# Patient Record
Sex: Female | Born: 1990 | Race: Black or African American | Hispanic: No | Smoking: Former smoker
Health system: Southern US, Community
[De-identification: ages and names within clinical notes are randomized; demographics above are authoritative.]

## PROBLEM LIST (undated history)

## (undated) ENCOUNTER — Emergency Department: Admission: EM | Payer: BLUE CROSS/BLUE SHIELD

## (undated) ENCOUNTER — Inpatient Hospital Stay (HOSPITAL_COMMUNITY): Payer: Self-pay

## (undated) DIAGNOSIS — A749 Chlamydial infection, unspecified: Secondary | ICD-10-CM

## (undated) DIAGNOSIS — O24419 Gestational diabetes mellitus in pregnancy, unspecified control: Secondary | ICD-10-CM

## (undated) DIAGNOSIS — E559 Vitamin D deficiency, unspecified: Secondary | ICD-10-CM

## (undated) DIAGNOSIS — N760 Acute vaginitis: Secondary | ICD-10-CM

## (undated) DIAGNOSIS — N189 Chronic kidney disease, unspecified: Secondary | ICD-10-CM

## (undated) DIAGNOSIS — K219 Gastro-esophageal reflux disease without esophagitis: Secondary | ICD-10-CM

## (undated) DIAGNOSIS — D649 Anemia, unspecified: Secondary | ICD-10-CM

## (undated) DIAGNOSIS — Z8489 Family history of other specified conditions: Secondary | ICD-10-CM

## (undated) DIAGNOSIS — N915 Oligomenorrhea, unspecified: Secondary | ICD-10-CM

## (undated) DIAGNOSIS — B9689 Other specified bacterial agents as the cause of diseases classified elsewhere: Secondary | ICD-10-CM

## (undated) DIAGNOSIS — Z23 Encounter for immunization: Secondary | ICD-10-CM

## (undated) DIAGNOSIS — Z5189 Encounter for other specified aftercare: Secondary | ICD-10-CM

## (undated) DIAGNOSIS — E282 Polycystic ovarian syndrome: Secondary | ICD-10-CM

## (undated) DIAGNOSIS — E785 Hyperlipidemia, unspecified: Secondary | ICD-10-CM

## (undated) DIAGNOSIS — N926 Irregular menstruation, unspecified: Secondary | ICD-10-CM

## (undated) HISTORY — DX: Gastro-esophageal reflux disease without esophagitis: K21.9

## (undated) HISTORY — DX: Irregular menstruation, unspecified: N92.6

## (undated) HISTORY — DX: Chlamydial infection, unspecified: A74.9

## (undated) HISTORY — DX: Hyperlipidemia, unspecified: E78.5

## (undated) HISTORY — DX: Acute vaginitis: N76.0

## (undated) HISTORY — DX: Oligomenorrhea, unspecified: N91.5

## (undated) HISTORY — DX: Encounter for immunization: Z23

## (undated) HISTORY — DX: Vitamin D deficiency, unspecified: E55.9

## (undated) HISTORY — DX: Other specified bacterial agents as the cause of diseases classified elsewhere: B96.89

## (undated) HISTORY — DX: Encounter for other specified aftercare: Z51.89

---
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
## 2009-05-01 ENCOUNTER — Emergency Department: Payer: Self-pay | Admitting: Unknown Physician Specialty

## 2010-04-29 LAB — HM PAP SMEAR: HM Pap smear: NORMAL

## 2010-12-12 ENCOUNTER — Emergency Department: Payer: Self-pay | Admitting: Internal Medicine

## 2013-09-16 ENCOUNTER — Encounter (HOSPITAL_COMMUNITY): Payer: Self-pay

## 2013-09-16 ENCOUNTER — Inpatient Hospital Stay (HOSPITAL_COMMUNITY)
Admission: AD | Admit: 2013-09-16 | Discharge: 2013-09-16 | Disposition: A | Discharge status: Home / Self Care | Payer: 59 | Source: Ambulatory Visit | Attending: Obstetrics & Gynecology | Admitting: Obstetrics & Gynecology

## 2013-09-16 DIAGNOSIS — Z3201 Encounter for pregnancy test, result positive: Secondary | ICD-10-CM | POA: Insufficient documentation

## 2013-09-16 LAB — POCT PREGNANCY, URINE: Preg Test, Ur: POSITIVE — AB

## 2013-09-16 NOTE — MAU Provider Note (Signed)
Attestation of Attending Supervision of Advanced Practitioner (CNM/NP): Evaluation and management procedures were performed by the Advanced Practitioner under my supervision and collaboration.  I have reviewed the Advanced Practitioner's note and chart, and I agree with the management and plan.  HARRAWAY-SMITH, Artist Bloom 2:49 PM

## 2013-09-16 NOTE — MAU Provider Note (Signed)
Ms. Susan Hubbard is a 23 y.o. G1P0 at [redacted]w[redacted]d who presents to MAU today for confirmation of pregnancy. The patient reports +HPT and plans to go to prenatal care in [place]. The patient denies abdominal pain, bleeding or fever today.   BP 117/61  Pulse 79  Temp(Src) 98.7 F (37.1 C) (Oral)  Resp 16  Ht 5\' 3"  (1.6 m)  Wt 184 lb 3.2 oz (83.553 kg)  BMI 32.64 kg/m2  SpO2 100%  LMP 07/21/2013 GENERAL: Well-developed, well-nourished female in no acute distress.  HEENT: Normocephalic, atraumatic.   LUNGS: Effort normal HEART: Regular rate  SKIN: Warm, dry and without erythema PSYCH: Normal mood and affect  Results for orders placed during the hospital encounter of 09/16/13 (from the past 24 hour(s))  POCT PREGNANCY, URINE     Status: Abnormal   Collection Time    09/16/13 12:13 PM      Result Value Ref Range   Preg Test, Ur POSITIVE (*) NEGATIVE    A: Positive pregnancy test  P: Discharge home Bleeding/Ectopic warning signs discussed Patient advised to call OB of choice to start care ASAP Patient may return to MAU as needed or if her condition were to change or worsen  Farris Has, PA-C 09/16/2013 12:50 PM

## 2013-09-16 NOTE — MAU Note (Signed)
Patient states she has had a positive home pregnancy test and wants to have a confirmation. Denies any pain, bleeding, nausea and vomiting.

## 2013-09-16 NOTE — Discharge Instructions (Signed)
Pregnancy, First Trimester The first trimester is the first 3 months your baby is growing inside you. It is important to follow your doctor's instructions. HOME CARE   Do not smoke.  Do not drink alcohol.  Only take medicine as told by your doctor.  Exercise.  Eat healthy foods. Eat regular, well-balanced meals.  You can have sex (intercourse) if there are no other problems with the pregnancy.  Things that help with morning sickness:  Eat soda crackers before getting up in the morning.  Eat 4 to 5 small meals rather than 3 large meals.  Drink liquids between meals, not during meals.  Go to all appointments as told.  Take all vitamins or supplements as told by your doctor. GET HELP RIGHT AWAY IF:   You develop a fever.  You have a bad smelling fluid that is leaking from your vagina.  There is bleeding from the vagina.  You develop severe belly (abdominal) or back pain.  You throw up (vomit) blood. It may look like coffee grounds.  You lose more than 2 pounds in a week.  You gain 5 pounds or more in a week.  You gain more than 2 pounds in a week and you see puffiness (swelling) in your feet, ankles, or legs.  You have severe dizziness or pass out (faint).  You are around people who have [place] measles, chickenpox, or fifth disease.  You have a headache, watery poop (diarrhea), pain with peeing (urinating), or cannot breath right. Document Released: 12/21/2007 Document Revised: 09/26/2011 Document Reviewed: 12/21/2007 [facility] Patient Information 2014 [place], [place].

## 2013-09-23 ENCOUNTER — Emergency Department: Payer: Self-pay | Admitting: Emergency Medicine

## 2013-09-23 LAB — URINALYSIS, COMPLETE
Bacteria: NONE SEEN
Bilirubin,UR: NEGATIVE
Glucose,UR: NEGATIVE mg/dL (ref 0–75)
KETONE: NEGATIVE
Leukocyte Esterase: NEGATIVE
Nitrite: NEGATIVE
PH: 5 (ref 4.5–8.0)
PROTEIN: NEGATIVE
RBC,UR: 7 /HPF (ref 0–5)
Specific Gravity: 1.026 (ref 1.003–1.030)
Squamous Epithelial: 8
WBC UR: 1 /HPF (ref 0–5)

## 2013-09-23 LAB — CBC
HCT: 38.2 % (ref 35.0–47.0)
HGB: 12.1 g/dL (ref 12.0–16.0)
MCH: 28.2 pg (ref 26.0–34.0)
MCHC: 31.6 g/dL — ABNORMAL LOW (ref 32.0–36.0)
MCV: 90 fL (ref 80–100)
PLATELETS: 331 10*3/uL (ref 150–440)
RBC: 4.27 10*6/uL (ref 3.80–5.20)
RDW: 13.1 % (ref 11.5–14.5)
WBC: 11.3 10*3/uL — ABNORMAL HIGH (ref 3.6–11.0)

## 2013-09-23 LAB — HCG, QUANTITATIVE, PREGNANCY: BETA HCG, QUANT.: 119750 m[IU]/mL — AB

## 2013-10-05 ENCOUNTER — Emergency Department: Payer: Self-pay | Admitting: Internal Medicine

## 2013-10-05 LAB — URINALYSIS, COMPLETE
BILIRUBIN, UR: NEGATIVE
Glucose,UR: NEGATIVE mg/dL (ref 0–75)
KETONE: NEGATIVE
Leukocyte Esterase: NEGATIVE
NITRITE: NEGATIVE
Ph: 5 (ref 4.5–8.0)
Protein: NEGATIVE
Specific Gravity: 1.023 (ref 1.003–1.030)
Squamous Epithelial: 15
WBC UR: 2 /HPF (ref 0–5)

## 2013-10-05 LAB — HCG, QUANTITATIVE, PREGNANCY: Beta Hcg, Quant.: 103583 m[IU]/mL — ABNORMAL HIGH

## 2013-10-05 LAB — CBC WITH DIFFERENTIAL/PLATELET
Basophil #: 0.1 10*3/uL (ref 0.0–0.1)
Basophil %: 0.4 %
EOS ABS: 0.1 10*3/uL (ref 0.0–0.7)
Eosinophil %: 0.5 %
HCT: 35.5 % (ref 35.0–47.0)
HGB: 12 g/dL (ref 12.0–16.0)
LYMPHS PCT: 12.6 %
Lymphocyte #: 1.6 10*3/uL (ref 1.0–3.6)
MCH: 29.9 pg (ref 26.0–34.0)
MCHC: 33.9 g/dL (ref 32.0–36.0)
MCV: 88 fL (ref 80–100)
Monocyte #: 0.8 x10 3/mm (ref 0.2–0.9)
Monocyte %: 6.1 %
NEUTROS PCT: 80.4 %
Neutrophil #: 10.2 10*3/uL — ABNORMAL HIGH (ref 1.4–6.5)
Platelet: 263 10*3/uL (ref 150–440)
RBC: 4.02 10*6/uL (ref 3.80–5.20)
RDW: 13.1 % (ref 11.5–14.5)
WBC: 12.6 10*3/uL — ABNORMAL HIGH (ref 3.6–11.0)

## 2014-01-06 ENCOUNTER — Encounter (HOSPITAL_COMMUNITY): Payer: Self-pay

## 2014-03-03 ENCOUNTER — Observation Stay: Payer: Self-pay | Admitting: Obstetrics and Gynecology

## 2014-03-03 LAB — URINALYSIS, COMPLETE
BILIRUBIN, UR: NEGATIVE
Glucose,UR: NEGATIVE mg/dL (ref 0–75)
KETONE: NEGATIVE
Leukocyte Esterase: NEGATIVE
Nitrite: NEGATIVE
Ph: 5 (ref 4.5–8.0)
Protein: NEGATIVE
RBC,UR: 6 /HPF (ref 0–5)
Specific Gravity: 1.031 (ref 1.003–1.030)
Squamous Epithelial: 4
WBC UR: 2 /HPF (ref 0–5)

## 2014-03-04 LAB — FETAL FIBRONECTIN
Appearance: NORMAL
Fetal Fibronectin: NEGATIVE

## 2014-03-05 ENCOUNTER — Observation Stay: Payer: Self-pay

## 2014-03-05 LAB — URINALYSIS, COMPLETE
Bilirubin,UR: NEGATIVE
Blood: NEGATIVE
Glucose,UR: NEGATIVE mg/dL (ref 0–75)
Ketone: NEGATIVE
Leukocyte Esterase: NEGATIVE
Nitrite: NEGATIVE
Ph: 6 (ref 4.5–8.0)
Protein: 30
RBC,UR: 3 /HPF (ref 0–5)
Specific Gravity: 1.026 (ref 1.003–1.030)
Squamous Epithelial: 17
WBC UR: 3 /HPF (ref 0–5)

## 2014-03-05 LAB — URINE CULTURE

## 2014-03-10 ENCOUNTER — Observation Stay: Payer: Self-pay | Admitting: Certified Nurse Midwife

## 2014-03-10 LAB — URINALYSIS, COMPLETE
BLOOD: NEGATIVE
Bilirubin,UR: NEGATIVE
Glucose,UR: NEGATIVE mg/dL (ref 0–75)
Ketone: NEGATIVE
LEUKOCYTE ESTERASE: NEGATIVE
NITRITE: NEGATIVE
Ph: 5 (ref 4.5–8.0)
Protein: 30
SPECIFIC GRAVITY: 1.027 (ref 1.003–1.030)
WBC UR: 3 /HPF (ref 0–5)

## 2014-03-14 LAB — URINE CULTURE

## 2014-04-01 ENCOUNTER — Observation Stay: Payer: Self-pay | Admitting: Obstetrics and Gynecology

## 2014-04-01 LAB — CBC WITH DIFFERENTIAL/PLATELET
COMMENT - H1-COM1: NORMAL
Comment - H1-Com2: NORMAL
HCT: 38.2 % (ref 35.0–47.0)
HGB: 12.4 g/dL (ref 12.0–16.0)
Lymphocytes: 22 %
MCH: 29 pg (ref 26.0–34.0)
MCHC: 32.6 g/dL (ref 32.0–36.0)
MCV: 89 fL (ref 80–100)
Monocytes: 4 %
PLATELETS: 272 10*3/uL (ref 150–440)
RBC: 4.29 10*6/uL (ref 3.80–5.20)
RDW: 13.1 % (ref 11.5–14.5)
Segmented Neutrophils: 74 %
WBC: 8.7 10*3/uL (ref 3.6–11.0)

## 2014-04-01 LAB — URINALYSIS, COMPLETE
BACTERIA: NONE SEEN
Bilirubin,UR: NEGATIVE
Blood: NEGATIVE
Glucose,UR: NEGATIVE mg/dL (ref 0–75)
KETONE: NEGATIVE
LEUKOCYTE ESTERASE: NEGATIVE
Nitrite: NEGATIVE
PH: 8 (ref 4.5–8.0)
Protein: NEGATIVE
RBC,UR: NONE SEEN /HPF (ref 0–5)
Specific Gravity: 1.012 (ref 1.003–1.030)

## 2014-04-12 ENCOUNTER — Observation Stay: Payer: Self-pay

## 2014-04-12 LAB — CBC WITH DIFFERENTIAL/PLATELET
BASOS PCT: 0.1 %
Basophil #: 0 10*3/uL (ref 0.0–0.1)
EOS ABS: 0 10*3/uL (ref 0.0–0.7)
EOS PCT: 0.4 %
HCT: 35.8 % (ref 35.0–47.0)
HGB: 12.1 g/dL (ref 12.0–16.0)
Lymphocyte #: 1.2 10*3/uL (ref 1.0–3.6)
Lymphocyte %: 16 %
MCH: 29.6 pg (ref 26.0–34.0)
MCHC: 33.8 g/dL (ref 32.0–36.0)
MCV: 88 fL (ref 80–100)
MONOS PCT: 7.1 %
Monocyte #: 0.5 x10 3/mm (ref 0.2–0.9)
NEUTROS ABS: 5.5 10*3/uL (ref 1.4–6.5)
Neutrophil %: 76.4 %
Platelet: 250 10*3/uL (ref 150–440)
RBC: 4.09 10*6/uL (ref 3.80–5.20)
RDW: 13.1 % (ref 11.5–14.5)
WBC: 7.2 10*3/uL (ref 3.6–11.0)

## 2014-04-12 LAB — COMPREHENSIVE METABOLIC PANEL
ALT: 23 U/L
AST: 17 U/L (ref 15–37)
Albumin: 2.5 g/dL — ABNORMAL LOW (ref 3.4–5.0)
Alkaline Phosphatase: 138 U/L — ABNORMAL HIGH
Anion Gap: 8 (ref 7–16)
BUN: 7 mg/dL (ref 7–18)
Bilirubin,Total: 0.3 mg/dL (ref 0.2–1.0)
CREATININE: 0.62 mg/dL (ref 0.60–1.30)
Calcium, Total: 8.4 mg/dL — ABNORMAL LOW (ref 8.5–10.1)
Chloride: 107 mmol/L (ref 98–107)
Co2: 23 mmol/L (ref 21–32)
EGFR (African American): >60
EGFR (Non-African Amer.): >60
GLUCOSE: 89 mg/dL (ref 65–99)
Osmolality: 273 (ref 275–301)
POTASSIUM: 3.7 mmol/L (ref 3.5–5.1)
Sodium: 138 mmol/L (ref 136–145)
TOTAL PROTEIN: 6.1 g/dL — AB (ref 6.4–8.2)

## 2014-04-12 LAB — URINALYSIS, COMPLETE
BLOOD: NEGATIVE
Bacteria: NONE SEEN
Bilirubin,UR: NEGATIVE
GLUCOSE, UR: NEGATIVE mg/dL (ref 0–75)
Ketone: NEGATIVE
Leukocyte Esterase: NEGATIVE
NITRITE: NEGATIVE
PH: 7 (ref 4.5–8.0)
Protein: NEGATIVE
Specific Gravity: 1.016 (ref 1.003–1.030)
Squamous Epithelial: 8
WBC UR: 1 /HPF (ref 0–5)

## 2014-04-14 LAB — URINE CULTURE

## 2014-04-21 ENCOUNTER — Observation Stay: Payer: Self-pay

## 2014-04-21 LAB — MRSA PCR SCREENING

## 2014-04-27 ENCOUNTER — Observation Stay: Payer: Self-pay

## 2014-04-29 ENCOUNTER — Observation Stay: Payer: Self-pay

## 2014-04-30 ENCOUNTER — Inpatient Hospital Stay: Payer: Self-pay | Admitting: Obstetrics and Gynecology

## 2014-04-30 LAB — CBC WITH DIFFERENTIAL/PLATELET
BASOS ABS: 0 10*3/uL (ref 0.0–0.1)
Basophil %: 0.1 %
EOS ABS: 0.1 10*3/uL (ref 0.0–0.7)
EOS PCT: 0.6 %
HCT: 40.1 % (ref 35.0–47.0)
HGB: 12.9 g/dL (ref 12.0–16.0)
LYMPHS ABS: 2 10*3/uL (ref 1.0–3.6)
Lymphocyte %: 20.6 %
MCH: 28.8 pg (ref 26.0–34.0)
MCHC: 32.2 g/dL (ref 32.0–36.0)
MCV: 89 fL (ref 80–100)
Monocyte #: 0.8 x10 3/mm (ref 0.2–0.9)
Monocyte %: 7.7 %
Neutrophil #: 7.1 10*3/uL — ABNORMAL HIGH (ref 1.4–6.5)
Neutrophil %: 71 %
PLATELETS: 303 10*3/uL (ref 150–440)
RBC: 4.49 10*6/uL (ref 3.80–5.20)
RDW: 13.6 % (ref 11.5–14.5)
WBC: 9.9 10*3/uL (ref 3.6–11.0)

## 2014-05-01 LAB — GC/CHLAMYDIA PROBE AMP

## 2014-05-02 LAB — HEMATOCRIT: HCT: 31.6 % — ABNORMAL LOW (ref 35.0–47.0)

## 2014-05-03 LAB — PATHOLOGY REPORT

## 2014-05-19 ENCOUNTER — Encounter (HOSPITAL_COMMUNITY): Payer: Self-pay

## 2014-07-22 ENCOUNTER — Encounter (HOSPITAL_COMMUNITY): Payer: Self-pay | Admitting: *Deleted

## 2014-11-08 NOTE — Op Note (Signed)
PATIENT NAME:  Susan Hubbard, WEIHER MR#:  [id number] DATE OF BIRTH:  19-Oct-1990  DATE OF PROCEDURE:  05/01/2014  PREOPERATIVE DIAGNOSES: 97.  A 24 year old gravida 1 at 40 weeks and 4 days' gestation.  2.  Premature rupture of membranes.  3.  Fetal intolerance to labor.   POSTOPERATIVE DIAGNOSIS: 46.  A 24 year old gravida 1 at 40 weeks and 4 days' gestation.  2.  Premature rupture of membranes.  3.  Fetal intolerance to labor.   OPERATION PERFORMED: Primary low transverse cesarean section via Pfannenstiel skin incision.   ANESTHESIA USED: Spinal.   PRIMARY SURGEON: Stoney Bang. Georgianne Fick, MD.   ASSISTANTRulon Sera. Brothers, CNM.   PREOPERATIVE ANTIBIOTICS: 2 grams Ancef.   ESTIMATED BLOOD LOSS: 800 mL.   OPERATIVE FLUIDS: 700 mL.   DRAINS OR TUBES: Foley to gravity drainage, On-Q catheter system.   IMPLANTS: None.   COMPLICATIONS: None.   SPECIMENS REMOVED: Placenta.   FINDINGS: Normal tubes, ovaries, and uterus. Fetus was noted to be in the OA position. Delivery resulted in the birth of a liveborn female infant weighing 3730 grams, 6 pounds 11 ounces, Apgars 9 and 9. The placenta was noted to be somewhat small for gestational age and was therefore sent for pathology given the recurrent late decelerations that were noted during the labor course.   PATIENT CONDITION FOLLOWING PROCEDURE: Stable.   PROCEDURE IN DETAIL: Risks, benefits, and alternatives of the procedure were discussed with the patient prior to proceeding to the Operating Room. The patient had previously had one prolonged deceleration during the course of her labor. We discussed implications of this; however, her overall strip was a category 2 tracing. As we continued to titrate up the Pitocin the patient began displaying recurrent late decelerations without cervical change. Discussion was had as far as appropriate route for delivery and a mutual decision was made to proceed with low transverse C section for delivery. The  patient was taken to the Operating Room where she was administered spinal anesthesia. She was positioned in the supine position, prepped and draped in the usual sterile fashion. A timeout was performed. The level of anesthetic was checked prior to proceeding with the case. A Pfannenstiel skin incision was made 2 cm above the pubic symphysis, carried down sharply to the level of the rectus fascia using a knife. The fascia was incised in the midline using a scalpel and the fascial incision was extended using Mayo scissors. The superior border of the rectus fascia was then grasped with 2 Kocher clamps. The underlying rectus muscles were dissected off the fascia bluntly. The median raphe was incised using Mayo scissors. The inferior border of the rectus fascia was dissected off the rectus muscle in a similar fashion. The midline was then identified. The peritoneal cavity was entered bluntly. The peritoneal incision was then extended using manual traction. A bladder blade was placed. Low transverse incision was scored on the lower uterine segment. The uterus was entered bluntly using the operator's fingers and the hysterotomy incision was extended using manual traction. Upon placing the operator's hand into the hysterotomy incision, the infant was noted to be in the OA position. The vertex was grasped, flexed, brought to the incision, delivered atraumatically using fundal pressure. The remainder of the body delivered with ease. There were no nuchal cords body cords noted. The infant was suctioned, cord was clamped and cut and the infant was passed to the awaiting pediatricians. Placenta was delivered using manual extraction. The only notable thing noted on the  placenta was that it was slightly small for gestational age. The uterus was then exteriorized, wiped clean of clots and debris using 2 moist laps and repaired in a 2 layer closure with the first being a running locked, the second a vertical imbricating. The uterus  was then returned to the abdomen. The peritoneal gutters were wiped clean of clots and debris. The hysterotomy incision was reinspected and noted to be hemostatic. The peritoneal opening was closed using a 2-0 Vicryl in a running fashion. The rectus muscles were reapproximated in the midline using 2-0 Vicryl mattress stitch. The On-Q catheters were then placed subfascially per the usual protocol. The catheters were introduced through the trocars and the trocars were then removed. The fascia was closed using a looped #1 PDS in a running fashion. The subcutaneous tissue was irrigated. Hemostasis was achieved using the Bovie. The subcutaneous dead space was closed using a 2-0 chromic 53 T-needle. Following this, the skin was closed using staples. Sponge, needle, and instrument counts were correct x 2. The patient tolerated the procedure well and was taken to the recovery room in stable condition.    ____________________________ Stoney Bang. Georgianne Fick, MD ams:at D: 05/01/2014 02:43:10 ET T: 05/01/2014 11:36:05 ET JOB#: [id number]  cc: Stoney Bang. Georgianne Fick, MD, <Dictator> Conan Bowens Madelon Lips MD ELECTRONICALLY SIGNED 05/15/2014 13:32

## 2014-11-25 NOTE — H&P (Signed)
L&D Evaluation:  History:  HPI 24 year old Susan Hubbard, G2 P00010 with EDD 04/27/2014 by LMP and 9wk ultrasound presents at [redacted] weeks GA with c/o irregular bur more frequent ctxs since 0200 this AM  and sacral back pain. Denies LOF, VB or decreased FM. PNC at [facility] notable for early entry to care, BMI 31 with normal early 1 hour, elevated 1 hr with normal 3 hour. Had a UTI positive for MRSA with negative TOC and negative nasal swab. Dr Ola Spurr, ID, consulted- no further cultures needed. Received TDAP on 02/19/14. LABS: O POS, RI, VI, GBS positive.   Presents with back pain, contractions   Patient's Medical History BMI>31/ PCOS/ hypercholesterolemia   Patient's Surgical History Elective AB   Medications Pre Natal Vitamins   Allergies NKDA   Social History former smoker   Family History Non-Contributory   ROS:  ROS see HPI   Exam:  Vital Signs 124/68   General c/o back pain   Mental Status clear   Abdomen gravid, tender with contractions   Estimated Fetal Weight Average for gestational age   Fetal Position cephalic   Pelvic no external lesions, closed/25-50%/-1 with contraction (no change over 2 hours and after ambulation   Mebranes Intact   FHT normal rate with no decels, 140s with accels to 160s, CAT1   Ucx irregular, q6-17 min apart   Skin dry   Impression:  Impression IUP at 40 weeks with prodromal vs false labor.   Plan:  Plan DC home with labor precautions and rec for comfort measures. FU scheduled at [facility] if NIL.   Electronic Signatures: Karene Fry (CNM)  (Signed 11-Oct-15 18:40)  Authored: L&D Evaluation   Last Updated: 11-Oct-15 18:40 by Karene Fry (CNM)

## 2014-11-25 NOTE — H&P (Signed)
L&D Evaluation:  History:  HPI 24 year old BF, G2 P00010 with EDD 04/27/2014 by LMP and 9wk ultrasound presents at 33 weeks with c/o low abdominal cramping starting at 1530 while she was at school. She reports that she has not eaten or drink anything all day long while at school. Reports pain has decreased with resting in bed here. Pt was evaluated 8/18 and 8/21 for similar complaints. FFN negative and cervix was closed. LABS: O POS, VI, RI. She also reports some light pink spotting to which she has had for several weeks during this pregnancy. She denies urinary complaints, or vaginal discharge.   Presents with abdominal pain   Patient's Medical History BMI>31/ PCOS/ hypercholesterolemia   Patient's Surgical History Elective AB   Medications Pre Natal Vitamins   Allergies NKDA   Social History former smoker   Family History Non-Contributory   ROS:  ROS see HPI   Exam:  Vital Signs stable   General no apparent distress   Mental Status clear   Abdomen gravid, non-tender   Estimated Fetal Weight Average for gestational age   42 Intact   FHT normal rate with no decels, baseline 130-140's + accels   FHT Description Cat 1   Ucx irritability noted, an occassional ctx seen   Skin dry, no lesions, no rashes   Lymph no lymphadenopathy   Impression:  Impression IUP at 33 weeks with abdominal cramping   Plan:  Plan EFM/NST, monitor contractions and for cervical change, PO fluids   Comments Encouraged continued PO hydration PTL precautions reviewed   Follow Up Appointment already scheduled. Wednesday at [place]: Louisa Second (CNM)  (Signed 24-Aug-15 19:27)  Authored: L&D Evaluation   Last Updated: 24-Aug-15 19:27 by Louisa Second (CNM)

## 2014-11-25 NOTE — H&P (Signed)
L&D Evaluation:  History Expanded:  HPI 24 year old BF, G2 P00010 with EDD 04/27/2014 by LMP and 9wk ultrasound presents at [redacted]w[redacted]d with c/o contractions and urge to push. Denies LOF, VB or decreased FM. PNC at [facility] notable for early entry to care, BMI 31 with normal early 1 hour, elevated 1 hr with normal 3 hour. Received TDAP on 02/19/14.   Blood Type (Maternal) O positive   Group B Strep Results Maternal (Result >5wks must be treated as unknown) positive   Maternal HIV Negative   Maternal Syphilis Ab Nonreactive   Maternal Varicella Immune   Rubella Results (Maternal) immune   Maternal T-Dap Immune   Presents with abdominal pain   Patient's Medical History BMI>31/ PCOS/ hypercholesterolemia   Patient's Surgical History Elective AB   Medications Pre Natal Vitamins   Allergies NKDA   Social History former smoker   Family History Non-Contributory   ROS:  ROS see HPI   Exam:  Vital Signs stable   General no apparent distress   Mental Status clear   Abdomen gravid, non-tender   Estimated Fetal Weight Average for gestational age   Edema no edema   Pelvic no external lesions, cervix closed and thick   Mebranes Intact   FHT normal rate with no decels, baseline 145-155, + accelerations, no decels   Ucx irregular   Skin dry   Impression:  Impression IUP at 39wk, early labor   Plan:  Plan discharge   Comments Labor precautions  Prior alert received for +MRSA urine culture. Repeat urine culture negative. Per Infection Control, pt will need a negative nasal swab to be cleared from isolation.   Electronic Signatures: Ander Purpura (CNM)  (Signed 05-Oct-15 16:05)  Authored: L&D Evaluation   Last Updated: 05-Oct-15 16:05 by Ander Purpura (CNM)

## 2014-11-25 NOTE — H&P (Signed)
L&D Evaluation:  History Expanded:  HPI 24 year old BF, G2 P00010 with EDD 04/27/2014 by LMP and 9wk ultrasound presents at 37w 6d with c/o low back pain and contractions. Denies LOF, VB or decreased FM. Denies sick contacts. No respiratory sx.  PNC at [facility] notable for early entry to care, BMI 31 with normal early 1 hour, elevated 1 hr with normal 3 hour. Received TDAP on 02/19/14.  Pt was evaluated on 9/15 for DFM and cramping, FHR with tachycardia - resolved with IV fluid bolus.   Blood Type (Maternal) O positive   Group B Strep Results Maternal (Result >5wks must be treated as unknown) positive   Maternal HIV Negative   Maternal Syphilis Ab Nonreactive   Maternal Varicella Immune   Rubella Results (Maternal) immune   Maternal T-Dap Immune   Presents with abdominal pain   Patient's Medical History BMI>31/ PCOS/ hypercholesterolemia   Patient's Surgical History Elective AB   Medications Pre Natal Vitamins   Allergies NKDA   Social History former smoker   Family History Non-Contributory   ROS:  ROS see HPI   Exam:  Vital Signs T: 99.5, BP stable, pulse 90s   General no apparent distress   Mental Status clear   Abdomen gravid, non-tender   Estimated Fetal Weight Average for gestational age   Edema no edema   Pelvic no external lesions, cervix closed and thick   Mebranes Intact   FHT normal rate with no decels, baseline initially 180, to 160s after IV fluid bolus, moderate variability   Ucx q 5-10 min   Skin dry   Impression:  Impression IUP at [redacted]w[redacted]d, fetal tachycardia, mild temperature, LBP   Plan:  Comments Fetal tachycardia - IV fluid bolus, CBC, CMP, UA  Prior records stating 10,000 CFU/ML urine culture +MRSA. Pt had not been treated for this. I spoke with Dr Ola Spurr regarding results. Given low colony count, would not recommend treatment at this time. Will repeat urine culture regardless of urinalysis results. Question accuracy of results.    Electronic Signatures: Ashrith Sagan, Rulon Sera (CNM)  (Signed 26-Sep-15 19:32)  Authored: L&D Evaluation   Last Updated: 26-Sep-15 19:32 by Ander Purpura (CNM)

## 2014-11-25 NOTE — H&P (Signed)
L&D Evaluation:  History:  HPI 24 year old BF, G2 P00010 with EDD 04/27/2014 by LMP and 9wk ultrasound presents at 40.[redacted] weeks GA with c/o  ctxs starting yesterday and sacral back pain. She was seen on l7d on 04/27/14 for reports of contractions and back pain as well and was sent homed after the contractions did not make any cervical change. Denies LOF, VB or decreased FM. PNC at [facility] notable for early entry to care, BMI 31 with normal early 1 hour, elevated 1 hr with normal 3 hour. Had a UTI positive for MRSA with negative TOC and negative nasal swab. Dr Ola Spurr, ID, consulted- no further cultures needed. Received TDAP on 02/19/14. LABS: O POS, RI, VI, GBS positive.   Presents with back pain, contractions   Patient's Medical History BMI>31/ PCOS/ hypercholesterolemia   Patient's Surgical History Elective AB   Medications Pre Natal Vitamins   Allergies NKDA   Social History former smoker   Family History Non-Contributory   ROS:  ROS see HPI   Exam:  Vital Signs stable   General c/o back pain   Mental Status clear   Abdomen gravid, tender with contractions   Estimated Fetal Weight Average for gestational age   Fetal Position cephalic   Pelvic no external lesions, 2/754/-3 with contraction (no change over 3 hours and after ambulation)   Mebranes Intact   FHT normal rate with no decels, 140s with +accels, some early decels when first placed on the monitor, moderate variability. CAT1   Ucx irregular   Skin dry, no lesions, no rashes   Impression:  Impression reactive NST, IUP at 40.2 weeks with prodromal vs false labor.   Plan:  Plan Therapeutic rest offered- pt declined. DC home with labor precautions and rec for comfort measures. FU scheduled at [facility] if NIL.   Follow Up Appointment need to schedule   Electronic Signatures: Louisa Second (CNM)  (Signed 13-Oct-15 05:10)  Authored: L&D Evaluation   Last Updated: 13-Oct-15 05:10 by Louisa Second (CNM)

## 2014-11-25 NOTE — H&P (Signed)
L&D Evaluation:  History Expanded:  HPI 24 yo G2 P0010 with EDD 04/27/2014 by LMP and 9wk ultrasound presents at [redacted]w[redacted]d with c/o contractions. Denies LOF, VB or decreased FM. PNC at [facility] notable for early entry to care, BMI 31 with normal early 1 hour, elevated 1 hr with normal 3 hour. Received TDAP on 02/19/14. Prior report of +MRSA urine culture, suspected to be in error as this was untreated and repeat urine culture negative, nasal swab was also negative on a prior admission.   Blood Type (Maternal) O positive   Group B Strep Results Maternal (Result >5wks must be treated as unknown) positive   Maternal HIV Negative   Maternal Syphilis Ab Nonreactive   Maternal Varicella Immune   Rubella Results (Maternal) immune   Maternal T-Dap Immune   Presents with abdominal pain   Patient's Medical History BMI>31/ PCOS/ hypercholesterolemia   Patient's Surgical History Elective AB   Medications Pre Natal Vitamins   Allergies NKDA   Social History former smoker   Family History Non-Contributory   ROS:  ROS see HPI   Exam:  Vital Signs stable   General no apparent distress   Mental Status clear   Abdomen gravid, non-tender   Estimated Fetal Weight Average for gestational age   Edema no edema   Pelvic no external lesions, 2/50/-2   Mebranes Ruptured, rupture occured after admission at 1636, forebag palpated   Description clear   FHT normal rate with no decels, category 1 tracing, baseline 150, minimal -moderate variability, occasional early or variable appearing deceleration   Ucx regular   Skin dry   Impression:  Impression IUP at [redacted]w[redacted]d, SROM, early labor   Plan:  Plan monitor contractions and for cervical change, antibiotics for GBBS prophylaxis   Comments Reassess once GBS adequately treated, consider rupturing forebag or Pitocin   Electronic Signatures: Carlinda Ohlson, Rulon Sera (CNM)  (Signed 14-Oct-15 17:10)  Authored: L&D Evaluation   Last Updated:  14-Oct-15 17:10 by Ander Purpura (CNM)

## 2014-11-25 NOTE — H&P (Signed)
L&D Evaluation:  History Expanded:  HPI 24 year old BF, G2 P00010 with EDD 04/27/2014 by LMP and 9wk ultrasound presents at 32 2/7 weeks with c/o low abdominal cramping starting at 1700 this evening. Reports pain has decreased with resting in bed here. Pt was evaluated 8/18 early am for similar complaints. FFN negative and cervix was closed. LABS: O POS, VI, RI.   Maternal HIV Negative   Maternal Syphilis Ab Nonreactive   Presents with abdominal pain   Patient's Medical History BMI>31/ PCOS/ hypercholesterolemia   Patient's Surgical History Elective AB   Medications Pre Natal Vitamins   Allergies NKDA   Social History former smoker   Family History Non-Contributory   ROS:  ROS see HPI   Exam:  Vital Signs stable   Urine Protein UA: trace bacteria, 1+ protein   General no apparent distress   Mental Status clear   Abdomen gravid, non-tender   Estimated Fetal Weight Average for gestational age   Pelvic no external lesions, cervix closed and thick   Mebranes Intact   FHT normal rate with no decels, baseline 160 initially, to 150 after po hydration, + accelerations, no decels, monitored >1 hour   FHT Description Cat 1   Ucx absent   Skin dry   Impression:  Impression IUP at 32 weeks with abdominal cramping, resolved   Plan:  Comments Encouraged continued PO hydration PTL precautions reviewed   Follow Up Appointment already scheduled. next week at [place]: Ander Purpura (CNM)  (Signed 19-Aug-15 23:15)  Authored: L&D Evaluation   Last Updated: 19-Aug-15 23:15 by Ander Purpura (CNM)

## 2014-11-25 NOTE — H&P (Signed)
L&D Evaluation:  History:  HPI -CC: decreased FM, low belly pressure -HPI: 24 y/o G2P0010 @ 36/2 (L=9) with above CC. Preg c/b BMI 31. No VB, LOF or regular UCs   Patient's Medical History No Chronic Illness   Medications none   Allergies NKDA   Social History none   Exam:  General no apparent distress   Abdomen gravid, non-tender   Pelvic 0/50/-3   FHT 180 baseline; prior baseline 150 with +accels. currently still with mod variability and w/o decels   FHT Description Tachycardia   Ucx irregular   Impression:  Impression negative EOL but with current fetal tachycardia   Plan:  Comments *IUP: will continue to monitor to see if baseline comes back down to normal. If doesn't come back down in 58minutes will initiate tachycardia work up with IVFs and possible lab work up; pt is already doing some PO hydrating *EOL: negative.   Follow Up Appointment already scheduled. 9-17   Electronic Signatures: Aletha Halim (MD)  (Signed 15-Sep-15 12:12)  Authored: L&D Evaluation   Last Updated: 15-Sep-15 12:12 by Aletha Halim (MD)

## 2014-11-25 NOTE — H&P (Signed)
L&D Evaluation:  History:  HPI 24 year old BF, G2 P00010 with EDC=04/27/2014 by LMP=07/21/2013 and 9wk3 day ultrasound presents to L&Dat 32 1/7 weeks with c/o sharp intermittent lower abdominal pains since 9 PM. Patient was at [facility] when pain began. Has had some urinary frequency x 2 weeks and ?bad odor to urine. Denies dysuria, discharge, itching, VB, LOF,  or fever. Pain does not cause nausea. Had one normal stool today. Has a hx of kidney stones. Baby active. PNC at [facility] begun in first trimester with early scans confirming dates. PNC also remarkable for first trimester bleeding with a [facility] noted on ultrasound, obesity (BMI>31), an elevated 1 hr GTT at 28 weeks with a normal 3 hr GTT and a normal anatomy scan. Had presented to office last week with some spotting/cramping x 1 day, but cx was closed. LABS: O POS, VI, RI.   Presents with abdominal pain   Patient's Medical History BMI>31/ PCOS/ hypercholesterolemia   Patient's Surgical History Elective AB   Medications Pre Natal Vitamins   Allergies NKDA   Social History former smoker   Family History Non-Contributory   ROS:  ROS see HPI   Exam:  Vital Signs stable   Urine Protein negative dipstick, +1 blood and 6RBC/HPF. UA otherwise WNL   General no apparent distress   Mental Status clear   Abdomen gravid, non-tender, uterus palpated soft when experienced pain-but  was having some contractions.   Estimated Fetal Weight Average for gestational age, 4#2oz   Fetal Position cephalic on US   Pelvic no external lesions, FFN done. small white discharge. CX: closed/long/OOP   Mebranes Intact   FHT normal rate with no decels, 130s with accels to 160s to 170s   FHT Description Cat 1   Ucx irregular, initially had uterine irritability which seemed to space out with po hydration, then increase again after cx exam. Had 4 ctxs in the last 10 minutes.   Skin dry   Impression:  Impression IUP at 32 2/7 weeks with abdominal pain  and preterm contractions. Reactive FHT   Plan:  Plan FFN returned negative. Terbutaline 0.25mg m subcut x1. Continue po hydration.   Electronic Signatures: Karene Fry (CNM)  (Signed 18-Aug-15 01:27)  Authored: L&D Evaluation   Last Updated: 18-Aug-15 01:27 by Karene Fry (CNM)

## 2015-02-05 ENCOUNTER — Ambulatory Visit: Payer: Self-pay | Admitting: Physician Assistant

## 2015-02-05 DIAGNOSIS — N200 Calculus of kidney: Secondary | ICD-10-CM

## 2015-02-05 DIAGNOSIS — L709 Acne, unspecified: Secondary | ICD-10-CM | POA: Insufficient documentation

## 2015-02-05 DIAGNOSIS — L309 Dermatitis, unspecified: Secondary | ICD-10-CM | POA: Insufficient documentation

## 2015-02-05 DIAGNOSIS — G479 Sleep disorder, unspecified: Secondary | ICD-10-CM | POA: Insufficient documentation

## 2015-02-05 DIAGNOSIS — E559 Vitamin D deficiency, unspecified: Secondary | ICD-10-CM | POA: Insufficient documentation

## 2015-02-05 DIAGNOSIS — N926 Irregular menstruation, unspecified: Secondary | ICD-10-CM | POA: Insufficient documentation

## 2015-02-05 DIAGNOSIS — Z8742 Personal history of other diseases of the female genital tract: Secondary | ICD-10-CM | POA: Insufficient documentation

## 2015-02-05 DIAGNOSIS — E669 Obesity, unspecified: Secondary | ICD-10-CM | POA: Insufficient documentation

## 2015-02-05 DIAGNOSIS — Z720 Tobacco use: Secondary | ICD-10-CM | POA: Insufficient documentation

## 2015-02-05 DIAGNOSIS — E7800 Pure hypercholesterolemia, unspecified: Secondary | ICD-10-CM | POA: Insufficient documentation

## 2015-02-06 ENCOUNTER — Ambulatory Visit: Payer: Self-pay | Admitting: Physician Assistant

## 2015-02-16 ENCOUNTER — Ambulatory Visit: Payer: 59 | Admitting: Physician Assistant

## 2015-02-20 ENCOUNTER — Ambulatory Visit: Payer: 59 | Admitting: Physician Assistant

## 2015-04-14 ENCOUNTER — Ambulatory Visit: Payer: 59 | Admitting: Family Medicine

## 2015-04-15 ENCOUNTER — Ambulatory Visit (INDEPENDENT_AMBULATORY_CARE_PROVIDER_SITE_OTHER): Payer: 59

## 2015-04-15 ENCOUNTER — Ambulatory Visit (INDEPENDENT_AMBULATORY_CARE_PROVIDER_SITE_OTHER): Payer: 59 | Admitting: Podiatry

## 2015-04-15 DIAGNOSIS — M722 Plantar fascial fibromatosis: Secondary | ICD-10-CM

## 2015-04-15 MED ORDER — METHYLPREDNISOLONE 4 MG PO TBPK: ORAL_TABLET | ORAL | Status: DC

## 2015-04-16 ENCOUNTER — Ambulatory Visit: Payer: 59 | Admitting: Podiatry

## 2015-04-16 NOTE — Progress Notes (Signed)
She presents today as an inpatient with a chief complaint of pain to bilateral foot. She states the one on for quite some time and seems to be worsening. She states that it is particularly bad in the mornings when she gets out of bed and then again towards the end of the day. She wears a rubber soled Dansko-like shoe work at all times. She has an 81-month-old baby.   Objective: vital signs are stable she is alert and oriented 3 in no acute distress. I have reviewed her past medical history medications allergies surgery social history. Pulses are strongly palpable bilaterally neurologic sensorium is intact per Semmes-Weinstein monofilament. Deep tendon reflexes are intact bilateral muscle strength is intact bilateral. Orthopedic evaluation demonstrates mild to moderate pes planus flexible in nature bilateral. She has mild tenderness on palpation of the medial calcaneal tubercle in the plantar medial arch as well as the posterior tibial tendon. She has mild gastrocnemius equinus. Cutaneous evaluation demonstrates supple well-hydrated cutis no erythema edema saline as drainage or odor. Radiographs 3 views taken in the office today demonstrate only mild plantar fasciitis and mild pes planus.    Assessment: 24 year old female with mild pes planus and plantar fasciitis left greater than right also demonstrates mild posterior tibial tendinitis left.  Plan: We discussed the etiology pathology conservative versus surgical therapies. I recommended a different type of shoe. When she was wearing his way too flexible for her foot type and she needs more of a rigid control. I provided her with examples of shoe types and discussed these with her. I also started her on a Sterapred dose pack to be followed by meloxicam. I'll follow-up with her in 1 month or as needed.    Roselind Messier DPM

## 2015-05-18 ENCOUNTER — Ambulatory Visit (INDEPENDENT_AMBULATORY_CARE_PROVIDER_SITE_OTHER): Payer: 59 | Admitting: Family Medicine

## 2015-05-18 ENCOUNTER — Other Ambulatory Visit: Payer: Self-pay

## 2015-05-18 ENCOUNTER — Encounter: Payer: Self-pay | Admitting: Family Medicine

## 2015-05-18 VITALS — BP 124/72 | HR 91 | Temp 98.5°F | Resp 18 | Ht 63.0 in | Wt 216.4 lb

## 2015-05-18 DIAGNOSIS — M545 Low back pain, unspecified: Secondary | ICD-10-CM

## 2015-05-18 DIAGNOSIS — M25532 Pain in left wrist: Secondary | ICD-10-CM | POA: Diagnosis not present

## 2015-05-18 DIAGNOSIS — O039 Complete or unspecified spontaneous abortion without complication: Secondary | ICD-10-CM | POA: Insufficient documentation

## 2015-05-18 MED ORDER — DICLOFENAC SODIUM 75 MG PO TBEC: 75.0000 mg | DELAYED_RELEASE_TABLET | Freq: Two times a day (BID) | ORAL | Status: DC

## 2015-05-18 NOTE — Progress Notes (Signed)
Patient ID: Susan Hubbard, female   DOB: 20-Dec-1990, 24 y.o.   MRN: [id number] Name: Susan Hubbard   MRN: [id number]    DOB: May 31, 1991   Date:05/18/2015       Progress Note  Subjective  Chief Complaint  Chief Complaint  Patient presents with  . Wrist Pain    left wrist     Wrist Pain  The pain is present in the left wrist (shooting pain with certain movements or picking up her 71 year old daughter). This is a new problem. The current episode started more than 1 month ago. There has been no history of extremity trauma. The problem occurs constantly. The problem has been gradually worsening. Quality: only pain occurs with movement of wrist and some numbness at night. Associated symptoms include tingling. Associated symptoms comments: Commonly occurs at night. Work typing on the job..  Back Pain This is a new problem. The current episode started more than 1 year ago. The problem occurs intermittently. The problem has been waxing and waning since onset. The pain is present in the lumbar spine. The quality of the pain is described as aching (at rest - sharper with movement). The pain does not radiate. The symptoms are aggravated by bending, coughing and twisting. Stiffness is present in the morning. Associated symptoms include tingling. (Onset 6 months after childbirth with epidural attempts. Finally had to have spinal anesthesia for C-section.)    Past Surgical History  Procedure Laterality Date  . Cesarean section     Family History  Problem Relation Age of Onset  . Diabetes Mother     type II  . Hyperlipidemia Mother   . Hypertension Mother   . GER disease Mother   . Sleep apnea Mother   . Hypertension Father   . Hyperlipidemia Maternal Grandmother   . Diabetes Maternal Grandmother     Type II  . Cancer Maternal Grandfather     Lung cancer  . Hypertension Paternal Grandmother   . Cancer Paternal Grandfather   . Diabetes Paternal Grandfather   . Hyperlipidemia Paternal  Grandfather   . Cataracts Paternal Grandfather    Past Medical History  Diagnosis Date  . Hyperlipidemia    Social History  Substance Use Topics  . Smoking status: Former Research scientist (life sciences)  . Smokeless tobacco: Never Used     Comment: Recently quit tobacco use; patient states that she quit smoking 11/2011  . Alcohol Use: No   No current outpatient prescriptions on file.  Allergies  Allergen Reactions  . Augmentin [Amoxicillin-Pot Clavulanate] Nausea And Vomiting   Review of Systems  Constitutional: Negative.   HENT: Negative.   Eyes: Negative.   Respiratory: Negative.   Cardiovascular: Negative.   Gastrointestinal: Negative.   Genitourinary: Negative.   Musculoskeletal: Positive for back pain and joint pain.  Skin: Negative.   Neurological: Positive for tingling.  Endo/Heme/Allergies: Negative.   Psychiatric/Behavioral: Negative.    Objective  Filed Vitals:   05/18/15 1355  BP: 124/72  Pulse: 91  Temp: 98.5 F (36.9 C)  TempSrc: Oral  Resp: 18  Weight: 216 lb 6.4 oz (98.158 kg)  SpO2: 97%  Body mass index is 38.34 kg/(m^2). LMP 04-21-15  Physical Exam  Constitutional: She is oriented to person, place, and time and well-developed, well-nourished, and in no distress.  HENT:  Head: Normocephalic.  Eyes: Conjunctivae and EOM are normal.  Neck: Normal range of motion. Neck supple.  Cardiovascular: Normal rate and regular rhythm.   Pulmonary/Chest: Breath sounds normal.  Abdominal: Soft. Bowel sounds are normal.  Musculoskeletal: She exhibits tenderness.  Left wrist pain with active ulnar flexion. Decrease in grip strength.. Negative Tinel with questionable positive Phalen signs. Pulses symmetric. Some soreness to palpate lower lumbar/sacral musculature. Good LE strength.  Neurological: She is alert and oriented to person, place, and time.  Symmetric and 2+ bicep and ankle DTR. Unable to elicit knee jerk.  Psychiatric: Affect and judgment normal.   Assessment &  Plan  1. Left wrist pain Gradual onset with increase in pain ulnar side of left wrist and weak grip strength. 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 May apply moist heat or ice packs and use splints prn. Add Diclofenac and recheck in 2 weeks. May need referral to orthopedist for tendinitis versus early CTS.  - diclofenac (VOLTAREN) 75 MG EC tablet; Take 1 tablet (75 mg total) by mouth 2 (two) times daily.  Dispense: 60 tablet; Refill: 1  2. Midline low back pain without sciatica Intermittent pain since spinal anesthesia during C-section a year ago. Recommend moist heat applications, NSAID and stretching exercises. Recheck in 2 weeks.

## 2015-05-18 NOTE — Patient Instructions (Signed)
Wrist Pain There are many things that can cause wrist pain. Some common causes include:  An injury to the wrist area, such as a sprain, strain, or fracture.  Overuse of the joint.  A condition that causes increased pressure on a nerve in the wrist (carpal tunnel syndrome).  Wear and tear of the joints that occurs with aging (osteoarthritis).  A variety of other types of arthritis. Sometimes, the cause of wrist pain is not known. The pain often goes away when you follow your health care provider's instructions for relieving pain at home. If your wrist pain continues, tests may need to be done to diagnose your condition. HOME CARE INSTRUCTIONS Pay attention to any changes in your symptoms. Take these actions to help with your pain:  Rest the wrist area for at least 48 hours or as told by your health care provider.  If directed, apply ice to the injured area:  Put ice in a plastic bag.  Place a towel between your skin and the bag.  Leave the ice on for 20 minutes, 2-3 times per day.  Keep your arm raised (elevated) above the level of your heart while you are sitting or lying down.  If a splint or elastic bandage has been applied, use it as told by your health care provider.  Remove the splint or bandage only as told by your health care provider.  Loosen the splint or bandage if your fingers become numb or have a tingling feeling, or if they turn cold or blue.  Take over-the-counter and prescription medicines only as told by your health care provider.  Keep all follow-up visits as told by your health care provider. This is important. SEEK MEDICAL CARE IF:  Your pain is not helped by treatment.  Your pain gets worse. SEEK IMMEDIATE MEDICAL CARE IF:  Your fingers become swollen.  Your fingers turn white, very red, or cold and blue.  Your fingers are numb or have a tingling feeling.  You have difficulty moving your fingers.   This information is not intended to replace  advice given to you by your health care provider. Make sure you discuss any questions you have with your health care provider.   Document Released: 04/13/2005 Document Revised: 03/25/2015 Document Reviewed: 11/19/2014 Elsevier Interactive Patient Education 2016 Elsevier Inc[employer] Back Pain, Adult Back pain is very common in adults.The cause of back pain is rarely dangerous and the pain often gets better over time.The cause of your back pain may not be known. Some common causes of back pain include:  Strain of the muscles or ligaments supporting the spine.  Wear and tear (degeneration) of the spinal disks.  Arthritis.  Direct injury to the back. For many people, back pain may return. Since back pain is rarely dangerous, most people can learn to manage this condition on their own. HOME CARE INSTRUCTIONS Watch your back pain for any changes. The following actions may help to lessen any discomfort you are feeling:  Remain active. It is stressful on your back to sit or stand in one place for long periods of time. Do not sit, drive, or stand in one place for more than 30 minutes at a time. Take short walks on even surfaces as soon as you are able.Try to increase the length of time you walk each day.  Exercise regularly as directed by your health care provider. Exercise helps your back heal faster. It also helps avoid future injury by keeping your muscles strong and flexible.  Do not stay in bed.Resting more than 1-2 days can delay your recovery.  Pay attention to your body when you bend and lift. The most comfortable positions are those that put less stress on your recovering back. Always use proper lifting techniques, including:  Bending your knees.  Keeping the load close to your body.  Avoiding twisting.  Find a comfortable position to sleep. Use a firm mattress and lie on your side with your knees slightly bent. If you lie on your back, put a pillow under your knees.  Avoid feeling  anxious or stressed.Stress increases muscle tension and can worsen back pain.It is important to recognize when you are anxious or stressed and learn ways to manage it, such as with exercise.  Take medicines only as directed by your health care provider. Over-the-counter medicines to reduce pain and inflammation are often the most helpful.Your health care provider may prescribe muscle relaxant drugs.These medicines help dull your pain so you can more quickly return to your normal activities and healthy exercise.  Apply ice to the injured area:  Put ice in a plastic bag.  Place a towel between your skin and the bag.  Leave the ice on for 20 minutes, 2-3 times a day for the first 2-3 days. After that, ice and heat may be alternated to reduce pain and spasms.  Maintain a healthy weight. Excess weight puts extra stress on your back and makes it difficult to maintain good posture. SEEK MEDICAL CARE IF:  You have pain that is not relieved with rest or medicine.  You have increasing pain going down into the legs or buttocks.  You have pain that does not improve in one week.  You have night pain.  You lose weight.  You have a fever or chills. SEEK IMMEDIATE MEDICAL CARE IF:   You develop new bowel or bladder control problems.  You have unusual weakness or numbness in your arms or legs.  You develop nausea or vomiting.  You develop abdominal pain.  You feel faint.   This information is not intended to replace advice given to you by your health care provider. Make sure you discuss any questions you have with your health care provider.   Document Released: 07/04/2005 Document Revised: 07/25/2014 Document Reviewed: 11/05/2013 Elsevier Interactive Patient Education [employer].

## 2015-06-02 ENCOUNTER — Ambulatory Visit: Payer: 59 | Admitting: Family Medicine

## 2015-06-05 ENCOUNTER — Ambulatory Visit: Payer: 59 | Admitting: Family Medicine

## 2015-07-08 ENCOUNTER — Emergency Department
Admission: EM | Admit: 2015-07-08 | Discharge: 2015-07-08 | Disposition: A | Discharge status: Home / Self Care | Payer: 59 | Attending: Emergency Medicine | Admitting: Emergency Medicine

## 2015-07-08 ENCOUNTER — Emergency Department: Payer: 59

## 2015-07-08 ENCOUNTER — Encounter: Payer: Self-pay | Admitting: *Deleted

## 2015-07-08 DIAGNOSIS — S8992XA Unspecified injury of left lower leg, initial encounter: Secondary | ICD-10-CM | POA: Diagnosis present

## 2015-07-08 DIAGNOSIS — Y9389 Activity, other specified: Secondary | ICD-10-CM | POA: Insufficient documentation

## 2015-07-08 DIAGNOSIS — Y998 Other external cause status: Secondary | ICD-10-CM | POA: Insufficient documentation

## 2015-07-08 DIAGNOSIS — S8002XA Contusion of left knee, initial encounter: Secondary | ICD-10-CM | POA: Diagnosis not present

## 2015-07-08 DIAGNOSIS — Z791 Long term (current) use of non-steroidal anti-inflammatories (NSAID): Secondary | ICD-10-CM | POA: Insufficient documentation

## 2015-07-08 DIAGNOSIS — Z87891 Personal history of nicotine dependence: Secondary | ICD-10-CM | POA: Diagnosis not present

## 2015-07-08 DIAGNOSIS — S8012XA Contusion of left lower leg, initial encounter: Secondary | ICD-10-CM | POA: Insufficient documentation

## 2015-07-08 DIAGNOSIS — Y92410 Unspecified street and highway as the place of occurrence of the external cause: Secondary | ICD-10-CM | POA: Diagnosis not present

## 2015-07-08 MED ORDER — IBUPROFEN 800 MG PO TABS: 800.0000 mg | ORAL_TABLET | Freq: Three times a day (TID) | ORAL | Status: DC
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
## 2015-07-08 NOTE — Discharge Instructions (Signed)
Contusion A contusion is a deep bruise. Contusions happen when an injury causes bleeding under the skin. Symptoms of bruising include pain, swelling, and discolored skin. The skin may turn blue, purple, or yellow. HOME CARE   Rest the injured area.  If told, put ice on the injured area.  Put ice in a plastic bag.  Place a towel between your skin and the bag.  Leave the ice on for 20 minutes, 2-3 times per day.  If told, put light pressure (compression) on the injured area using an elastic bandage. Make sure the bandage is not too tight. Remove it and put it back on as told by your doctor.  If possible, raise (elevate) the injured area above the level of your heart while you are sitting or lying down.  Take over-the-counter and prescription medicines only as told by your doctor. GET HELP IF:  Your symptoms do not get better after several days of treatment.  Your symptoms get worse.  You have trouble moving the injured area. GET HELP RIGHT AWAY IF:   You have very bad pain.  You have a loss of feeling (numbness) in a hand or foot.  Your hand or foot turns pale or cold.   This information is not intended to replace advice given to you by your health care provider. Make sure you discuss any questions you have with your health care provider.   Document Released: 12/21/2007 Document Revised: 03/25/2015 Document Reviewed: 11/19/2014 Elsevier Interactive Patient Education 2016 [place] and elevate as needed for swelling or discomfort. Ibuprofen as needed for pain. Follow-up with [facility] clinic if any continued problems.
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
## 2015-07-10 ENCOUNTER — Ambulatory Visit: Payer: 59 | Admitting: Family Medicine

## 2015-07-24 ENCOUNTER — Ambulatory Visit: Payer: Self-pay | Admitting: Family Medicine

## 2015-08-19 DIAGNOSIS — M79673 Pain in unspecified foot: Secondary | ICD-10-CM

## 2015-09-16 ENCOUNTER — Emergency Department
Admission: EM | Admit: 2015-09-16 | Discharge: 2015-09-16 | Disposition: A | Discharge status: Home / Self Care | Payer: 59 | Attending: Emergency Medicine | Admitting: Emergency Medicine

## 2015-09-16 ENCOUNTER — Encounter: Payer: Self-pay | Admitting: Emergency Medicine

## 2015-09-16 ENCOUNTER — Emergency Department: Payer: 59

## 2015-09-16 DIAGNOSIS — Z3202 Encounter for pregnancy test, result negative: Secondary | ICD-10-CM | POA: Diagnosis not present

## 2015-09-16 DIAGNOSIS — Z87891 Personal history of nicotine dependence: Secondary | ICD-10-CM | POA: Insufficient documentation

## 2015-09-16 DIAGNOSIS — N939 Abnormal uterine and vaginal bleeding, unspecified: Secondary | ICD-10-CM | POA: Insufficient documentation

## 2015-09-16 LAB — URINALYSIS COMPLETE WITH MICROSCOPIC (ARMC ONLY)
Bacteria, UA: NONE SEEN
SPECIFIC GRAVITY, URINE: 1.026 (ref 1.005–1.030)

## 2015-09-16 LAB — BASIC METABOLIC PANEL
Anion gap: 5 (ref 5–15)
BUN: 14 mg/dL (ref 6–20)
CHLORIDE: 108 mmol/L (ref 101–111)
CO2: 25 mmol/L (ref 22–32)
Calcium: 8.8 mg/dL — ABNORMAL LOW (ref 8.9–10.3)
Creatinine, Ser: 0.7 mg/dL (ref 0.44–1.00)
GFR calc Af Amer: >60 mL/min (ref >60)
GFR calc non Af Amer: >60 mL/min (ref >60)
GLUCOSE: 93 mg/dL (ref 65–99)
POTASSIUM: 4 mmol/L (ref 3.5–5.1)
Sodium: 138 mmol/L (ref 135–145)

## 2015-09-16 LAB — CBC WITH DIFFERENTIAL/PLATELET
Basophils Absolute: 0 10*3/uL (ref 0–0.1)
Basophils Relative: 1 %
Eosinophils Absolute: 0.3 10*3/uL (ref 0–0.7)
Eosinophils Relative: 3 %
HCT: 38 % (ref 35.0–47.0)
HEMOGLOBIN: 12.9 g/dL (ref 12.0–16.0)
LYMPHS ABS: 3.2 10*3/uL (ref 1.0–3.6)
Lymphocytes Relative: 33 %
MCH: 28.9 pg (ref 26.0–34.0)
MCHC: 33.8 g/dL (ref 32.0–36.0)
MCV: 85.4 fL (ref 80.0–100.0)
Monocytes Absolute: 0.6 10*3/uL (ref 0.2–0.9)
Monocytes Relative: 6 %
NEUTROS PCT: 57 %
Neutro Abs: 5.6 10*3/uL (ref 1.4–6.5)
Platelets: 311 10*3/uL (ref 150–440)
RBC: 4.45 MIL/uL (ref 3.80–5.20)
RDW: 12.9 % (ref 11.5–14.5)
WBC: 9.7 10*3/uL (ref 3.6–11.0)

## 2015-09-16 LAB — HCG, QUANTITATIVE, PREGNANCY: hCG, Beta Chain, Quant, S: <1 m[IU]/mL (ref <5)

## 2015-09-16 NOTE — Discharge Instructions (Signed)

## 2015-09-16 NOTE — ED Provider Notes (Signed)
[facility] Emergency Department Provider Note  ____________________________________________   I have reviewed the triage vital signs and the nursing notes.   HISTORY  Chief Complaint Vaginal Bleeding    HPI Susan Hubbard is a 25 y.o. female with a history of ovarian cysts, presents today with vaginal bleeding. She is not on any birth control and has not been for months to years. Denies any pregnancy. It was a normal period at the onset however is lasting longer than normal. She has gone through several pads yesterday. Denies lightheadedness. Not on any anticoagulation medications. Denies any fever or chills. Denies any significant pain or cramping.  Past Medical History  Diagnosis Date  . Hyperlipidemia     Patient Active Problem List   Diagnosis Date Noted  . Abortion 05/18/2015  . Vitamin D deficiency 02/05/2015  . Obesity 02/05/2015  . Pure hypercholesterolemia 02/05/2015  . Kidney stones 02/05/2015  . Sleep disturbance 02/05/2015  . Acne 02/05/2015  . Irregular menses 02/05/2015  . Tobacco abuse 02/05/2015  . Eczema 02/05/2015  . History of PCOS 02/05/2015    Past Surgical History  Procedure Laterality Date  . Cesarean section      Current Outpatient Rx  Name  Route  Sig  Dispense  Refill  . diclofenac (VOLTAREN) 75 MG EC tablet   Oral   Take 1 tablet (75 mg total) by mouth 2 (two) times daily. Patient not taking: Reported on 09/16/2015   60 tablet   1   . ibuprofen (ADVIL,MOTRIN) 800 MG tablet   Oral   Take 1 tablet (800 mg total) by mouth 3 (three) times daily. Patient not taking: Reported on 09/16/2015   30 tablet   0     Allergies Augmentin  Family History  Problem Relation Age of Onset  . Diabetes Mother     type II  . Hyperlipidemia Mother   . Hypertension Mother   . GER disease Mother   . Sleep apnea Mother   . Hypertension Father   . Hyperlipidemia Maternal Grandmother   . Diabetes Maternal Grandmother    Type II  . Cancer Maternal Grandfather     Lung cancer  . Hypertension Paternal Grandmother   . Cancer Paternal Grandfather   . Diabetes Paternal Grandfather   . Hyperlipidemia Paternal Grandfather   . Cataracts Paternal Grandfather     Social History Social History  Substance Use Topics  . Smoking status: Former Research scientist (life sciences)  . Smokeless tobacco: Never Used     Comment: Recently quit tobacco use; patient states that she quit smoking 11/2011  . Alcohol Use: No    Review of Systems Constitutional: No fever/chills Eyes: No visual changes. ENT: No sore throat. No stiff neck no neck pain Cardiovascular: Denies chest pain. Respiratory: Denies shortness of breath. Gastrointestinal:   no vomiting.  No diarrhea.  No constipation. Genitourinary: Negative for dysuria. Musculoskeletal: Negative lower extremity swelling Skin: Negative for rash. Neurological: Negative for headaches, focal weakness or numbness. 10-point ROS otherwise negative.  ____________________________________________   PHYSICAL EXAM:  VITAL SIGNS: ED Triage Vitals  Enc Vitals Group     BP 09/16/15 0702 117/70 mmHg     Pulse Rate 09/16/15 0702 74     Resp 09/16/15 0702 14     Temp 09/16/15 0702 98.6 F (37 C)     Temp Source 09/16/15 0702 Oral     SpO2 09/16/15 0702 98 %     Weight 09/16/15 0702 216 lb (97.977 kg)  Height 09/16/15 0702 5\' 3"  (1.6 m)     Head Cir --      Peak Flow --      Pain Score --      Pain Loc --      Pain Edu? --      Excl. in [place]? --     Constitutional: Alert and oriented. Well appearing and in no acute distress. Eyes: Conjunctivae are normal. PERRL. EOMI. Head: Atraumatic. Nose: No congestion/rhinnorhea. Mouth/Throat: Mucous membranes are moist.  Oropharynx non-erythematous. Neck: No stridor.   Nontender with no meningismus Cardiovascular: Normal rate, regular rhythm. Grossly normal heart sounds.  Good peripheral circulation. Respiratory: Normal respiratory effort.  No  retractions. Lungs CTAB. Abdominal: Soft and nontender. No distention. No guarding no rebound Back:  There is no focal tenderness or step off there is no midline tenderness there are no lesions noted. there is no CVA tenderness Pelvic exam: Female nurse chaperone present, no external lesions noted, physiologic vaginal discharge noted with no purulent discharge, no cervical motion tenderness, no adnexal tenderness or mass, there is no significant uterine tenderness or mass. Mild dark vaginal bleeding Musculoskeletal: No lower extremity tenderness. No joint effusions, no DVT signs strong distal pulses no edema Neurologic:  Normal speech and language. No gross focal neurologic deficits are appreciated.  Skin:  Skin is warm, dry and intact. No rash noted. Psychiatric: Mood and affect are normal. Speech and behavior are normal.  ____________________________________________   LABS (all labs ordered are listed, but only abnormal results are displayed)  Labs Reviewed  BASIC METABOLIC PANEL - Abnormal; Notable for the following:    Calcium 8.8 (*)    All other components within normal limits  URINALYSIS COMPLETEWITH MICROSCOPIC ([facility] ONLY) - Abnormal; Notable for the following:    Color, Urine RED (*)    APPearance TURBID (*)    Glucose, UA   (*)    Value: TEST NOT REPORTED DUE TO COLOR INTERFERENCE OF URINE PIGMENT   Bilirubin Urine   (*)    Value: TEST NOT REPORTED DUE TO COLOR INTERFERENCE OF URINE PIGMENT   Ketones, ur   (*)    Value: TEST NOT REPORTED DUE TO COLOR INTERFERENCE OF URINE PIGMENT   Hgb urine dipstick   (*)    Value: TEST NOT REPORTED DUE TO COLOR INTERFERENCE OF URINE PIGMENT   Protein, ur   (*)    Value: TEST NOT REPORTED DUE TO COLOR INTERFERENCE OF URINE PIGMENT   Nitrite   (*)    Value: TEST NOT REPORTED DUE TO COLOR INTERFERENCE OF URINE PIGMENT   Leukocytes, UA   (*)    Value: TEST NOT REPORTED DUE TO COLOR INTERFERENCE OF URINE PIGMENT   Squamous Epithelial / LPF  6-30 (*)    All other components within normal limits  CBC WITH DIFFERENTIAL/PLATELET  HCG, QUANTITATIVE, PREGNANCY   ____________________________________________  EKG  I personally interpreted any EKGs ordered by me or triage  ____________________________________________  RADIOLOGY  I reviewed any imaging ordered by me or triage that were performed during my shift ____________________________________________   PROCEDURES  Procedure(s) performed: None  Critical Care performed: None  ____________________________________________   INITIAL IMPRESSION / ASSESSMENT AND PLAN / ED COURSE  Pertinent labs & imaging results that were available during my care of the patient were reviewed by me and considered in my medical decision making (see chart for details).  Ultrasound reassuring pelvic exam reassuring vital signs reassuring blood work reassuring hemoglobin reassuring patient does have a  outpatient follow-up with OB/GYN. We will discharge her. Return precautions and follow-up given and understood. Patient is very comfortable with the discharge plan. Customary extensive return precautions and follow-up explained to and understood by the patient.  Questions answered. ____________________________________________   FINAL CLINICAL IMPRESSION(S) / ED DIAGNOSES  Final diagnoses:  Vaginal bleeding      This chart was dictated using voice recognition software.  Despite best efforts to proofread,  errors can occur which can change meaning.     Schuyler Amor, MD 09/16/15 1141

## 2015-09-16 NOTE — ED Notes (Signed)
Normal period started last Monday and it has not stopped  And is heavy.

## 2015-09-21 ENCOUNTER — Inpatient Hospital Stay (HOSPITAL_COMMUNITY)
Admission: AD | Admit: 2015-09-21 | Discharge: 2015-09-21 | Disposition: A | Discharge status: Home / Self Care | Payer: 59 | Source: Ambulatory Visit | Attending: Family Medicine | Admitting: Family Medicine

## 2015-09-21 ENCOUNTER — Encounter (HOSPITAL_COMMUNITY): Payer: Self-pay

## 2015-09-21 DIAGNOSIS — D62 Acute posthemorrhagic anemia: Secondary | ICD-10-CM | POA: Insufficient documentation

## 2015-09-21 DIAGNOSIS — N926 Irregular menstruation, unspecified: Secondary | ICD-10-CM

## 2015-09-21 DIAGNOSIS — E785 Hyperlipidemia, unspecified: Secondary | ICD-10-CM | POA: Insufficient documentation

## 2015-09-21 DIAGNOSIS — Z87891 Personal history of nicotine dependence: Secondary | ICD-10-CM | POA: Insufficient documentation

## 2015-09-21 DIAGNOSIS — N939 Abnormal uterine and vaginal bleeding, unspecified: Secondary | ICD-10-CM | POA: Diagnosis present

## 2015-09-21 DIAGNOSIS — Z880 Allergy status to penicillin: Secondary | ICD-10-CM | POA: Insufficient documentation

## 2015-09-21 HISTORY — DX: Polycystic ovarian syndrome: E28.2

## 2015-09-21 LAB — URINE MICROSCOPIC-ADD ON
BACTERIA UA: NONE SEEN
WBC UA: NONE SEEN WBC/hpf (ref 0–5)

## 2015-09-21 LAB — POCT PREGNANCY, URINE: PREG TEST UR: NEGATIVE

## 2015-09-21 LAB — URINALYSIS, ROUTINE W REFLEX MICROSCOPIC
Bilirubin Urine: NEGATIVE
Glucose, UA: NEGATIVE mg/dL
Ketones, ur: 15 mg/dL — AB
Leukocytes, UA: NEGATIVE
NITRITE: NEGATIVE
PH: 6 (ref 5.0–8.0)
Protein, ur: NEGATIVE mg/dL

## 2015-09-21 LAB — CBC
HEMATOCRIT: 32.1 % — AB (ref 36.0–46.0)
HEMOGLOBIN: 10.7 g/dL — AB (ref 12.0–15.0)
MCH: 28.9 pg (ref 26.0–34.0)
MCHC: 33.3 g/dL (ref 30.0–36.0)
MCV: 86.8 fL (ref 78.0–100.0)
Platelets: 334 10*3/uL (ref 150–400)
RBC: 3.7 MIL/uL — AB (ref 3.87–5.11)
RDW: 13.1 % (ref 11.5–15.5)
WBC: 10.8 10*3/uL — AB (ref 4.0–10.5)

## 2015-09-21 MED ORDER — FERROUS SULFATE 325 (65 FE) MG PO TABS: 325.0000 mg | ORAL_TABLET | Freq: Two times a day (BID) | ORAL | Status: DC

## 2015-09-21 MED ORDER — SODIUM CHLORIDE 0.9 % IV SOLN
510.0000 mg | Freq: Once | INTRAVENOUS | Status: AC
  Administered 2015-09-21: 510 mg via INTRAVENOUS
  Filled 2015-09-21: qty 17

## 2015-09-21 MED ORDER — SODIUM CHLORIDE 0.9 % IV SOLN
INTRAVENOUS | Status: DC
  Administered 2015-09-21: 18:00:00 via INTRAVENOUS

## 2015-09-21 MED ORDER — FERROUS SULFATE 325 (65 FE) MG PO TABS: 325.0000 mg | ORAL_TABLET | Freq: Every day | ORAL | Status: DC

## 2015-09-21 MED ORDER — NORGESTIMATE-ETH ESTRADIOL 0.25-35 MG-MCG PO TABS: 1.0000 | ORAL_TABLET | Freq: Every day | ORAL | Status: DC

## 2015-09-21 NOTE — MAU Note (Signed)
Pt presents to MAU with complaints of vaginal bleeding since February the 20th. Mild cramping

## 2015-09-21 NOTE — MAU Provider Note (Signed)
History     CSN: [id number]  Arrival date and time: 09/21/15 1538   First Provider Initiated Contact with Patient 09/21/15 1656      Chief Complaint  Patient presents with  . Vaginal Bleeding   HPI   Ms.Susan Hubbard is a 25 y.o. female G1P0  With a history of PCOS presenting today with 2 weeks of vaginal bleeding. Pt reports she began her menstrual period on 09/07/15 which was expected. She was seen at the [facility] ED last week but pt said she it has not gotten better since. [facility] recommended she follow up with GYN. Her normal period lasts 5 days. Per pt, period has continued for full 2 weeks and is extremely heavy going through 2 packs of 24 pads and using tampons each day and passing large clots. Pt is sexually active and doesn't think she could have been pregnant but unsure. Pt states she is passing large clots frequently. Pt is not using hormonal contraception. Pt states she feels weak and has vomited twice today with some SOB. Denies pain with bleeding or abdominal cramps. Denies fever/chills or chest pain.   OB History    Gravida Para Term Preterm AB TAB SAB Ectopic Multiple Living   1         1      Past Medical History  Diagnosis Date  . Hyperlipidemia   . PCOS (polycystic ovarian syndrome)     Past Surgical History  Procedure Laterality Date  . Cesarean section      Family History  Problem Relation Age of Onset  . Diabetes Mother     type II  . Hyperlipidemia Mother   . Hypertension Mother   . GER disease Mother   . Sleep apnea Mother   . Hypertension Father   . Hyperlipidemia Maternal Grandmother   . Diabetes Maternal Grandmother     Type II  . Cancer Maternal Grandfather     Lung cancer  . Hypertension Paternal Grandmother   . Cancer Paternal Grandfather   . Diabetes Paternal Grandfather   . Hyperlipidemia Paternal Grandfather   . Cataracts Paternal Grandfather     Social History  Substance Use Topics  . Smoking status: Former Research scientist (life sciences)  . Smokeless  tobacco: Never Used     Comment: Recently quit tobacco use; patient states that she quit smoking 11/2011  . Alcohol Use: No    Allergies:  Allergies  Allergen Reactions  . Augmentin [Amoxicillin-Pot Clavulanate] Nausea And Vomiting    Has patient had a PCN reaction causing immediate rash, facial/tongue/throat swelling, SOB or lightheadedness with hypotension: no Has patient had a PCN reaction causing severe rash involving mucus membranes or skin necrosis: unknown Has patient had a PCN reaction that required hospitalization no Has patient had a PCN reaction occurring within the last 10 years: yes If all of the above answers are "NO", then may proceed with Cephalosporin use.     Prescriptions prior to admission  Medication Sig Dispense Refill Last Dose  . Chlorphen-Pseudoephed-APAP (THERAFLU FLU/COLD PO) Take 2 capsules by mouth once.   09/21/2015 at Unknown time  . diclofenac (VOLTAREN) 75 MG EC tablet Take 1 tablet (75 mg total) by mouth 2 (two) times daily. (Patient not taking: Reported on 09/16/2015) 60 tablet 1   . ibuprofen (ADVIL,MOTRIN) 800 MG tablet Take 1 tablet (800 mg total) by mouth 3 (three) times daily. (Patient not taking: Reported on 09/16/2015) 30 tablet 0    Results for orders placed or performed during  the hospital encounter of 09/21/15 (from the past 48 hour(s))  Urinalysis, Routine w reflex microscopic (not at [facility])     Status: Abnormal   Collection Time: 09/21/15  4:00 PM  Result Value Ref Range   Color, Urine YELLOW YELLOW   APPearance CLEAR CLEAR   Specific Gravity, Urine >1.030 (H) 1.005 - 1.030   pH 6.0 5.0 - 8.0   Glucose, UA NEGATIVE NEGATIVE mg/dL   Hgb urine dipstick LARGE (A) NEGATIVE   Bilirubin Urine NEGATIVE NEGATIVE   Ketones, ur 15 (A) NEGATIVE mg/dL   Protein, ur NEGATIVE NEGATIVE mg/dL   Nitrite NEGATIVE NEGATIVE   Leukocytes, UA NEGATIVE NEGATIVE  Urine microscopic-add on     Status: Abnormal   Collection Time: 09/21/15  4:00 PM  Result Value  Ref Range   Squamous Epithelial / LPF 0-5 (A) NONE SEEN   WBC, UA NONE SEEN 0 - 5 WBC/hpf   RBC / HPF 6-30 0 - 5 RBC/hpf   Bacteria, UA NONE SEEN NONE SEEN   Urine-Other MUCOUS PRESENT   Pregnancy, urine POC     Status: None   Collection Time: 09/21/15  4:19 PM  Result Value Ref Range   Preg Test, Ur NEGATIVE NEGATIVE    Comment:        THE SENSITIVITY OF THIS METHODOLOGY IS >24 mIU/mL   CBC     Status: Abnormal   Collection Time: 09/21/15  4:43 PM  Result Value Ref Range   WBC 10.8 (H) 4.0 - 10.5 K/uL   RBC 3.70 (L) 3.87 - 5.11 MIL/uL   Hemoglobin 10.7 (L) 12.0 - 15.0 g/dL   HCT 32.1 (L) 36.0 - 46.0 %   MCV 86.8 78.0 - 100.0 fL   MCH 28.9 26.0 - 34.0 pg   MCHC 33.3 30.0 - 36.0 g/dL   RDW 13.1 11.5 - 15.5 %   Platelets 334 150 - 400 K/uL    Review of Systems  Constitutional: Negative for fever and chills.  Respiratory: Positive for shortness of breath. Negative for cough.   Cardiovascular: Negative for chest pain and palpitations.  Gastrointestinal: Positive for nausea and vomiting. Negative for abdominal pain, diarrhea and constipation.  Genitourinary: Negative for dysuria, urgency and frequency.  Neurological: Positive for dizziness and weakness. Negative for loss of consciousness.   Physical Exam   Blood pressure 139/89, pulse 86, temperature 98.5 F (36.9 C), temperature source Oral, resp. rate 18, height 5\' 3"  (1.6 m), weight 216 lb (97.977 kg), last menstrual period 09/16/2015, unknown if currently breastfeeding.  Physical Exam  Constitutional: She is oriented to person, place, and time. She appears well-developed and well-nourished. No distress.  HENT:  Head: Normocephalic.  Neck: Neck supple.  Cardiovascular: Normal rate.   Respiratory: Effort normal and breath sounds normal. No respiratory distress. She has no wheezes.  Genitourinary:  Speculum exam: Vagina - Moderate amount of dark red blood pooling in the vagina. Small; 0.5-1 cm clots noted.  Cervix - +  active bleeding  Bimanual exam: Cervix closed, no CMT  Uterus non tender, normal size Adnexa non tender, no masses bilaterally Chaperone present for exam.  Musculoskeletal: Normal range of motion.  Neurological: She is alert and oriented to person, place, and time.  Skin: Skin is warm. She is not diaphoretic. There is pallor.  Psychiatric: Her behavior is normal.    MAU Course  Procedures  None  MDM  CBC on 3/1: 12.9 Feraheme 510 IV NS 1 liter bolus    Assessment and Plan  A:  1. Acute blood loss anemia   2. Abnormal menstrual cycle     P:  Discharge home in stable condition RX: Sprintec; double pills X 5 days        Iron BID Follow up with GYN at [facility] Return to MAU if symptoms worsen  Increase PO fluid intake Bleeding precautions     Lezlie Lye, NP 09/21/2015 7:50 PM

## 2015-09-21 NOTE — Discharge Instructions (Signed)
Anemia, Nonspecific Anemia is a condition in which the concentration of red blood cells or hemoglobin in the blood is below normal. Hemoglobin is a substance in red blood cells that carries oxygen to the tissues of the body. Anemia results in not enough oxygen reaching these tissues.  CAUSES  Common causes of anemia include:   Excessive bleeding. Bleeding may be internal or external. This includes excessive bleeding from periods (in women) or from the intestine.   Poor nutrition.   Chronic kidney, thyroid, and liver disease.  Bone marrow disorders that decrease red blood cell production.  Cancer and treatments for cancer.  HIV, AIDS, and their treatments.  Spleen problems that increase red blood cell destruction.  Blood disorders.  Excess destruction of red blood cells due to infection, medicines, and autoimmune disorders. SIGNS AND SYMPTOMS   Minor weakness.   Dizziness.   Headache.  Palpitations.   Shortness of breath, especially with exercise.   Paleness.  Cold sensitivity.  Indigestion.  Nausea.  Difficulty sleeping.  Difficulty concentrating. Symptoms may occur suddenly or they may develop slowly.  DIAGNOSIS  Additional blood tests are often needed. These help your health care provider determine the best treatment. Your health care provider will check your stool for blood and look for other causes of blood loss.  TREATMENT  Treatment varies depending on the cause of the anemia. Treatment can include:   Supplements of iron, vitamin 123456, or folic acid.   Hormone medicines.   A blood transfusion. This may be needed if blood loss is severe.   Hospitalization. This may be needed if there is significant continual blood loss.   Dietary changes.  Spleen removal. HOME CARE INSTRUCTIONS Keep all follow-up appointments. It often takes many weeks to correct anemia, and having your health care provider check on your condition and your response to  treatment is very important. SEEK IMMEDIATE MEDICAL CARE IF:   You develop extreme weakness, shortness of breath, or chest pain.   You become dizzy or have trouble concentrating.  You develop heavy vaginal bleeding.   You develop a rash.   You have bloody or black, tarry stools.   You faint.   You vomit up blood.   You vomit repeatedly.   You have abdominal pain.  You have a fever or persistent symptoms for more than 2-3 days.   You have a fever and your symptoms suddenly get worse.   You are dehydrated.  MAKE SURE YOU:  Understand these instructions.  Will watch your condition.  Will get help right away if you are not doing well or get worse.   This information is not intended to replace advice given to you by your health care provider. Make sure you discuss any questions you have with your health care provider.   Document Released: 08/11/2004 Document Revised: 03/06/2013 Document Reviewed: 12/28/2012 Elsevier Interactive Patient Education 2016 Else[employer]  Abnormal Uterine Bleeding Abnormal uterine bleeding means bleeding from the vagina that is not your normal menstrual period. This can be:  Bleeding or spotting between periods.  Bleeding after sex (sexual intercourse).  Bleeding that is heavier or more than normal.  Periods that last longer than usual.  Bleeding after menopause. There are many problems that may cause this. Treatment will depend on the cause of the bleeding. Any kind of bleeding that is not normal should be reviewed by your doctor.  HOME CARE Watch your condition for any changes. These actions may lessen any discomfort you are having:  Do not use tampons or douches as told by your doctor.  Change your pads often. You should get regular pelvic exams and Pap tests. Keep all appointments for tests as told by your doctor. GET HELP IF:  You are bleeding for more than 1 week.  You feel dizzy at times. GET HELP RIGHT AWAY IF:     You pass out.  You have to change pads every 15 to 30 minutes.  You have belly pain.  You have a fever.  You become sweaty or weak.  You are passing large blood clots from the vagina.  You feel sick to your stomach (nauseous) and throw up (vomit). MAKE SURE YOU:  Understand these instructions.  Will watch your condition.  Will get help right away if you are not doing well or get worse.   This information is not intended to replace advice given to you by your health care provider. Make sure you discuss any questions you have with your health care provider.   Document Released: 05/01/2009 Document Revised: 07/09/2013 Document Reviewed: 01/31/2013 Elsevier Interactive Patient Education [employer].

## 2015-09-28 DIAGNOSIS — M79673 Pain in unspecified foot: Secondary | ICD-10-CM

## 2015-10-19 ENCOUNTER — Other Ambulatory Visit: Payer: 59

## 2015-10-19 ENCOUNTER — Encounter: Payer: Self-pay | Admitting: *Deleted

## 2015-10-19 NOTE — Patient Instructions (Signed)
  Your procedure is scheduled on: 10-20-15 Report to [facility] To find out your arrival time please call [phone] between 1PM - 3PM on 10-19-15  Remember: Instructions that are not followed completely may result in serious medical risk, up to and including death, or upon the discretion of your surgeon and anesthesiologist your surgery may need to be rescheduled.    _X__ 1. Do not eat food or drink liquids after midnight. No gum chewing or hard candies.     _X__ 2. No Alcohol for 24 hours before or after surgery.   ____ 3. Bring all medications with you on the day of surgery if instructed.    ____ 4. Notify your doctor if there is any change in your medical condition     (cold, fever, infections).     Do not wear jewelry, make-up, hairpins, clips or nail polish.  Do not wear lotions, powders, or perfumes. You may wear deodorant.  Do not shave 48 hours prior to surgery. Men may shave face and neck.  Do not bring valuables to the hospital.    [facility] is not responsible for any belongings or valuables.               Contacts, dentures or bridgework may not be worn into surgery.  Leave your suitcase in the car. After surgery it may be brought to your room.  For patients admitted to the hospital, discharge time is determined by your treatment team.   Patients discharged the day of surgery will not be allowed to drive home.   Please read over the following fact sheets that you were given:      ____ Take these medicines the morning of surgery with A SIP OF WATER:    1. NONE  2.   3.   4.  5.  6.  ____ Fleet Enema (as directed)   ____ Use CHG Soap as directed  ____ Use inhalers on the day of surgery  ____ Stop metformin 2 days prior to surgery    ____ Take 1/2 of usual insulin dose the night before surgery and none on the morning of surgery.   ____ Stop Coumadin/Plavix/aspirin-N/A  ____ Stop Anti-inflammatories-NO NSAIDS OR ASA  PRODUCTS-TYLENOL OK TO TAKE   ____ Stop supplements until after surgery.    ____ Bring C-Pap to the hospital.

## 2015-10-20 ENCOUNTER — Ambulatory Visit
Admission: RE | Admit: 2015-10-20 | Discharge: 2015-10-20 | Disposition: A | Discharge status: Home / Self Care | Payer: 59 | Source: Ambulatory Visit | Attending: Obstetrics and Gynecology | Admitting: Obstetrics and Gynecology

## 2015-10-20 ENCOUNTER — Ambulatory Visit: Payer: 59 | Admitting: Anesthesiology

## 2015-10-20 ENCOUNTER — Encounter: Payer: Self-pay | Admitting: *Deleted

## 2015-10-20 ENCOUNTER — Encounter
Admission: RE | Disposition: A | Discharge status: Home / Self Care | Payer: Self-pay | Source: Ambulatory Visit | Attending: Obstetrics and Gynecology

## 2015-10-20 DIAGNOSIS — E282 Polycystic ovarian syndrome: Secondary | ICD-10-CM | POA: Diagnosis not present

## 2015-10-20 DIAGNOSIS — F17200 Nicotine dependence, unspecified, uncomplicated: Secondary | ICD-10-CM | POA: Insufficient documentation

## 2015-10-20 DIAGNOSIS — Z9889 Other specified postprocedural states: Secondary | ICD-10-CM

## 2015-10-20 DIAGNOSIS — N920 Excessive and frequent menstruation with regular cycle: Secondary | ICD-10-CM | POA: Diagnosis present

## 2015-10-20 HISTORY — DX: Anemia, unspecified: D64.9

## 2015-10-20 HISTORY — PX: HYSTEROSCOPY WITH D & C: SHX1775

## 2015-10-20 HISTORY — DX: Chronic kidney disease, unspecified: N18.9

## 2015-10-20 LAB — CBC
HEMATOCRIT: 34.5 % — AB (ref 35.0–47.0)
HEMOGLOBIN: 11.6 g/dL — AB (ref 12.0–16.0)
MCH: 29.3 pg (ref 26.0–34.0)
MCHC: 33.7 g/dL (ref 32.0–36.0)
MCV: 86.9 fL (ref 80.0–100.0)
Platelets: 362 10*3/uL (ref 150–440)
RBC: 3.97 MIL/uL (ref 3.80–5.20)
RDW: 13.7 % (ref 11.5–14.5)
WBC: 9.3 10*3/uL (ref 3.6–11.0)

## 2015-10-20 LAB — POCT PREGNANCY, URINE: PREG TEST UR: NEGATIVE

## 2015-10-20 LAB — TYPE AND SCREEN
ABO/RH(D): O POS
ANTIBODY SCREEN: NEGATIVE

## 2015-10-20 LAB — ABO/RH: ABO/RH(D): O POS

## 2015-10-20 SURGERY — DILATATION AND CURETTAGE /HYSTEROSCOPY
Anesthesia: General | Site: Vagina | Wound class: Clean Contaminated

## 2015-10-20 MED ORDER — SUGAMMADEX SODIUM 200 MG/2ML IV SOLN
INTRAVENOUS | Status: DC | PRN
  Administered 2015-10-20: 202.4 mg via INTRAVENOUS

## 2015-10-20 MED ORDER — HYDROCODONE-ACETAMINOPHEN 5-325 MG PO TABS: 1.0000 | ORAL_TABLET | Freq: Four times a day (QID) | ORAL | Status: DC | PRN

## 2015-10-20 MED ORDER — FAMOTIDINE 20 MG PO TABS
20.0000 mg | ORAL_TABLET | Freq: Once | ORAL | Status: AC
  Administered 2015-10-20: 20 mg via ORAL

## 2015-10-20 MED ORDER — SUCCINYLCHOLINE CHLORIDE 20 MG/ML IJ SOLN
INTRAMUSCULAR | Status: DC | PRN
  Administered 2015-10-20: 120 mg via INTRAVENOUS

## 2015-10-20 MED ORDER — MIDAZOLAM HCL 2 MG/2ML IJ SOLN
INTRAMUSCULAR | Status: DC | PRN
  Administered 2015-10-20: 2 mg via INTRAVENOUS

## 2015-10-20 MED ORDER — ONDANSETRON HCL 4 MG/2ML IJ SOLN: 4.0000 mg | Freq: Once | INTRAMUSCULAR | Status: DC | PRN

## 2015-10-20 MED ORDER — PROPOFOL 10 MG/ML IV BOLUS
INTRAVENOUS | Status: DC | PRN
  Administered 2015-10-20: 150 mg via INTRAVENOUS

## 2015-10-20 MED ORDER — FENTANYL CITRATE (PF) 100 MCG/2ML IJ SOLN
INTRAMUSCULAR | Status: DC | PRN
Start: 2015-10-20 — End: 2015-10-20
  Administered 2015-10-20: 100 ug via INTRAVENOUS

## 2015-10-20 MED ORDER — FAMOTIDINE 20 MG PO TABS
ORAL_TABLET | ORAL | Status: AC
  Administered 2015-10-20: 20 mg via ORAL
  Filled 2015-10-20: qty 1

## 2015-10-20 MED ORDER — ONDANSETRON HCL 4 MG/2ML IJ SOLN
INTRAMUSCULAR | Status: DC | PRN
Start: 2015-10-20 — End: 2015-10-20
  Administered 2015-10-20: 4 mg via INTRAVENOUS

## 2015-10-20 MED ORDER — DEXAMETHASONE SODIUM PHOSPHATE 10 MG/ML IJ SOLN
INTRAMUSCULAR | Status: DC | PRN
  Administered 2015-10-20: 10 mg via INTRAVENOUS

## 2015-10-20 MED ORDER — LACTATED RINGERS IV SOLN
INTRAVENOUS | Status: DC
Start: 2015-10-20 — End: 2015-10-21
  Administered 2015-10-20 (×2): via INTRAVENOUS

## 2015-10-20 MED ORDER — IBUPROFEN 600 MG PO TABS: 600.0000 mg | ORAL_TABLET | Freq: Four times a day (QID) | ORAL | Status: DC | PRN

## 2015-10-20 MED ORDER — ROCURONIUM BROMIDE 100 MG/10ML IV SOLN
INTRAVENOUS | Status: DC | PRN
  Administered 2015-10-20: 30 mg via INTRAVENOUS
  Administered 2015-10-20: 10 mg via INTRAVENOUS

## 2015-10-20 MED ORDER — FENTANYL CITRATE (PF) 100 MCG/2ML IJ SOLN
INTRAMUSCULAR | Status: AC
  Filled 2015-10-20: qty 2

## 2015-10-20 MED ORDER — FENTANYL CITRATE (PF) 100 MCG/2ML IJ SOLN
25.0000 ug | INTRAMUSCULAR | Status: DC | PRN
  Administered 2015-10-20 (×4): 25 ug via INTRAVENOUS

## 2015-10-20 MED ORDER — LIDOCAINE HCL (CARDIAC) 20 MG/ML IV SOLN
INTRAVENOUS | Status: DC | PRN
  Administered 2015-10-20: 100 mg via INTRAVENOUS

## 2015-10-20 SURGICAL SUPPLY — 15 items
CATH ROBINSON RED A/P 16FR (CATHETERS) ×3 IMPLANT
ELECT REM PT RETURN 9FT ADLT (ELECTROSURGICAL) ×3
ELECTRODE REM PT RTRN 9FT ADLT (ELECTROSURGICAL) ×1 IMPLANT
GLOVE BIO SURGEON STRL SZ7 (GLOVE) ×3 IMPLANT
GLOVE INDICATOR 7.5 STRL GRN (GLOVE) ×3 IMPLANT
GOWN STRL REUS W/ TWL LRG LVL3 (GOWN DISPOSABLE) ×2 IMPLANT
GOWN STRL REUS W/TWL LRG LVL3 (GOWN DISPOSABLE) ×4
IV LACTATED RINGERS 1000ML (IV SOLUTION) ×3 IMPLANT
KIT RM TURNOVER CYSTO AR (KITS) ×3 IMPLANT
PACK DNC HYST (MISCELLANEOUS) ×3 IMPLANT
PAD OB MATERNITY 4.3X12.25 (PERSONAL CARE ITEMS) ×3 IMPLANT
PAD PREP 24X41 OB/GYN DISP (PERSONAL CARE ITEMS) ×3 IMPLANT
TOWEL OR 17X26 4PK STRL BLUE (TOWEL DISPOSABLE) ×3 IMPLANT
TUBING CONNECTING 10 (TUBING) ×2 IMPLANT
TUBING CONNECTING 10' (TUBING) ×1

## 2015-10-20 NOTE — Anesthesia Procedure Notes (Signed)
Procedure Name: Intubation Date/Time: 10/20/2015 7:54 PM Performed by: Nelda Marseille Pre-anesthesia Checklist: Patient identified, Patient being monitored, Timeout performed, Emergency Drugs available and Suction available Patient Re-evaluated:Patient Re-evaluated prior to inductionOxygen Delivery Method: Circle system utilized Preoxygenation: Pre-oxygenation with 100% oxygen Intubation Type: IV induction Ventilation: Mask ventilation without difficulty Laryngoscope Size: Mac and 4 Grade View: Grade I Tube type: Oral Tube size: 7.0 mm Number of attempts: 1 Airway Equipment and Method: Stylet Placement Confirmation: ETT inserted through vocal cords under direct vision,  positive ETCO2 and breath sounds checked- equal and bilateral Secured at: 21 cm Tube secured with: Tape Dental Injury: Teeth and Oropharynx as per pre-operative assessment

## 2015-10-20 NOTE — H&P (Signed)
Date of Initial paper H&P: 10/16/2015  History reviewed, patient examined, no change in status, stable for surgery.

## 2015-10-20 NOTE — Anesthesia Postprocedure Evaluation (Signed)
Anesthesia Post Note  Patient: Susan Hubbard  Procedure(s) Performed: Procedure(s) (LRB): DILATATION AND CURETTAGE /HYSTEROSCOPY (N/A)  Patient location during evaluation: PACU Anesthesia Type: General Level of consciousness: awake and alert and oriented Pain management: pain level controlled Vital Signs Assessment: post-procedure vital signs reviewed and stable Respiratory status: spontaneous breathing Cardiovascular status: blood pressure returned to baseline Anesthetic complications: no    Last Vitals:  Filed Vitals:   10/20/15 2124 10/20/15 2131  BP:    Pulse: 70 86  Temp:    Resp: 20 23    Last Pain:  Filed Vitals:   10/20/15 2131  PainSc: 0-No pain                 Mcarthur Ivins

## 2015-10-20 NOTE — Op Note (Signed)
Patient Name: Susan Hubbard Date of Procedure: 10/20/2015  Preoperative Diagnosis: 1) 25 y.o. with menorrhagia  Postoperative Diagnosis: 1) 25 y.o. with menorrhagia  Operation Performed: Hysteroscopy, dilation and curettage  Indication: Menorrhagia  Anesthesia: General  Primary Surgeon: Malachy Mood, MD  Assistant: none  Preoperative Antibiotics: none  Estimated Blood Loss: * No blood loss amount entered *  IV Fluids: 780mL crystaloid  Urine Output:: ~373mL straight cath  Drains or Tubes: none  Implants: none  Specimens Removed: endometrial curettings  Complications: none  Intraoperative Findings:  Normal cervix, normal uterine cavity contour, thickened shaggy uterine endometrium  Patient Condition: stable  Procedure in Detail:  Patient was taken to the operating room were she was administered general endotracheal anesthesia.  She was positioned in the dorsal lithotomy position utilizing Allen stirups, prepped and draped in the usual sterile fashion.  Uterus was noted to be non-enlarged in size, anteverted   Prior to proceeding with the case a time out was performed.  Attention was turned to the patient's pelvis.  A red rubber catheter was used to empty the patient's bladder.  An operative speculum was placed to allow visualization of the cervix.  The anterior lip of the cervix was grasped with a single tooth tenaculum and the cervix was sequentially dilated using pratt dilators.  The hysteroscope was then advanced into the uterine cavity noting the above findings.  Sharp curettage was performed and the resulting specimen collected and sent to pathology.    The single tooth tenaculum was removed from the cervix.  The tenaculum sites and cervix were noted to be  Hemostatic before removing the operative speculum.  Sponge needle and instrument counts were corrects times two.  The patient tolerated the procedure well and was taken to the recovery room in stable condition.

## 2015-10-20 NOTE — Transfer of Care (Signed)
Immediate Anesthesia Transfer of Care Note  Patient: Susan Hubbard  Procedure(s) Performed: Procedure(s): DILATATION AND CURETTAGE /HYSTEROSCOPY (N/A)  Patient Location: PACU  Anesthesia Type:General  Level of Consciousness: awake, alert  and oriented  Airway & Oxygen Therapy: Patient Spontanous Breathing and Patient connected to face mask oxygen  Post-op Assessment: Report given to RN and Post -op Vital signs reviewed and stable  Post vital signs: Reviewed and stable  Last Vitals:  Filed Vitals:   10/20/15 1542 10/20/15 2030  BP: 135/83 129/81  Pulse: 90 103  Temp: 36.9 C 35.7 C  Resp: 16 22    Complications: No apparent anesthesia complications

## 2015-10-20 NOTE — Anesthesia Preprocedure Evaluation (Addendum)
Anesthesia Evaluation  Patient identified by MRN, date of birth, ID band Patient awake    Reviewed: Allergy & Precautions, NPO status , Patient's Chart, lab work & pertinent test results  Airway Mallampati: II  TM Distance: >3 FB Neck ROM: Full    Dental  (+) Chipped   Pulmonary Current Smoker,    Pulmonary exam normal breath sounds clear to auscultation       Cardiovascular negative cardio ROS Normal cardiovascular exam     Neuro/Psych negative neurological ROS  negative psych ROS   GI/Hepatic negative GI ROS, Neg liver ROS,   Endo/Other  negative endocrine ROS  Renal/GU Kidney stones  Female GU complaint     Musculoskeletal negative musculoskeletal ROS (+)   Abdominal Normal abdominal exam  (+)   Peds negative pediatric ROS (+)  Hematology  (+) anemia ,   Anesthesia Other Findings Polycystic ovarian syndrome  Reproductive/Obstetrics                           Anesthesia Physical Anesthesia Plan  ASA: II  Anesthesia Plan: General   Post-op Pain Management:    Induction: Intravenous  Airway Management Planned: Oral ETT  Additional Equipment:   Intra-op Plan:   Post-operative Plan: Extubation in OR  Informed Consent: I have reviewed the patients History and Physical, chart, labs and discussed the procedure including the risks, benefits and alternatives for the proposed anesthesia with the patient or authorized representative who has indicated his/her understanding and acceptance.   Dental advisory given  Plan Discussed with: Surgeon and CRNA  Anesthesia Plan Comments:         Anesthesia Quick Evaluation

## 2015-10-21 ENCOUNTER — Encounter: Payer: Self-pay | Admitting: Obstetrics and Gynecology

## 2015-10-22 ENCOUNTER — Encounter: Payer: Self-pay | Admitting: Obstetrics and Gynecology

## 2015-10-26 ENCOUNTER — Telehealth: Payer: Self-pay | Admitting: General Practice

## 2015-10-30 LAB — SURGICAL PATHOLOGY

## 2015-11-11 ENCOUNTER — Inpatient Hospital Stay: Payer: 59 | Attending: Obstetrics and Gynecology | Admitting: Obstetrics and Gynecology

## 2015-11-11 ENCOUNTER — Encounter: Payer: Self-pay | Admitting: Obstetrics and Gynecology

## 2015-11-11 ENCOUNTER — Inpatient Hospital Stay: Payer: 59

## 2015-11-11 VITALS — BP 129/78 | HR 73 | Temp 96.2°F | Ht 64.0 in | Wt 221.5 lb

## 2015-11-11 DIAGNOSIS — Z793 Long term (current) use of hormonal contraceptives: Secondary | ICD-10-CM | POA: Insufficient documentation

## 2015-11-11 DIAGNOSIS — E282 Polycystic ovarian syndrome: Secondary | ICD-10-CM

## 2015-11-11 DIAGNOSIS — N760 Acute vaginitis: Secondary | ICD-10-CM | POA: Diagnosis not present

## 2015-11-11 DIAGNOSIS — E669 Obesity, unspecified: Secondary | ICD-10-CM | POA: Diagnosis not present

## 2015-11-11 DIAGNOSIS — B9689 Other specified bacterial agents as the cause of diseases classified elsewhere: Secondary | ICD-10-CM | POA: Insufficient documentation

## 2015-11-11 DIAGNOSIS — D392 Neoplasm of uncertain behavior of placenta: Secondary | ICD-10-CM

## 2015-11-11 DIAGNOSIS — A499 Bacterial infection, unspecified: Secondary | ICD-10-CM

## 2015-11-11 DIAGNOSIS — E7800 Pure hypercholesterolemia, unspecified: Secondary | ICD-10-CM | POA: Insufficient documentation

## 2015-11-11 DIAGNOSIS — F17210 Nicotine dependence, cigarettes, uncomplicated: Secondary | ICD-10-CM | POA: Diagnosis not present

## 2015-11-11 DIAGNOSIS — E559 Vitamin D deficiency, unspecified: Secondary | ICD-10-CM | POA: Insufficient documentation

## 2015-11-11 HISTORY — DX: Other specified bacterial agents as the cause of diseases classified elsewhere: B96.89

## 2015-11-11 LAB — WET PREP, GENITAL
Sperm: NONE SEEN
Trich, Wet Prep: NONE SEEN
YEAST WET PREP: NONE SEEN

## 2015-11-11 NOTE — Progress Notes (Signed)
Gynecologic Oncology Consult Visit   Referring Provider: Malachy Mood, MD [address] [place], [place] [address] [phone]   Chief Concern: atypical placental site nodule  Subjective:  Susan Hubbard is a 25 y.o. female who is seen in consultation from Dr. Georgianne Fick for atypical placental site nodule.   The patient presented with menorrhagia/abnormal uterine bleeding and underwent D&C, hysteroscopy on 10/20/2015. Final pathology revealed and was reviewed at [facility]. She had abundant fragments of atypical placental site nodule/plaque. Her workup is noted below.   She has also been treated with Provera 20 mg daily (intially 20 mg TID x 7 days, then 20 mg daily) which she stopped after surgery. Then she started Sprintec OCPs. She has almost completed the active pill phase and is expecting her cycle next week. Her last LMP was 09/07/15.    09/16/2015 Pelvic US  FINDINGS: Uterus: Measurements: 4.2 x 4.8 x 8.6 cm. No fibroids or other mass visualized.  Endometrium Thickness: 15 mm. Small circumscribed anechoic oval focus along the posterior aspect of the endometrium measuring 4 mm in greatest diameter likely a parametrial cyst. Mild heterogeneity over the lower endometrial canal which may be due to hemorrhagic debris.  Right ovary Measurements: 2.7 x 3.3 x 4.5 cm. Normal appearance/no adnexal mass. Normal color flow.  Left ovary Measurements: 2.5 x 2.3 x 4.1 cm. Normal appearance/no adnexal mass. Normal color flow.  Other findings  No abnormal free fluid.  IMPRESSION: Endometrium normal in thickness with mild heterogeneity over the lower endometrium which may be due to hemorrhagic debris. 4 mm cystic structure along the posterior aspect of the endometrium possibly a parametrial cyst.  Normal ovaries.   10/20/2015 Urine pregnancy test negative  10/20/2015 Hysteroscopy: Intraoperative Findings: Normal cervix, normal uterine cavity contour, thickened shaggy  uterine endometrium  Of note her pathology from her C-section on 05/01/2014 revealed the following Diagnosis:  THIRD TRIMESTER PLACENTA:  - SMALL PLACENTA, 324 GRAMS, <3RD PERCENTILE FOR GESTATIONAL AGE.  - ACUTE CHORIOAMNIONITIS, CHORIONIC PLATE VASCULITIS, AND  FUNISITIS, ASSOCIATED WITH BACTERIA MORPHOLOGICALLY CONSISTENT  WITH STREPTOCOCCI.  - NO VILLITIS OR INFARCTION.  - THREE-VESSEL UMBILICAL CORD.   She has no complaints today and is otherwise doing well. She states she did have a serum bHCG that was negative at Dr. Danielle Rankin office, but we don't have that result.   Problem List: Patient Active Problem List   Diagnosis Date Noted  . Bacterial vaginosis 11/11/2015  . Placental site nodule 11/11/2015  . Abortion 05/18/2015  . Vitamin D deficiency 02/05/2015  . Obesity 02/05/2015  . Pure hypercholesterolemia 02/05/2015  . Kidney stones 02/05/2015  . Sleep disturbance 02/05/2015  . Acne 02/05/2015  . Irregular menses 02/05/2015  . Tobacco abuse 02/05/2015  . Eczema 02/05/2015  . History of PCOS 02/05/2015    Past Medical History: Past Medical History  Diagnosis Date  . Hyperlipidemia   . PCOS (polycystic ovarian syndrome)   . Chronic kidney disease     h/o kidney stones  . Anemia   . Bacterial vaginosis 11/11/2015    History of    Past Surgical History: Past Surgical History  Procedure Laterality Date  . Cesarean section    . Hysteroscopy w/d&c N/A 10/20/2015    Procedure: DILATATION AND CURETTAGE /HYSTEROSCOPY;  Surgeon: Malachy Mood, MD;  Location: [facility] ORS;  Service: Gynecology;  Laterality: N/A;    Past Gynecologic History:  Menarche: 13 Menstrual details: Lasts 5 days Menses regular: Yes Last Menstrual Period: 09/07/2015 History of OCP/HRT use: Yes  Last  pap: 04/15/2013 negative History of STDs: GC negative 2014 Contraception: oral contraceptives (estrogen/progesterone) Sexually active: yes   History of BV Guardasil completed.   OB History:  G2P1 (EAB 07/19/2007; C-section 05/01/2014) OB History  Gravida Para Term Preterm AB SAB TAB Ectopic Multiple Living  1         1    # Outcome Date GA Lbr Len/2nd Weight Sex Delivery Anes PTL Lv  1 Gravida               Family History: Family History  Problem Relation Age of Onset  . Diabetes Mother     type II  . Hyperlipidemia Mother   . Hypertension Mother   . GER disease Mother   . Sleep apnea Mother   . Hypertension Father   . Hyperlipidemia Maternal Grandmother   . Diabetes Maternal Grandmother     Type II  . Cancer Maternal Grandfather     Lung cancer  . Hypertension Paternal Grandmother   . Cancer Paternal Grandfather   . Diabetes Paternal Grandfather   . Hyperlipidemia Paternal Grandfather   . Cataracts Paternal Grandfather     Social History: Social History   Social History  . Marital Status: Single    Spouse Name: N/A  . Number of Children: N/A  . Years of Education: N/A   Occupational History  . Not on file.   Social History Main Topics  . Smoking status: Current Every Day Smoker -- 0.25 packs/day for 8 years    Types: Cigarettes  . Smokeless tobacco: Never Used  . Alcohol Use: No  . Drug Use: No  . Sexual Activity: Yes    Birth Control/ Protection: None   Other Topics Concern  . Not on file   Social History Narrative    Allergies: Allergies  Allergen Reactions  . Augmentin [Amoxicillin-Pot Clavulanate] Nausea And Vomiting    Has patient had a PCN reaction causing immediate rash, facial/tongue/throat swelling, SOB or lightheadedness with hypotension: no Has patient had a PCN reaction causing severe rash involving mucus membranes or skin necrosis: unknown Has patient had a PCN reaction that required hospitalization no Has patient had a PCN reaction occurring within the last 10 years: yes If all of the above answers are "NO", then may proceed with Cephalosporin use.     Current Medications: Current Outpatient Prescriptions  Medication  Sig Dispense Refill  . HYDROcodone-acetaminophen (NORCO/VICODIN) 5-325 MG tablet Take 1 tablet by mouth every 6 (six) hours as needed. 30 tablet 0  . ibuprofen (ADVIL,MOTRIN) 600 MG tablet Take 1 tablet (600 mg total) by mouth every 6 (six) hours as needed. 60 tablet 3  . medroxyPROGESTERone (PROVERA) 10 MG tablet Take 10 mg by mouth daily.    . norgestimate-ethinyl estradiol (ORTHO-CYCLEN,SPRINTEC,PREVIFEM) 0.25-35 MG-MCG tablet Take 1 tablet by mouth daily. 1 Package 3   No current facility-administered medications for this visit.    Review of Systems General: no complaints  HEENT: no complaints  Lungs: no complaints  Cardiac: no complaints  GI: no complaints  GU: no complaints  Musculoskeletal: no complaints  Extremities: no complaints  Skin: no complaints  Neuro: no complaints  Endocrine: no complaints  Psych: no complaints      Objective:  Physical Examination: BP 129/78 mmHg  Pulse 73  Temp(Src) 96.2 F (35.7 C) (Tympanic)  Ht 5\' 4"  (1.626 m)  Wt 221 lb 7.2 oz (100.45 kg)  BMI 37.99 kg/m2  LMP 09/07/2015 (Exact Date)  ECOG Performance Status: 0 - Asymptomatic  General appearance: alert, cooperative and appears stated age HEENT:PERRLA, extra ocular movement intact and sclera clear, anicteric Lymph node survey: non-palpable, axillary, inguinal, supraclavicular Cardiovascular: regular rate and rhythm Respiratory: CTA but decreased breath sounds Abdomen: soft, non-tender, without masses or organomegaly, no hernias and well healed incision Back: inspection of back is normal Extremities: extremities normal, atraumatic, no cyanosis or edema Neurological exam reveals alert, oriented, normal speech, no focal findings or movement disorder noted.  Pelvic: exam chaperoned by nurse;  Vulva: normal appearing vulva with no masses, tenderness or lesions; Vagina: normal vagina; Adnexa: No masses but limited by habitus; Uterus:  Not enlarged, and nontender but limited by habitus;  Cervix: no lesions; Rectal: not indicated    Lab Review Labs on site today: Wet prep pending  Radiologic Imaging: Pelvic ultrasound ordered    Assessment:  MENDIE MCBETH is a 25 y.o. female diagnosed with atypical placental site nodule. AUB now resolved after D&C and hormonal management. Vaginal discharge, symptomatic, suspect BV. She desires future fertility. Medical co-morbidities complicating care:obesity and tobacco usage Plan:   Problem List Items Addressed This Visit      Genitourinary   Bacterial vaginosis - Primary   Relevant Orders   Wet prep, genital     Other   Placental site nodule      We discussed options for management including atypical placental site nodule which has been associated with increased risk for concurrent or subsequent intermediate trophoblastic tumor. It was difficult to determine the reason for the atypical diagnosis specifically for her pathology.  Her placental pathology from her C-section revealed a small placenta with ACUTE CHORIOAMNIONITIS, CHORIONIC PLATE VASCULITIS. Uncertain if infection may have lead to atypical nodule. Her hysteroscopy and today's exam were reassuring.  We recommend [facility] Review at Tumor board; repeat pelvic ultrasound to assess for endometrial mass, and bHCG tumor quant. Once we have this information we will determine subsequent management.  Suggested return to clinic in  4  weeks.  The patient's diagnosis, an outline of the further diagnostic and laboratory studies which will be required, the recommendation, and alternatives were discussed.  All questions were answered to the patient's satisfaction.  A total of 45 minutes were spent with the patient/family today; 50% was spent in education, counseling and coordination of care. Susan Ends, MD     CC:  Malachy Mood, MD [address] [place], [place] [address] [phone]

## 2015-11-11 NOTE — Patient Instructions (Signed)

## 2015-11-11 NOTE — Progress Notes (Signed)
  Oncology Nurse Navigator Documentation  Navigator Location: [facility] (11/11/15 1100) Navigator Encounter Type: Clinic/MDC (Initial) (11/11/15 1100)           Patient Visit Type: Initial (Gyn Onc) (11/11/15 1100)   Barriers/Navigation Needs: No barriers at this time;Coordination of Care (11/11/15 1100)                Acuity: Level 2 (11/11/15 1100)   Acuity Level 2: Initial guidance, education and coordination as needed;Ongoing guidance and education throughout treatment as needed (11/11/15 1100)     Time Spent with Patient: 30 (11/11/15 1100)   Chaperoned with pelvic. Wet prep sent to lab

## 2015-11-12 LAB — BETA HCG QUANT (REF LAB)

## 2015-11-12 NOTE — Progress Notes (Signed)
  Oncology Nurse Navigator Documentation  Navigator Location: [facility] (11/12/15 1400) Navigator Encounter Type: Letter/Fax/Email;Diagnostic Results (11/12/15 1400)                                          Time Spent with Patient: 15 (11/12/15 1400)   Results of BHCG and wet prep sent to Dr Theora Gianotti

## 2015-11-16 ENCOUNTER — Telehealth: Payer: Self-pay

## 2015-11-16 NOTE — Telephone Encounter (Signed)
  Oncology Nurse Navigator Documentation  Navigator Location: [facility] (11/16/15 1000) Navigator Encounter Type: Telephone;Diagnostic Results (11/16/15 1000) Telephone: Outgoing Call;Diagnostic Results (11/16/15 1000)                                        Time Spent with Patient: 15 (11/16/15 1000)   Attempted to call to give results of wet prep and instruct regarding treatment. Left voicemail to return call

## 2015-11-17 MED ORDER — METRONIDAZOLE 500 MG PO TABS: 500.0000 mg | ORAL_TABLET | Freq: Two times a day (BID) | ORAL | Status: DC

## 2015-11-17 NOTE — Progress Notes (Signed)
  Oncology Nurse Navigator Documentation  Navigator Location: [facility] (11/17/15 0900) Navigator Encounter Type: Telephone (11/17/15 0900) Telephone: Outgoing Call;Diagnostic Results (11/17/15 0900)                                        Time Spent with Patient: 15 (11/17/15 0900)   Spoke with Susan Hubbard. Informed of BV on wet prep. She prefers oral abx. Per Dr Theora Gianotti, Flagyl 500mg  bid for 7 days sent to pharmacy. Pt instructed on this medication.

## 2015-11-23 ENCOUNTER — Ambulatory Visit
Admission: RE | Admit: 2015-11-23 | Discharge: 2015-11-23 | Disposition: A | Discharge status: Home / Self Care | Payer: 59 | Source: Ambulatory Visit | Attending: Obstetrics and Gynecology | Admitting: Obstetrics and Gynecology

## 2015-11-23 DIAGNOSIS — D392 Neoplasm of uncertain behavior of placenta: Secondary | ICD-10-CM | POA: Insufficient documentation

## 2015-11-23 LAB — HM PAP SMEAR

## 2015-11-27 ENCOUNTER — Telehealth: Payer: Self-pay

## 2015-11-27 NOTE — Telephone Encounter (Signed)
  Oncology Nurse Navigator Documentation  Navigator Location: [facility] (11/27/15 1000) Navigator Encounter Type: Telephone (11/27/15 1000) Telephone: Outgoing Call (11/27/15 1000)                                        Time Spent with Patient: 15 (11/27/15 1000)   Received call from Ms Susan Hubbard for results of U/S. Exam interpreted as normal. These results were given to her. Has follow up already scheduled

## 2015-12-09 ENCOUNTER — Inpatient Hospital Stay: Payer: 59

## 2015-12-16 ENCOUNTER — Inpatient Hospital Stay: Payer: 59 | Attending: Obstetrics and Gynecology | Admitting: Obstetrics and Gynecology

## 2015-12-16 ENCOUNTER — Ambulatory Visit: Payer: 59

## 2015-12-16 VITALS — BP 123/75 | HR 72 | Temp 97.0°F | Ht 63.0 in | Wt 219.4 lb

## 2015-12-16 DIAGNOSIS — Z6838 Body mass index (BMI) 38.0-38.9, adult: Secondary | ICD-10-CM | POA: Insufficient documentation

## 2015-12-16 DIAGNOSIS — N939 Abnormal uterine and vaginal bleeding, unspecified: Secondary | ICD-10-CM | POA: Insufficient documentation

## 2015-12-16 DIAGNOSIS — E282 Polycystic ovarian syndrome: Secondary | ICD-10-CM | POA: Diagnosis not present

## 2015-12-16 DIAGNOSIS — E669 Obesity, unspecified: Secondary | ICD-10-CM | POA: Diagnosis not present

## 2015-12-16 DIAGNOSIS — Z87442 Personal history of urinary calculi: Secondary | ICD-10-CM | POA: Insufficient documentation

## 2015-12-16 DIAGNOSIS — E7800 Pure hypercholesterolemia, unspecified: Secondary | ICD-10-CM | POA: Diagnosis not present

## 2015-12-16 DIAGNOSIS — E559 Vitamin D deficiency, unspecified: Secondary | ICD-10-CM | POA: Diagnosis not present

## 2015-12-16 DIAGNOSIS — Z793 Long term (current) use of hormonal contraceptives: Secondary | ICD-10-CM | POA: Insufficient documentation

## 2015-12-16 DIAGNOSIS — F17210 Nicotine dependence, cigarettes, uncomplicated: Secondary | ICD-10-CM | POA: Diagnosis not present

## 2015-12-16 DIAGNOSIS — D392 Neoplasm of uncertain behavior of placenta: Secondary | ICD-10-CM | POA: Insufficient documentation

## 2015-12-16 DIAGNOSIS — L309 Dermatitis, unspecified: Secondary | ICD-10-CM | POA: Insufficient documentation

## 2015-12-16 DIAGNOSIS — N189 Chronic kidney disease, unspecified: Secondary | ICD-10-CM | POA: Diagnosis not present

## 2015-12-16 DIAGNOSIS — D649 Anemia, unspecified: Secondary | ICD-10-CM | POA: Diagnosis not present

## 2015-12-16 DIAGNOSIS — N926 Irregular menstruation, unspecified: Secondary | ICD-10-CM | POA: Diagnosis not present

## 2015-12-16 NOTE — Progress Notes (Signed)
Patient here for follow up. She reports vaginal bleeding for the past 3 days. Using approx two pads a day bleeding irregular amounts.  Last period 5/3.

## 2015-12-16 NOTE — Patient Instructions (Addendum)
Ultrasound CLINICAL DATA: Atypical placental site nodule question endometrial mass ; underwent D & C on 10/20/2015 and abnormal cells were found  EXAM: TRANSABDOMINAL AND TRANSVAGINAL ULTRASOUND OF PELVIS  TECHNIQUE: Both transabdominal and transvaginal ultrasound examinations of the pelvis were performed. Transabdominal technique was performed for global imaging of the pelvis including uterus, ovaries, adnexal regions, and pelvic cul-de-sac. It was necessary to proceed with endovaginal exam following the transabdominal exam to visualize the endometrium and ovaries.  COMPARISON: 09/16/2015  FINDINGS: Uterus  Measurements: 7.7 x 3.7 x 5.7 cm. Normal morphology without mass.  Endometrium  Thickness: 5 mm thick, normal. No endometrial fluid or focal abnormality. No abnormal color flow within endometrium on color Doppler imaging.  Right ovary  Measurements: 4.1 x 2.5 x 2.6 cm. Normal morphology without mass. Blood flow present within RIGHT ovary on color Doppler imaging.  Left ovary  Measurements: 3.3 x 2.1 x 2.5 cm. Normal morphology without mass. Internal blood flow present on color Doppler imaging.  Other findings  No free pelvic fluid or adnexal masses.  IMPRESSION: Normal exam.   Beta hCG, Tumor Marker mIU/mL <1       Pathology Review DIAGNOSIS  A.  Endometrium, curettage, outside case, [phone], [facility], [place], [place]. Date of procedure 10-20-15:  Placental site nodule. See comment.  Comment: The outside pathology report and diagnosis of "atypical placental site nodule" has been reviewed and, overall, we agree with the assessment.  A typical benign placenta site nodule consists of a small amount of lesional tissue and diagnostic criteria allow for mild atypia, a low index of proliferation, and limited amount of necrosis.  These features are in contrast to epithelioid trophoblastic tumors which form larger lesions  with moderate atypia, increased proliferation/mitoses, and more extensive necrosis.  "Atypical placental site nodule" is a poorly defined entity for lesions that fall in the gray area between these two clearly benign and malignant entities, respectively.  The current lesion does demonstrate some intermediate features.  There is more lesional tissue than is seen in a typical benign placental site nodule.  However, all of the lesional tissue is present as well-circumscribed nodules without any areas of identifiable smooth muscle invasion (although this is limited by the specimen type.)  The outside report references a Ki-67 proliferation index (stain not available for review at [facility]) that is at the upper limit of allowable for larger placental site nodules.  Furthermore, a formal mitotic count reveals no more than 1 mitoses/10 hpfs.  Necrosis is present, but not in large confluent areas.   Only focal moderate atypia is seen.   For these reasons, it is difficult to determine whether this lesion represents a large benign placental site nodule that demonstrates some allowable variation based on its size alone, or some true features of an atypical placental site nodule, which even then has an unclear relationship with malignancy (see reference below for largest case series to date.)  It is suggested that clinicoradiological correlation be performed and the lesion be removed in its entirety with close follow-up of the patient.  Of note, serum hCG measurements have not been shown to be a reliable indicator of malignant transformation in this setting.  This case has been reviewed with Dr. Lattie Corns, who agrees with the above interpretation.  Elvis Coil D, Fisher RA, et al. Atypical placental site nodule (APSN) and association with malignant gestational trophoblastic disease; a clinicopathologic study of 21 cases. Int J Gynecol Pathol 2015;34:152-158.

## 2015-12-16 NOTE — Progress Notes (Signed)
Gynecologic Oncology Consult Visit   Referring Provider: Malachy Mood, MD [address] [place], [place] [address] [phone]   Chief Concern: atypical placental site nodule  Subjective:  Susan Hubbard is a 25 y.o. female who is seen in consultation from Dr. No ref. provider found for atypical placental site nodule.   Her evaluation included a normal pelvic exam and the results noted below.   Ultrasound FINDINGS: Uterus Measurements: 7.7 x 3.7 x 5.7 cm. Normal morphology without mass.  Endometrium Thickness: 5 mm thick, normal. No endometrial fluid or focal abnormality. No abnormal color flow within endometrium on color Doppler imaging.  Right ovary Measurements: 4.1 x 2.5 x 2.6 cm. Normal morphology without mass. Blood flow present within RIGHT ovary on color Doppler imaging.  Left ovary Measurements: 3.3 x 2.1 x 2.5 cm. Normal morphology without mass. Internal blood flow present on color Doppler imaging.  No free pelvic fluid or adnexal masses.  IMPRESSION: Normal exam.   Beta hCG, Tumor Marker mIU/mL <1       Pathology Review at [facility].  Endometrium, curettage, outside case, [phone], [facility], [place], [place]. Date of procedure 10-20-15:  Placental site nodule. See comment.  Comment: The outside pathology report and diagnosis of "atypical placental site nodule" has been reviewed and, overall, we agree with the assessment.  A typical benign placenta site nodule consists of a small amount of lesional tissue and diagnostic criteria allow for mild atypia, a low index of proliferation, and limited amount of necrosis.  These features are in contrast to epithelioid trophoblastic tumors which form larger lesions with moderate atypia, increased proliferation/mitoses, and more extensive necrosis.  "Atypical placental site nodule" is a poorly defined entity for lesions that fall in the gray area between these two clearly  benign and malignant entities, respectively.  The current lesion does demonstrate some intermediate features.  There is more lesional tissue than is seen in a typical benign placental site nodule.  However, all of the lesional tissue is present as well-circumscribed nodules without any areas of identifiable smooth muscle invasion (although this is limited by the specimen type.)  The outside report references a Ki-67 proliferation index (stain not available for review at [facility]) that is at the upper limit of allowable for larger placental site nodules.  Furthermore, a formal mitotic count reveals no more than 1 mitoses/10 hpfs.  Necrosis is present, but not in large confluent areas.   Only focal moderate atypia is seen.   For these reasons, it is difficult to determine whether this lesion represents a large benign placental site nodule that demonstrates some allowable variation based on its size alone, or some true features of an atypical placental site nodule, which even then has an unclear relationship with malignancy (see reference below for largest case series to date.)  It is suggested that clinicoradiological correlation be performed and the lesion be removed in its entirety with close follow-up of the patient.  Of note, serum hCG measurements have not been shown to be a reliable indicator of malignant transformation in this setting.  This case has been reviewed with Dr. Lattie Corns, who agrees with the above interpretation.  Elvis Coil D, Fisher RA, et al. Atypical placental site nodule (APSN) and association with malignant gestational trophoblastic disease; a clinicopathologic study of 21 cases. Int J Gynecol Pathol 2015;34:152-158.  She presents today to review her results. She has been on OCPs since her D&C in April. She also complains that she has  had bleeding (menses) twice this month. Her LMP was 11/18/2015 and last 5 days. No unusual flow. She then started to bleed again on 5/29. At her last  visit she was diagnosed with BV based on wet prep + for clue cells. She was given a prescription for metronidazole.    Gyn Oncology History   Susan Hubbard is a pleasant female who is seen in consultation from Dr. Georgianne Fick for atypical placental site nodule.  The patient presented with menorrhagia/abnormal uterine bleeding and underwent D&C, hysteroscopy on 10/20/2015. Final pathology revealed and was reviewed at [facility]. She had abundant fragments of atypical placental site nodule/plaque. Her workup is noted below.   She has also been treated with Provera 20 mg daily (intially 20 mg TID x 7 days, then 20 mg daily) which she stopped after surgery. Then she started Sprintec OCPs.  09/16/2015 Pelvic US  FINDINGS: Uterus: Measurements: 4.2 x 4.8 x 8.6 cm. No fibroids or other mass visualized.  Endometrium Thickness: 15 mm. Small circumscribed anechoic oval focus along the posterior aspect of the endometrium measuring 4 mm in greatest diameter likely a parametrial cyst. Mild heterogeneity over the lower endometrial canal which may be due to hemorrhagic debris.  Right ovary Measurements: 2.7 x 3.3 x 4.5 cm. Normal appearance/no adnexal mass. Normal color flow.  Left ovary Measurements: 2.5 x 2.3 x 4.1 cm. Normal appearance/no adnexal mass. Normal color flow.  Other findings  No abnormal free fluid.  IMPRESSION: Endometrium normal in thickness with mild heterogeneity over the lower endometrium which may be due to hemorrhagic debris. 4 mm cystic structure along the posterior aspect of the endometrium possibly a parametrial cyst.  Normal ovaries.   10/20/2015 Urine pregnancy test negative  10/20/2015 Hysteroscopy: Intraoperative Findings: Normal cervix, normal uterine cavity contour, thickened shaggy uterine endometrium  Of note her pathology from her C-section on 05/01/2014 revealed the following Diagnosis:  THIRD TRIMESTER PLACENTA:  - SMALL PLACENTA, 324 GRAMS, <3RD  PERCENTILE FOR GESTATIONAL AGE.  - ACUTE CHORIOAMNIONITIS, CHORIONIC PLATE VASCULITIS, AND  FUNISITIS, ASSOCIATED WITH BACTERIA MORPHOLOGICALLY CONSISTENT  WITH STREPTOCOCCI.  - NO VILLITIS OR INFARCTION.  - THREE-VESSEL UMBILICAL CORD.     Problem List: Patient Active Problem List   Diagnosis Date Noted  . Bacterial vaginosis 11/11/2015  . Placental site nodule 11/11/2015  . Abortion 05/18/2015  . Vitamin D deficiency 02/05/2015  . Obesity 02/05/2015  . Pure hypercholesterolemia 02/05/2015  . Kidney stones 02/05/2015  . Sleep disturbance 02/05/2015  . Acne 02/05/2015  . Irregular menses 02/05/2015  . Tobacco abuse 02/05/2015  . Eczema 02/05/2015  . History of PCOS 02/05/2015    Past Medical History: Past Medical History  Diagnosis Date  . Hyperlipidemia   . PCOS (polycystic ovarian syndrome)   . Chronic kidney disease     h/o kidney stones  . Anemia   . Bacterial vaginosis 11/11/2015    History of    Past Surgical History: Past Surgical History  Procedure Laterality Date  . Cesarean section    . Hysteroscopy w/d&c N/A 10/20/2015    Procedure: DILATATION AND CURETTAGE /HYSTEROSCOPY;  Surgeon: Malachy Mood, MD;  Location: [facility] ORS;  Service: Gynecology;  Laterality: N/A;    Past Gynecologic History:  Menarche: 13 Menstrual details: Lasts 5 days Menses regular: Yes Last Menstrual Period: as per Interval History History of OCP/HRT use: Yes  Last pap: 04/15/2013 negative History of STDs: GC negative 2014 Contraception: oral contraceptives (estrogen/progesterone) Sexually active: yes   History of BV Guardasil completed.  OB History: G2P1 (EAB 07/19/2007; C-section 05/01/2014) OB History  Gravida Para Term Preterm AB SAB TAB Ectopic Multiple Living  1         1    # Outcome Date GA Lbr Len/2nd Weight Sex Delivery Anes PTL Lv  1 Gravida               Family History: Family History  Problem Relation Age of Onset  . Diabetes Mother     type II  .  Hyperlipidemia Mother   . Hypertension Mother   . GER disease Mother   . Sleep apnea Mother   . Hypertension Father   . Hyperlipidemia Maternal Grandmother   . Diabetes Maternal Grandmother     Type II  . Cancer Maternal Grandfather     Lung cancer  . Hypertension Paternal Grandmother   . Cancer Paternal Grandfather   . Diabetes Paternal Grandfather   . Hyperlipidemia Paternal Grandfather   . Cataracts Paternal Grandfather     Social History: Social History   Social History  . Marital Status: Single    Spouse Name: N/A  . Number of Children: N/A  . Years of Education: N/A   Occupational History  . Not on file.   Social History Main Topics  . Smoking status: Current Every Day Smoker -- 0.25 packs/day for 8 years    Types: Cigarettes  . Smokeless tobacco: Never Used  . Alcohol Use: No  . Drug Use: No  . Sexual Activity: Yes    Birth Control/ Protection: None   Other Topics Concern  . Not on file   Social History Narrative    Allergies: Allergies  Allergen Reactions  . Augmentin [Amoxicillin-Pot Clavulanate] Nausea And Vomiting    Has patient had a PCN reaction causing immediate rash, facial/tongue/throat swelling, SOB or lightheadedness with hypotension: no Has patient had a PCN reaction causing severe rash involving mucus membranes or skin necrosis: unknown Has patient had a PCN reaction that required hospitalization no Has patient had a PCN reaction occurring within the last 10 years: yes If all of the above answers are "NO", then may proceed with Cephalosporin use.     Current Medications: Current Outpatient Prescriptions  Medication Sig Dispense Refill  . norgestimate-ethinyl estradiol (ORTHO-CYCLEN,SPRINTEC,PREVIFEM) 0.25-35 MG-MCG tablet Take 1 tablet by mouth daily. 1 Package 3   No current facility-administered medications for this visit.    Review of Systems General: no complaints  HEENT: no complaints  Lungs: no complaints  Cardiac: no  complaints  GI: no complaints  GU: menses to early  Musculoskeletal: no complaints  Extremities: no complaints  Skin: no complaints  Neuro: no complaints  Endocrine: no complaints  Psych: no complaints      Objective:  Physical Examination: BP 123/75 mmHg  Pulse 72  Temp(Src) 97 F (36.1 C) (Tympanic)  Ht _0  (1.6 m)  Wt 219 lb 5.7 oz (99.5 kg)  BMI 38.87 kg/m2  Exam deferred. Results of test and Tumor Board evaluation discussed  Lab Review Labs on site today: Wet prep pending  Radiologic Imaging: Pelvic ultrasound ordered    Assessment:  Jessiah E Lei is a 25 y.o. female diagnosed with atypical placental site nodule. Possible AUB versus normal menses. Vaginal discharge due to BV, resolved. She desires future fertility. Medical co-morbidities complicating care:obesity and tobacco usage Plan:   Problem List Items Addressed This Visit      Other   Placental site nodule    Other Visit Diagnoses  Vaginal bleeding    -  Primary       With regard to the atypical placental site nodule the [facility] review was reassuring as were the ultrasound and bHCG results. bHCG is not a reliable marker, but it is the best we have. I have recommended continued surveillance at 6 month intervals with exam, ultrasound and bHCG testing.   With regard to abnormal bleeding it may be that she started her cycle 2 days early which may be within normal limits for her cycle and not expected given recent start on OCPs. I recommended she continue the OCPs and to contact us if her bleeding does not stop in the next 7 days.    She is interested in fertility and would like to attempt pregnancy. I have asked her to wait for 3 months to ensure her menses are regular given that abnormal and persistent vaginal bleeding was her presentation for the placental site nodule.   Suggested return to clinic in  6  months.  The patient's diagnosis, an outline of the further diagnostic and laboratory  studies which will be required, the recommendation, and alternatives were discussed.  All questions were answered to the patient's satisfaction.  A total of 45 minutes were spent with the patient/family today; 50% was spent in education, counseling and coordination of care. Gillis Ends, MD     CC:  Malachy Mood, MD [address] [place], [place] [address] [phone]

## 2015-12-23 ENCOUNTER — Ambulatory Visit: Payer: 59

## 2016-03-02 NOTE — Telephone Encounter (Signed)
error 

## 2016-05-09 ENCOUNTER — Ambulatory Visit: Payer: 59 | Admitting: Family Medicine

## 2016-05-13 ENCOUNTER — Ambulatory Visit: Payer: 59 | Admitting: Family Medicine

## 2016-06-15 ENCOUNTER — Ambulatory Visit: Payer: 59

## 2016-06-15 ENCOUNTER — Inpatient Hospital Stay: Payer: 59 | Attending: Obstetrics and Gynecology | Admitting: Obstetrics and Gynecology

## 2016-06-15 ENCOUNTER — Encounter: Payer: Self-pay | Admitting: Obstetrics and Gynecology

## 2016-06-15 VITALS — BP 125/81 | HR 78 | Temp 98.8°F | Resp 18 | Wt 214.5 lb

## 2016-06-15 DIAGNOSIS — E7800 Pure hypercholesterolemia, unspecified: Secondary | ICD-10-CM | POA: Diagnosis not present

## 2016-06-15 DIAGNOSIS — N760 Acute vaginitis: Secondary | ICD-10-CM

## 2016-06-15 DIAGNOSIS — B9689 Other specified bacterial agents as the cause of diseases classified elsewhere: Secondary | ICD-10-CM | POA: Diagnosis not present

## 2016-06-15 DIAGNOSIS — L309 Dermatitis, unspecified: Secondary | ICD-10-CM

## 2016-06-15 DIAGNOSIS — I776 Arteritis, unspecified: Secondary | ICD-10-CM

## 2016-06-15 DIAGNOSIS — F17210 Nicotine dependence, cigarettes, uncomplicated: Secondary | ICD-10-CM | POA: Diagnosis not present

## 2016-06-15 DIAGNOSIS — N189 Chronic kidney disease, unspecified: Secondary | ICD-10-CM | POA: Diagnosis not present

## 2016-06-15 DIAGNOSIS — E669 Obesity, unspecified: Secondary | ICD-10-CM

## 2016-06-15 DIAGNOSIS — D392 Neoplasm of uncertain behavior of placenta: Secondary | ICD-10-CM

## 2016-06-15 DIAGNOSIS — E282 Polycystic ovarian syndrome: Secondary | ICD-10-CM

## 2016-06-15 DIAGNOSIS — E559 Vitamin D deficiency, unspecified: Secondary | ICD-10-CM | POA: Diagnosis not present

## 2016-06-15 DIAGNOSIS — O039 Complete or unspecified spontaneous abortion without complication: Secondary | ICD-10-CM

## 2016-06-15 NOTE — Progress Notes (Signed)
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 female who is seen in consultation from Dr. 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  Recommended continued surveillance at 6 month intervals with exam, ultrasound and bHCG testing.   At her also complained that she has had abnormal vaginal bleeding, since being on OCPs this symptom has improved and in fact she stopped having cycles until a period recently.   She saw Dr. Georgianne Fick and per her report all the results were negative. She did not have an ultrasound which we will order today. She is interested in future fertility and wants to know when she can start trying to have another child. Her daughter is 2 y.o.   She also complains of a persistent vaginal discharge - In 11/2015 she was diagnosed and treated for BV. She does note a fishy odor with the discharge. She is otherwise asymptomatic.   Gyn Oncology History   Susan Hubbard is a pleasant female who is seen in consultation from Dr. Georgianne Fick for atypical placental site nodule.  The Susan Hubbard presented with menorrhagia/abnormal uterine bleeding and underwent D&C, hysteroscopy on 10/20/2015. Final pathology revealed and was reviewed at [facility]. She had abundant fragments of atypical placental site nodule/plaque. Her workup is noted below.   She has also been treated with Provera 20 mg daily (intially 20 mg TID x 7 days, then 20 mg daily) which she stopped after surgery. Then she started Sprintec OCPs.  09/16/2015 Pelvic US  FINDINGS: Uterus: Measurements: 4.2 x 4.8 x 8.6 cm. No fibroids or other mass visualized.  Endometrium Thickness: 15 mm. Small circumscribed anechoic oval focus along the posterior aspect of the endometrium measuring 4 mm in greatest diameter likely a  parametrial cyst. Mild heterogeneity over the lower endometrial canal which may be due to hemorrhagic debris. Right ovary Measurements: 2.7 x 3.3 x 4.5 cm. Normal appearance/no adnexal mass. Left ovary Measurements: 2.5 x 2.3 x 4.1 cm. Normal appearance/no adnexal mass.  IMPRESSION: Endometrium normal in thickness with mild heterogeneity over the lower endometrium which may be due to hemorrhagic debris. 4 mm cystic structure along the posterior aspect of the endometrium possibly a parametrial cyst.  10/20/2015 Urine pregnancy test negative  10/20/2015 Hysteroscopy: Intraoperative Findings: Normal cervix, normal uterine cavity contour, thickened shaggy uterine endometrium  Of note her pathology from her C-section on 05/01/2014 revealed the following Diagnosis:  THIRD TRIMESTER PLACENTA:  - SMALL PLACENTA, 324 GRAMS, <3RD PERCENTILE FOR GESTATIONAL AGE.  - ACUTE CHORIOAMNIONITIS, CHORIONIC PLATE VASCULITIS, AND  FUNISITIS, ASSOCIATED WITH BACTERIA MORPHOLOGICALLY CONSISTENT  WITH STREPTOCOCCI.  - NO VILLITIS OR INFARCTION.  - THREE-VESSEL UMBILICAL CORD.   She was seen in consultation by [facility].   Repeat Ultrasound 11/23/2015 FINDINGS: Uterus Measurements: 7.7 x 3.7 x 5.7 cm. Normal morphology without mass.  Endometrium Thickness: 5 mm thick, normal. No endometrial fluid or focal abnormality.  Right ovary Measurements: 4.1 x 2.5 x 2.6 cm. Normal morphology without mass.  Left ovary Measurements: 3.3 x 2.1 x 2.5 cm. Normal morphology without mass.  IMPRESSION: Normal exam.  Beta hCG, Tumor Marker mIU/mL <1       Pathology Review at [facility].  Endometrium, curettage, outside case, [phone], [facility], [place], [place]. Date of procedure 10-20-15:  Placental site nodule. See comment.  Comment: The  outside pathology report and diagnosis of "atypical placental site nodule" has been reviewed and, overall, we agree with the  assessment.  A typical benign placenta site nodule consists of a small amount of lesional tissue and diagnostic criteria allow for mild atypia, a low index of proliferation, and limited amount of necrosis.  These features are in contrast to epithelioid trophoblastic tumors which form larger lesions with moderate atypia, increased proliferation/mitoses, and more extensive necrosis.  "Atypical placental site nodule" is a poorly defined entity for lesions that fall in the gray area between these two clearly benign and malignant entities, respectively.  The current lesion does demonstrate some intermediate features.  There is more lesional tissue than is seen in a typical benign placental site nodule.  However, all of the lesional tissue is present as well-circumscribed nodules without any areas of identifiable smooth muscle invasion (although this is limited by the specimen type.)  The outside report references a Ki-67 proliferation index (stain not available for review at [facility]) that is at the upper limit of allowable for larger placental site nodules.  Furthermore, a formal mitotic count reveals no more than 1 mitoses/10 hpfs.  Necrosis is present, but not in large confluent areas.   Only focal moderate atypia is seen.   For these reasons, it is difficult to determine whether this lesion represents a large benign placental site nodule that demonstrates some allowable variation based on its size alone, or some true features of an atypical placental site nodule, which even then has an unclear relationship with malignancy (see reference below for largest case series to date.)  It is suggested that clinicoradiological correlation be performed and the lesion be removed in its entirety with close follow-up of the Susan Hubbard.  Of note, serum hCG measurements have not been shown to be a reliable indicator of malignant transformation in this setting.  This case has been reviewed with Dr. Lattie Corns, who agrees with the above  interpretation.  Elvis Coil D, Fisher RA, et al. Atypical placental site nodule (APSN) and association with malignant gestational trophoblastic disease; a clinicopathologic study of 21 cases. Int J Gynecol Pathol 2015;34:152-158.  She has been on OCPs since her D&C in April.   At her last visit she was diagnosed with BV based on wet prep + for clue cells. She was given a prescription for metronidazole.   Problem List: Susan Hubbard Active Problem List   Diagnosis Date Noted  . Bacterial vaginosis 11/11/2015  . Placental site nodule 11/11/2015  . Abortion 05/18/2015  . Vitamin D deficiency 02/05/2015  . Obesity 02/05/2015  . Pure hypercholesterolemia 02/05/2015  . Kidney stones 02/05/2015  . Sleep disturbance 02/05/2015  . Acne 02/05/2015  . Irregular menses 02/05/2015  . Tobacco abuse 02/05/2015  . Eczema 02/05/2015  . History of PCOS 02/05/2015    Past Medical History: Past Medical History:  Diagnosis Date  . Anemia   . Bacterial vaginosis 11/11/2015   History of  . Chronic kidney disease    h/o kidney stones  . Hyperlipidemia   . PCOS (polycystic ovarian syndrome)     Past Surgical History: Past Surgical History:  Procedure Laterality Date  . CESAREAN SECTION    . HYSTEROSCOPY W/D&C N/A 10/20/2015   Procedure: DILATATION AND CURETTAGE /HYSTEROSCOPY;  Surgeon: Malachy Mood, MD;  Location: [facility] ORS;  Service: Gynecology;  Laterality: N/A;    Past Gynecologic History:  Menarche: 13 Menstrual details: Lasts 5 days Menses regular: Yes Last Menstrual Period: as per Interval History History  of OCP/HRT use: Yes  Last pap: Per Susan Hubbard she had a negative Pap with Dr. Georgianne Fick 05/2016 History of STDs: GC negative 2014 Contraception: oral contraceptives (estrogen/progesterone) Sexually active: yes   History of BV Guardasil completed.   OB History: G2P1 (EAB 07/19/2007; C-section 05/01/2014) OB History  Gravida Para Term Preterm AB Living  1         1  SAB TAB Ectopic  Multiple Live Births               # Outcome Date GA Lbr Len/2nd Weight Sex Delivery Anes PTL Lv  1 Gravida               Family History: Family History  Problem Relation Age of Onset  . Diabetes Mother     type II  . Hyperlipidemia Mother   . Hypertension Mother   . GER disease Mother   . Sleep apnea Mother   . Hypertension Father   . Hyperlipidemia Maternal Grandmother   . Diabetes Maternal Grandmother     Type II  . Cancer Maternal Grandfather     Lung cancer  . Hypertension Paternal Grandmother   . Cancer Paternal Grandfather   . Diabetes Paternal Grandfather   . Hyperlipidemia Paternal Grandfather   . Cataracts Paternal Grandfather     Social History: Social History   Social History  . Marital status: Single    Spouse name: N/A  . Number of children: N/A  . Years of education: N/A   Occupational History  . Not on file.   Social History Main Topics  . Smoking status: Current Every Day Smoker    Packs/day: 0.25    Years: 8.00    Types: Cigarettes  . Smokeless tobacco: Never Used  . Alcohol use No  . Drug use: No  . Sexual activity: Yes    Birth control/ protection: None   Other Topics Concern  . Not on file   Social History Narrative  . No narrative on file    Allergies: Allergies  Allergen Reactions  . Augmentin [Amoxicillin-Pot Clavulanate] Nausea And Vomiting    Has Susan Hubbard had a PCN reaction causing immediate rash, facial/tongue/throat swelling, SOB or lightheadedness with hypotension: no Has Susan Hubbard had a PCN reaction causing severe rash involving mucus membranes or skin necrosis: unknown Has Susan Hubbard had a PCN reaction that required hospitalization no Has Susan Hubbard had a PCN reaction occurring within the last 10 years: yes If all of the above answers are "NO", then may proceed with Cephalosporin use.     Current Medications: Current Outpatient Prescriptions  Medication Sig Dispense Refill  . norethindrone-ethinyl estradiol (JUNEL  FE,GILDESS FE,LOESTRIN FE) 1-20 MG-MCG tablet Take 1 tablet by mouth daily.     No current facility-administered medications for this visit.     Review of Systems General: no complaints  HEENT: no complaints  Lungs: no complaints  Cardiac: no complaints  GI: no complaints  GU: vaginal discharge  Musculoskeletal: no complaints  Extremities: no complaints  Skin: no complaints  Neuro: no complaints  Endocrine: no complaints  Psych: no complaints      Objective:  Physical Examination: BP 125/81 (BP Location: Left Arm, Susan Hubbard Position: Sitting)   Pulse 78   Temp 98.8 F (37.1 C) (Tympanic)   Resp 18   Wt 214 lb 8.1 oz (97.3 kg)   BMI 38.00 kg/m   ECOG Performance Status: 0 - Asymptomatic  General appearance: alert, cooperative and appears stated age HEENT:PERRLA, extra ocular movement intact  and sclera clear, anicteric Abdomen: soft, non-tender, without masses or organomegaly, no hernias and well healed incision Extremities: extremities normal, atraumatic, no cyanosis or edema Neurological exam reveals alert, oriented, normal speech, no focal findings or movement disorder noted.  Pelvic: exam chaperoned by nurse;  Vulva: normal appearing vulva with no masses, tenderness or lesions; Vagina: normal vagina, minimal white discharge, no odor; Adnexa: No masses but limited by habitus; Uterus:  Not enlarged, and nontender but limited by habitus; Cervix: no lesions; Rectal: not indicated  Lab Review Labs on site today: bHCG pending  Radiologic Imaging: Pelvic ultrasound ordered    Assessment:  ANSHU WEHNER is a 25 y.o. female diagnosed with atypical placental site nodule, clinically doing well. Vaginal discharge most likely due to persistent BV, asymptomatic. She desires future fertility. Medical co-morbidities complicating care:obesity and tobacco usage Plan:   Problem List Items Addressed This Visit      Other   Placental site nodule - Primary   Relevant Orders    US Pelvis Complete   US Transvaginal Non-OB   Pregnancy, urine   Beta HCG, Quant      With regard to the atypical placental site nodule the [facility] review was reassuring as were the ultrasound and bHCG results. bHCG is not a reliable marker, but it is the best we have. I have recommended continued surveillance at 6 month intervals with exam, ultrasound and bHCG testing. The optimal length of surveillance is unknown. She is very interested in conceiving again and if this ultrasound is reassuring I think that is reasonable.  With regard to discharge we will not submit for testing or treat at this point since discharge is minimal and she is not symptomatic. She will follow up with Dr. Georgianne Fick.  Suggested return to clinic in  6  months if she is not pregnant. If pregnant I can see her postpartum.  The Susan Hubbard's diagnosis, an outline of the further diagnostic and laboratory studies which will be required, the recommendation, and alternatives were discussed.  All questions were answered to the Susan Hubbard's satisfaction.  A total of 45 minutes were spent with the Susan Hubbard in education, counseling and coordination of care. Gillis Ends, MD     CC:  Malachy Mood, MD [address] [place], [place] [address] [phone]

## 2016-06-15 NOTE — Progress Notes (Signed)
  Oncology Nurse Navigator Documentation Chaperoned pelvic exam. For U/S and follow up in 6 months Navigator Location: [facility] (06/15/16 0800)   )Navigator Encounter Type: Follow-up Appt (06/15/16 0800)                                                    Time Spent with Patient: 15 (06/15/16 0800)

## 2016-06-16 LAB — BETA HCG QUANT (REF LAB): Beta hCG, Tumor Marker: <1 m[IU]/mL

## 2016-06-17 ENCOUNTER — Ambulatory Visit: Admission: RE | Admit: 2016-06-17 | Payer: 59 | Source: Ambulatory Visit

## 2016-06-21 ENCOUNTER — Telehealth: Payer: Self-pay

## 2016-06-21 NOTE — Telephone Encounter (Signed)
  Oncology Nurse Navigator Documentation Notified of Bhcg results. She had to reschedule U/S. Beta hCG, Tumor Marker mIU/mL <1      Navigator Location: [facility] (06/21/16 0900)   )Navigator Encounter Type: Diagnostic Results;Telephone (06/21/16 0900) Telephone: Outgoing Call;Diagnostic Results (06/21/16 0900)                                                  Time Spent with Patient: 15 (06/21/16 0900)

## 2016-07-25 ENCOUNTER — Telehealth: Payer: Self-pay

## 2016-07-25 NOTE — Telephone Encounter (Signed)
  Oncology Nurse Navigator Documentation Voicemail left with Ms. To return call to follow up regarding U/S Navigator Location: [facility] (07/25/16 1500)   )Navigator Encounter Type: Telephone (07/25/16 1500) Telephone: [place] Call (07/25/16 1500)                                                  Time Spent with Patient: 15 (07/25/16 1500)

## 2016-08-17 ENCOUNTER — Ambulatory Visit (INDEPENDENT_AMBULATORY_CARE_PROVIDER_SITE_OTHER): Payer: Self-pay | Admitting: Podiatry

## 2016-08-17 DIAGNOSIS — B9689 Other specified bacterial agents as the cause of diseases classified elsewhere: Secondary | ICD-10-CM | POA: Diagnosis not present

## 2016-08-17 DIAGNOSIS — N760 Acute vaginitis: Secondary | ICD-10-CM | POA: Diagnosis not present

## 2016-08-17 DIAGNOSIS — Z113 Encounter for screening for infections with a predominantly sexual mode of transmission: Secondary | ICD-10-CM | POA: Diagnosis not present

## 2016-08-17 DIAGNOSIS — N898 Other specified noninflammatory disorders of vagina: Secondary | ICD-10-CM | POA: Diagnosis not present

## 2016-08-17 DIAGNOSIS — L600 Ingrowing nail: Secondary | ICD-10-CM

## 2016-08-17 NOTE — Progress Notes (Signed)
She persists today with a chief complaint of a painful ingrown toenail to the hallux right along the tibial and fibular border. States his been present for quite some time and exquisitely painful.  Objective: Vital signs are stable alert and oriented 3. Pulses are palpable. Radial nail margin on the tibia and fibular border of the hallux right with pain. Mild erythematous cellulitis drainage or odor.  Assessment: Ingrown toenail paronychia Says hallux right.  Plan: Chemical matrixectomy was performed to the hallux right today after local anesthesia was administered she tolerated this procedure well without palpitations. She is provided with oral and written home-going instructions for care and soaking for toe as well as a prescription for Kerlix were noted to be applied twice daily after soaking. Follow up with her in 1 week.

## 2016-08-18 ENCOUNTER — Ambulatory Visit: Payer: 59 | Admitting: Podiatry

## 2016-08-30 ENCOUNTER — Other Ambulatory Visit: Payer: Self-pay

## 2016-08-30 MED ORDER — DOXYCYCLINE HYCLATE 100 MG PO TABS: 100.0000 mg | ORAL_TABLET | Freq: Two times a day (BID) | ORAL | 0 refills | Status: DC

## 2016-09-15 ENCOUNTER — Encounter: Payer: Self-pay | Admitting: Family Medicine

## 2016-09-15 ENCOUNTER — Ambulatory Visit (INDEPENDENT_AMBULATORY_CARE_PROVIDER_SITE_OTHER): Payer: 59 | Admitting: Family Medicine

## 2016-09-15 DIAGNOSIS — M79641 Pain in right hand: Secondary | ICD-10-CM

## 2016-09-15 MED ORDER — DICLOFENAC SODIUM 75 MG PO TBEC: 75.0000 mg | DELAYED_RELEASE_TABLET | Freq: Two times a day (BID) | ORAL | 1 refills | Status: DC

## 2016-09-15 NOTE — Patient Instructions (Signed)
You have an extensor tendinitis of your hand (specifically your pinky finger) Take diclofenac 75mg  twice a day with food for pain and inflammation - take for 7-10 days then as needed. Icing 15 minutes at a time 3-4 times a day. Use the splint or buddy taping as much as possible to rest this. Consider occupational therapy if this doesn't improve as expected. Follow up in 6 weeks typically.

## 2016-09-15 NOTE — Progress Notes (Signed)
PCP: No PCP Per Patient  Subjective:   HPI: Patient is a 26 y.o. female here for right hand pain.  Patient reports she has had right hand pain for about 8 months. No known injury or trauma. 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 Is right handed. Pain was in palmar side but now exclusively dorsal. Worse with squeezing. Radiation into pinky with some numbness here. Wears a brace at times at work. No skin changes.  Past Medical History:  Diagnosis Date  . Anemia   . Bacterial vaginosis 11/11/2015   History of  . Chronic kidney disease    h/o kidney stones  . Hyperlipidemia   . PCOS (polycystic ovarian syndrome)     Current Outpatient Prescriptions on File Prior to Visit  Medication Sig Dispense Refill  . doxycycline (VIBRA-TABS) 100 MG tablet Take 1 tablet (100 mg total) by mouth 2 (two) times daily. 10 tablet 0  . norethindrone-ethinyl estradiol (JUNEL FE,GILDESS FE,LOESTRIN FE) 1-20 MG-MCG tablet Take 1 tablet by mouth daily.     No current facility-administered medications on file prior to visit.     Past Surgical History:  Procedure Laterality Date  . CESAREAN SECTION    . HYSTEROSCOPY W/D&C N/A 10/20/2015   Procedure: DILATATION AND CURETTAGE /HYSTEROSCOPY;  Surgeon: Malachy Mood, MD;  Location: [facility] ORS;  Service: Gynecology;  Laterality: N/A;    Allergies  Allergen Reactions  . Augmentin [Amoxicillin-Pot Clavulanate] Nausea And Vomiting    Has patient had a PCN reaction causing immediate rash, facial/tongue/throat swelling, SOB or lightheadedness with hypotension: no Has patient had a PCN reaction causing severe rash involving mucus membranes or skin necrosis: unknown Has patient had a PCN reaction that required hospitalization no Has patient had a PCN reaction occurring within the last 10 years: yes If all of the above answers are "NO", then may proceed with Cephalosporin use.     Social History   Social History  . Marital status:  Single    Spouse name: N/A  . Number of children: N/A  . Years of education: N/A   Occupational History  . Not on file.   Social History Main Topics  . Smoking status: Current Every Day Smoker    Packs/day: 0.25    Years: 8.00    Types: Cigarettes  . Smokeless tobacco: Never Used  . Alcohol use No  . Drug use: No  . Sexual activity: Yes    Birth control/ protection: None   Other Topics Concern  . Not on file   Social History Narrative  . No narrative on file    Family History  Problem Relation Age of Onset  . Diabetes Mother     type II  . Hyperlipidemia Mother   . Hypertension Mother   . GER disease Mother   . Sleep apnea Mother   . Hypertension Father   . Hyperlipidemia Maternal Grandmother   . Diabetes Maternal Grandmother     Type II  . Cancer Maternal Grandfather     Lung cancer  . Hypertension Paternal Grandmother   . Cancer Paternal Grandfather   . Diabetes Paternal Grandfather   . Hyperlipidemia Paternal Grandfather   . Cataracts Paternal Grandfather     BP 106/74   Ht 5\' 3"  (1.6 m)   Wt 212 lb (96.2 kg)   BMI 37.55 kg/m   Review of Systems: See HPI above.     Objective:  Physical Exam:  Gen: NAD, comfortable in exam  room  Right hand/wrist: No gross deformity, swelling, bruising, atrophy. TTP dorsal hand over extensor tendons of 2nd-5th digits, worst 5th digit. No focal bony tenderness or other tenderness of hand or wrist. FROM digits, wrist with pain on resisted finger extension 2nd-5th digits. Collateral ligaments intact of digits. Strength 5/5 all digits and wrist. Sensation intact to light touch.   Assessment & Plan:  1. Right hand pain - consistent with extensor tendinitis.  Start with diclofenac, icing, splinting.  Consider occupational therapy if not improving as expected.  F/u in 6 weeks.

## 2016-09-20 DIAGNOSIS — M79641 Pain in right hand: Secondary | ICD-10-CM | POA: Insufficient documentation

## 2016-09-20 NOTE — Assessment & Plan Note (Signed)
consistent with extensor tendinitis.  Start with diclofenac, icing, splinting.  Consider occupational therapy if not improving as expected.  F/u in 6 weeks.

## 2016-10-05 ENCOUNTER — Telehealth: Payer: Self-pay

## 2016-10-05 NOTE — Telephone Encounter (Signed)
Pt called stating AMS had talked to her about starting fertility workup/pills b/c she hasn't had a period on her own in over a month.  How does she start?  [phone]

## 2016-10-06 MED ORDER — MEDROXYPROGESTERONE ACETATE 10 MG PO TABS: 10.0000 mg | ORAL_TABLET | Freq: Every day | ORAL | 0 refills | Status: DC

## 2016-10-06 NOTE — Telephone Encounter (Signed)
So I'm going out of town tell her to start the pills this Monday and let me know when she has the first day of her next period. Starting Monday should ensure I'm back by the time she starts.  Rx for provera sent

## 2016-10-07 NOTE — Telephone Encounter (Signed)
Pt aware of AMS message. She will call when she starts her cycle. KJ CMA

## 2016-10-31 ENCOUNTER — Telehealth: Payer: Self-pay

## 2016-10-31 NOTE — Telephone Encounter (Signed)
It will take 7-10 days after finishing the pills (taking the last pill) to start a period if it's been that long then she needs to take a pregnancy test

## 2016-10-31 NOTE — Telephone Encounter (Signed)
Please advise 

## 2016-10-31 NOTE — Telephone Encounter (Signed)
Pt calling.  She took the pills AMS rx'd and period still hasn't started.  Not sure what to do.  She is at work today - lunch 12-1; off at [place]

## 2016-10-31 NOTE — Telephone Encounter (Signed)
Pt aware of AMS message. She states she will call once she starts/or not to see what plan is. KJ CMA

## 2016-11-07 ENCOUNTER — Other Ambulatory Visit: Payer: Self-pay | Admitting: Obstetrics and Gynecology

## 2016-11-07 ENCOUNTER — Telehealth: Payer: Self-pay

## 2016-11-07 DIAGNOSIS — N970 Female infertility associated with anovulation: Secondary | ICD-10-CM

## 2016-11-07 MED ORDER — CLOMIPHENE CITRATE 50 MG PO TABS: 50.0000 mg | ORAL_TABLET | Freq: Every day | ORAL | 0 refills | Status: DC

## 2016-11-07 NOTE — Telephone Encounter (Signed)
Pt calling for AMS. Stated he told her to let her know when her cycle started. She started on Saturday. Pt aware I will send this msg to AMS/his nurse and they will give her a call back.

## 2016-11-07 NOTE — Telephone Encounter (Signed)
Rx has been sent and I left message for patient to start this Wednesday 4/25, return to clinic 5/10 for progesterone lab

## 2016-11-07 NOTE — Telephone Encounter (Signed)
Please advise 

## 2016-11-09 NOTE — Telephone Encounter (Signed)
Pt is schedule 11/24/16 for lab

## 2016-11-24 ENCOUNTER — Other Ambulatory Visit: Payer: 59

## 2016-11-24 DIAGNOSIS — N970 Female infertility associated with anovulation: Secondary | ICD-10-CM | POA: Diagnosis not present

## 2016-11-25 ENCOUNTER — Telehealth: Payer: Self-pay

## 2016-11-25 LAB — PROGESTERONE: Progesterone: 8.9 ng/mL

## 2016-11-25 NOTE — Telephone Encounter (Signed)
Pt calling today for lab results. I informed patient AMS is out of the office today but once he gets message and reviews results he would call her back. Pt was asking what the plan would be and she was advised AMS would also go over that with her as well. KJ CMA  CB# [phone]

## 2016-11-28 NOTE — Telephone Encounter (Signed)
Pt calling again regarding lab results & plan.

## 2016-11-28 NOTE — Telephone Encounter (Signed)
You can let her know the value was good and she did ovulate this month.

## 2016-11-29 NOTE — Telephone Encounter (Signed)
Pt aware of AMS message 

## 2016-11-29 NOTE — Telephone Encounter (Signed)
She is to a) call with her next menstrual cycle, if she isn't pregnant should happen within 7-10 days of the labs b) if she doesn't have a period take a pregnancy test.  If she does have a period we will keep the dose the same and no need to do labs again this month

## 2016-11-29 NOTE — Telephone Encounter (Signed)
Patient was asking if there was a plan. Please advise and I will call her back

## 2016-11-30 ENCOUNTER — Ambulatory Visit (INDEPENDENT_AMBULATORY_CARE_PROVIDER_SITE_OTHER): Payer: 59 | Admitting: Physician Assistant

## 2016-11-30 ENCOUNTER — Encounter: Payer: Self-pay | Admitting: Physician Assistant

## 2016-11-30 VITALS — BP 114/72 | HR 88 | Temp 98.7°F | Resp 16 | Wt 224.0 lb

## 2016-11-30 DIAGNOSIS — R0602 Shortness of breath: Secondary | ICD-10-CM

## 2016-11-30 DIAGNOSIS — R079 Chest pain, unspecified: Secondary | ICD-10-CM | POA: Diagnosis not present

## 2016-11-30 NOTE — Patient Instructions (Signed)
Chest Wall Pain Chest wall pain is pain in or around the bones and muscles of your chest. Sometimes, an injury causes this pain. Sometimes, the cause may not be known. This pain may take several weeks or longer to get better. Follow these instructions at home: Pay attention to any changes in your symptoms. Take these actions to help with your pain:  Rest as told by your doctor.  Avoid activities that cause pain. Try not to use your chest, belly (abdominal), or side muscles to lift heavy things.  If directed, apply ice to the painful area:  Put ice in a plastic bag.  Place a towel between your skin and the bag.  Leave the ice on for 20 minutes, 2-3 times per day.  Take over-the-counter and prescription medicines only as told by your doctor.  Do not use tobacco products, including cigarettes, chewing tobacco, and e-cigarettes. If you need help quitting, ask your doctor.  Keep all follow-up visits as told by your doctor. This is important. Contact a doctor if:  You have a fever.  Your chest pain gets worse.  You have new symptoms. Get help right away if:  You feel sick to your stomach (nauseous) or you throw up (vomit).  You feel sweaty or light-headed.  You have a cough with phlegm (sputum) or you cough up blood.  You are short of breath. This information is not intended to replace advice given to you by your health care provider. Make sure you discuss any questions you have with your health care provider. Document Released: 12/21/2007 Document Revised: 12/10/2015 Document Reviewed: 09/29/2014 Elsevier Interactive Patient Education  2017 [employer].

## 2016-11-30 NOTE — Progress Notes (Signed)
Patient: Susan Hubbard Female    DOB: May 26, 1991   26 y.o.   MRN: [id number] Visit Date: 11/30/2016  Today's Provider: Trinna Post, PA-C   Chief Complaint  Patient presents with  . Chest Pain    Started Monday night.    Subjective:    Chest Pain   This is a new problem. The current episode started in the past 7 days. The problem has been gradually improving. The quality of the pain is described as heavy. The pain does not radiate. Associated symptoms include a cough, lower extremity edema, malaise/fatigue, palpitations, shortness of breath, sputum production and weakness. Pertinent negatives include no abdominal pain, back pain, diaphoresis, dizziness, exertional chest pressure, fever, headaches, nausea, near-syncope, numbness or vomiting. The cough is productive. She has tried nothing for the symptoms.  Pertinent negatives for past medical history include no seizures.   Susan Hubbard is a 26 y/o woman with history of obesity, current tobacco use, and estrogen OCP last taken in [facility] April presenting with chest heaviness since Monday. She reports it started suddenly and was associated with SOB, but no leg swelling, N/V, diaphoresis. She laid down to go to sleep. She woke up next morning with improvement in the pain but nevertheless persistent pain. She denies association with eating. Initially, the chest pain was worse when she breathed. No injuries. No anxiety or increased stress. History of MI in paternal uncle in his 54's. It does get worse at night when she is lying down. She is trying to conceive and so is no longer on her birth control. She does not wish to take any medications.     Allergies  Allergen Reactions  . Augmentin [Amoxicillin-Pot Clavulanate] Nausea And Vomiting    Has patient had a PCN reaction causing immediate rash, facial/tongue/throat swelling, SOB or lightheadedness with hypotension: no Has patient had a PCN reaction causing severe rash involving mucus  membranes or skin necrosis: unknown Has patient had a PCN reaction that required hospitalization no Has patient had a PCN reaction occurring within the last 10 years: yes If all of the above answers are "NO", then may proceed with Cephalosporin use.      Current Outpatient Prescriptions:  .  clomiPHENE (CLOMID) 50 MG tablet, Take 1 tablet (50 mg total) by mouth daily. (Patient not taking: Reported on 11/30/2016), Disp: 5 tablet, Rfl: 0 .  diclofenac (VOLTAREN) 75 MG EC tablet, Take 1 tablet (75 mg total) by mouth 2 (two) times daily. (Patient not taking: Reported on 11/30/2016), Disp: 60 tablet, Rfl: 1 .  doxycycline (VIBRA-TABS) 100 MG tablet, Take 1 tablet (100 mg total) by mouth 2 (two) times daily. (Patient not taking: Reported on 11/30/2016), Disp: 10 tablet, Rfl: 0 .  medroxyPROGESTERone (PROVERA) 10 MG tablet, Take 1 tablet (10 mg total) by mouth daily. Use for ten days (Patient not taking: Reported on 11/30/2016), Disp: 10 tablet, Rfl: 0 .  norethindrone-ethinyl estradiol (JUNEL FE,GILDESS FE,LOESTRIN FE) 1-20 MG-MCG tablet, Take 1 tablet by mouth daily., Disp: , Rfl:   Review of Systems  Constitutional: Positive for fatigue and malaise/fatigue. Negative for activity change, appetite change, chills, diaphoresis, fever and unexpected weight change.  Respiratory: Positive for cough, sputum production and shortness of breath. Negative for apnea, choking, chest tightness, wheezing and stridor.   Cardiovascular: Positive for chest pain and palpitations. Negative for near-syncope.  Gastrointestinal: Negative.  Negative for abdominal pain, nausea and vomiting.  Musculoskeletal: Negative.  Negative for back pain.  Neurological:  Positive for weakness. Negative for dizziness, seizures, syncope, speech difficulty, light-headedness, numbness and headaches.  Hematological: Negative for adenopathy. Does not bruise/bleed easily.    Social History  Substance Use Topics  . Smoking status: Current Every  Day Smoker    Packs/day: 0.25    Years: 8.00    Types: Cigarettes  . Smokeless tobacco: Never Used  . Alcohol use No   Objective:   BP 114/72 (BP Location: Right Arm, Patient Position: Sitting, Cuff Size: Large)   Pulse 88   Temp 98.7 F (37.1 C) (Oral)   Resp 16   Wt 224 lb (101.6 kg)   BMI 39.68 kg/m  Vitals:   11/30/16 1559  BP: 114/72  Pulse: 88  Resp: 16  Temp: 98.7 F (37.1 C)  TempSrc: Oral  Weight: 224 lb (101.6 kg)     Physical Exam  Constitutional: She is oriented to person, place, and time. She appears well-developed and well-nourished. No distress.  Cardiovascular: Normal rate, regular rhythm and normal heart sounds.   Pulmonary/Chest: Effort normal and breath sounds normal. No respiratory distress. She has no wheezes. She has no rales. She exhibits no tenderness.  Abdominal: Soft.  Musculoskeletal: She exhibits no edema or tenderness.  Neurological: She is alert and oriented to person, place, and time.  Skin: Skin is warm and dry.  Psychiatric: She has a normal mood and affect. Her behavior is normal.        Assessment & Plan:     1. Chest pain, unspecified type  EKG in office was normal today. Pain seems to be improving, though still some concern for cardiac etiology or PE considering her weight, smoking, and prior OCP use. Will get labs as below. Patient instructed to proceed to emergency room if she has worsening pain, SOB, fatigue, dizziness.  - EKG 12-Lead - D-Dimer, Quantitative - Troponin I  2. SOB (shortness of breath)  - D-Dimer, Quantitative - Troponin I  Return if symptoms worsen or fail to improve, depending on labs.  The entirety of the information documented in the History of Present Illness, Review of Systems and Physical Exam were personally obtained by me. Portions of this information were initially documented by Ashley Royalty, CMA and reviewed by me for thoroughness and accuracy.   I have spent 25 minutes with this patient, >50%  of which was spent on counseling and coordination of care.       Trinna Post, PA-C  [facility]

## 2016-12-01 ENCOUNTER — Telehealth: Payer: Self-pay

## 2016-12-01 LAB — TROPONIN I: Troponin I: <0.01 ng/mL (ref 0.00–0.04)

## 2016-12-01 LAB — D-DIMER, QUANTITATIVE: D-DIMER: 0.21 mg/L FEU (ref 0.00–0.49)

## 2016-12-01 NOTE — Telephone Encounter (Signed)
-----   Message from Trinna Post, [place] sent at 12/01/2016  8:37 AM EDT ----- D-Dimer and troponin were negative. These are both pretty good signs that patient doesn't have clot anywhere or is having serious problems with blood flow to heart. Not quite sure what was the cause of her symptoms, but she should call us if she's not continuing to improve.

## 2016-12-01 NOTE — Telephone Encounter (Signed)
Advised pt of lab results. Pt verbally acknowledges understanding. Emily Drozdowski, CMA   

## 2016-12-08 ENCOUNTER — Telehealth: Payer: Self-pay

## 2016-12-08 NOTE — Telephone Encounter (Signed)
Pt calling.  She has had a positive preg test.  What are the next steps?  [phone]

## 2016-12-08 NOTE — Telephone Encounter (Signed)
Please advise 

## 2016-12-13 ENCOUNTER — Other Ambulatory Visit (HOSPITAL_COMMUNITY): Payer: 59

## 2016-12-13 ENCOUNTER — Encounter (HOSPITAL_COMMUNITY): Payer: Self-pay | Admitting: *Deleted

## 2016-12-13 ENCOUNTER — Emergency Department (HOSPITAL_COMMUNITY)
Admission: EM | Admit: 2016-12-13 | Discharge: 2016-12-13 | Disposition: A | Discharge status: Home / Self Care | Payer: 59 | Attending: Emergency Medicine | Admitting: Emergency Medicine

## 2016-12-13 ENCOUNTER — Emergency Department (HOSPITAL_COMMUNITY): Payer: 59

## 2016-12-13 ENCOUNTER — Telehealth: Payer: Self-pay

## 2016-12-13 DIAGNOSIS — N760 Acute vaginitis: Secondary | ICD-10-CM | POA: Diagnosis not present

## 2016-12-13 DIAGNOSIS — F17210 Nicotine dependence, cigarettes, uncomplicated: Secondary | ICD-10-CM | POA: Insufficient documentation

## 2016-12-13 DIAGNOSIS — O99331 Smoking (tobacco) complicating pregnancy, first trimester: Secondary | ICD-10-CM | POA: Insufficient documentation

## 2016-12-13 DIAGNOSIS — Z3A01 Less than 8 weeks gestation of pregnancy: Secondary | ICD-10-CM | POA: Insufficient documentation

## 2016-12-13 DIAGNOSIS — O23591 Infection of other part of genital tract in pregnancy, first trimester: Secondary | ICD-10-CM | POA: Diagnosis not present

## 2016-12-13 DIAGNOSIS — O200 Threatened abortion: Secondary | ICD-10-CM | POA: Insufficient documentation

## 2016-12-13 DIAGNOSIS — N189 Chronic kidney disease, unspecified: Secondary | ICD-10-CM | POA: Insufficient documentation

## 2016-12-13 DIAGNOSIS — O4690 Antepartum hemorrhage, unspecified, unspecified trimester: Secondary | ICD-10-CM

## 2016-12-13 DIAGNOSIS — B9689 Other specified bacterial agents as the cause of diseases classified elsewhere: Secondary | ICD-10-CM | POA: Diagnosis not present

## 2016-12-13 DIAGNOSIS — O209 Hemorrhage in early pregnancy, unspecified: Secondary | ICD-10-CM | POA: Diagnosis not present

## 2016-12-13 LAB — CBC
HCT: 36.7 % (ref 36.0–46.0)
Hemoglobin: 12.2 g/dL (ref 12.0–15.0)
MCH: 28.8 pg (ref 26.0–34.0)
MCHC: 33.2 g/dL (ref 30.0–36.0)
MCV: 86.6 fL (ref 78.0–100.0)
PLATELETS: 314 10*3/uL (ref 150–400)
RBC: 4.24 MIL/uL (ref 3.87–5.11)
RDW: 13.4 % (ref 11.5–15.5)
WBC: 8.7 10*3/uL (ref 4.0–10.5)

## 2016-12-13 LAB — WET PREP, GENITAL
Sperm: NONE SEEN
Trich, Wet Prep: NONE SEEN
Yeast Wet Prep HPF POC: NONE SEEN

## 2016-12-13 LAB — HCG, QUANTITATIVE, PREGNANCY: HCG, BETA CHAIN, QUANT, S: 2698 m[IU]/mL — AB (ref <5)

## 2016-12-13 MED ORDER — METRONIDAZOLE 500 MG PO TABS: 500.0000 mg | ORAL_TABLET | Freq: Two times a day (BID) | ORAL | 0 refills | Status: DC

## 2016-12-13 NOTE — Discharge Instructions (Signed)
CALL OBGYN TO SCHEDULE FOLLOW UP APPOINTMENT. RETURN TO ER OR [place] IF YOU HAVE HEAVY BLEEDING OR SEVERE ABDOMINAL CRAMPING.

## 2016-12-13 NOTE — ED Triage Notes (Signed)
Pt reports she is about [redacted] weeks pregnant, did a home pregnancy test last week.  Reports having vaginal bleeding with a large clot.  Have had abd cramping x 2 weeks.  She reports hx of PCOS and have been on clomid.  Have not contacted her OB.  Denies any spotting prior to this am.  Denies any vag bleeding now.

## 2016-12-13 NOTE — Telephone Encounter (Signed)
Pt is schedule first available appt 01/12/17 with AMS.

## 2016-12-13 NOTE — Telephone Encounter (Signed)
  Oncology Nurse Navigator Documentation Noted that Ms. Fister had ED visit 5/29 and she is pregnant. Per Dr. Gershon Crane last note:  Suggested return to clinic in 6 months if she is not pregnant. If pregnant I can see her postpartum.  I will ask Dr. Theora Gianotti 5/30 how soon post partum she would like to see her and we will arrange this appointment. Appointment for 5/30 cancelled. Voiced understanding. Navigator Location: [facility] (12/13/16 1600)   )Navigator Encounter Type: Telephone (12/13/16 1600) Telephone: [place] Crocker Call;Appt Confirmation/Clarification (12/13/16 1600)                                                  Time Spent with Patient: 15 (12/13/16 1600)

## 2016-12-13 NOTE — ED Notes (Signed)
See triage note please

## 2016-12-13 NOTE — ED Provider Notes (Signed)
[place] DEPT Provider Note   CSN: [id number] Arrival date & time: 12/13/16  0902     History   Chief Complaint Chief Complaint  Patient presents with  . Vaginal Bleeding    HPI Susan Hubbard is a 26 y.o. female.  26yo F [id number] w/ h/o HLD, kidney stones who p/w vaginal bleeding. Pt had positive UPT last week, LMP was 4/20. She notes taking clomid for 5 days starting on 4/25 but is not currently on the medication. She has had 2 weeks of intermittent, mild lower abdominal cramping. This morning, she passed one large blood clot but has not had any ongoing bleeding. She reports that the cramps have been mildly worse this morning. She has not had any other bleeding associated with this pregnancy. She has not yet seen an OB/GYN but has contacted for an appointment. She reports recent vaginal discharge similar to an episode of bacterial vaginosis that she has had in the past. She denies any nausea, vomiting, fevers, or recent illness.   The history is provided by the patient.    Past Medical History:  Diagnosis Date  . Anemia   . Bacterial vaginosis 11/11/2015   History of  . Chronic kidney disease    h/o kidney stones  . Hyperlipidemia   . PCOS (polycystic ovarian syndrome)     Patient Active Problem List   Diagnosis Date Noted  . Right hand pain 09/20/2016  . Bacterial vaginosis 11/11/2015  . Placental site nodule 11/11/2015  . Abortion 05/18/2015  . Vitamin D deficiency 02/05/2015  . Obesity 02/05/2015  . Pure hypercholesterolemia 02/05/2015  . Kidney stones 02/05/2015  . Sleep disturbance 02/05/2015  . Acne 02/05/2015  . Irregular menses 02/05/2015  . Tobacco abuse 02/05/2015  . Eczema 02/05/2015  . History of PCOS 02/05/2015    Past Surgical History:  Procedure Laterality Date  . CESAREAN SECTION    . HYSTEROSCOPY W/D&C N/A 10/20/2015   Procedure: DILATATION AND CURETTAGE /HYSTEROSCOPY;  Surgeon: Malachy Mood, MD;  Location: [facility] ORS;  Service:  Gynecology;  Laterality: N/A;    OB History    Gravida Para Term Preterm AB Living   1         1   SAB TAB Ectopic Multiple Live Births                   Home Medications    Prior to Admission medications   Medication Sig Start Date End Date Taking? Authorizing Provider  clomiPHENE (CLOMID) 50 MG tablet Take 1 tablet (50 mg total) by mouth daily. 11/07/16  Yes Malachy Mood, MD  diclofenac (VOLTAREN) 75 MG EC tablet Take 1 tablet (75 mg total) by mouth 2 (two) times daily. Patient not taking: Reported on 11/30/2016 09/15/16   Dene Gentry, MD  doxycycline (VIBRA-TABS) 100 MG tablet Take 1 tablet (100 mg total) by mouth 2 (two) times daily. Patient not taking: Reported on 11/30/2016 08/30/16   Edrick Kins, DPM  medroxyPROGESTERone (PROVERA) 10 MG tablet Take 1 tablet (10 mg total) by mouth daily. Use for ten days Patient not taking: Reported on 11/30/2016 10/06/16   Malachy Mood, MD  metroNIDAZOLE (FLAGYL) 500 MG tablet Take 1 tablet (500 mg total) by mouth 2 (two) times daily. 12/13/16   Muriah Harsha, Wenda Overland, MD    Family History Family History  Problem Relation Age of Onset  . Diabetes Mother        type II  . Hyperlipidemia Mother   . Hypertension  Mother   . GER disease Mother   . Sleep apnea Mother   . Hypertension Father   . Hyperlipidemia Maternal Grandmother   . Diabetes Maternal Grandmother        Type II  . Cancer Maternal Grandfather        Lung cancer  . Hypertension Paternal Grandmother   . Cancer Paternal Grandfather   . Diabetes Paternal Grandfather   . Hyperlipidemia Paternal Grandfather   . Cataracts Paternal Grandfather     Social History Social History  Substance Use Topics  . Smoking status: Current Every Day Smoker    Packs/day: 0.25    Years: 8.00    Types: Cigarettes  . Smokeless tobacco: Never Used  . Alcohol use No     Allergies   Augmentin [amoxicillin-pot clavulanate]   Review of Systems Review of Systems All other  systems reviewed and are negative except that which was mentioned in HPI   Physical Exam Updated Vital Signs BP 124/80 (BP Location: Right Arm)   Pulse 87   Temp 99.1 F (37.3 C) (Oral)   Resp 17   Ht 5\' 3"  (1.6 m)   LMP 11/04/2016   SpO2 95%   Physical Exam  Constitutional: She is oriented to person, place, and time. She appears well-developed and well-nourished. No distress.  HENT:  Head: Normocephalic and atraumatic.  Moist mucous membranes  Eyes: Conjunctivae are normal. Pupils are equal, round, and reactive to light.  Neck: Neck supple.  Cardiovascular: Normal rate, regular rhythm and normal heart sounds.   No murmur heard. Pulmonary/Chest: Effort normal and breath sounds normal.  Abdominal: Soft. Bowel sounds are normal. She exhibits no distension. There is no tenderness.  Genitourinary:  Genitourinary Comments: Chaperone was present during exam. Moderate amount of white discharge in vaginal vault, mildly friable cervix, no cervical motion or adnexal tenderness  Musculoskeletal: She exhibits no edema.  Neurological: She is alert and oriented to person, place, and time.  Fluent speech  Skin: Skin is warm and dry.  Psychiatric: She has a normal mood and affect. Judgment normal.  Nursing note and vitals reviewed.    ED Treatments / Results  Labs (all labs ordered are listed, but only abnormal results are displayed) Labs Reviewed  WET PREP, GENITAL - Abnormal; Notable for the following:       Result Value   Clue Cells Wet Prep HPF POC PRESENT (*)    WBC, Wet Prep HPF POC MODERATE (*)    All other components within normal limits  HCG, QUANTITATIVE, PREGNANCY - Abnormal; Notable for the following:    hCG, Beta Chain, Quant, S 2,698 (*)    All other components within normal limits  CBC  GC/CHLAMYDIA PROBE AMP ([place]) NOT AT [facility]    EKG  EKG Interpretation None       Radiology US Ob Comp < 14 Wks  Result Date: 12/13/2016 CLINICAL DATA:  Vaginal  bleeding with positive pregnancy test. EXAM: OBSTETRIC <14 WK [place] AND TRANSVAGINAL OB US TECHNIQUE: Both transabdominal and transvaginal ultrasound examinations were performed for complete evaluation of the gestation as well as the maternal uterus, adnexal regions, and pelvic cul-de-sac. Transvaginal technique was performed to assess early pregnancy. COMPARISON:  None. FINDINGS: Intrauterine gestational sac: Single. Yolk sac:  Visualized. Embryo:  Not visualized MSD: 6.4  mm   5 w   2  d Subchorionic hemorrhage:  None visualized. Maternal uterus/adnexae: Unremarkable. Trace free fluid evident in the cul-de-sac. IMPRESSION: Single intrauterine gestational sac identified  with yolk sac visible but no embryo yet evident. Mean sac diameter estimates a 5 week 2 day gestational age. Follow-up ultrasound in 7-10 days could be used to ensure appropriate pregnancy progression. Electronically Signed   By: Misty Stanley M.D.   On: 12/13/2016 12:37   US Ob Transvaginal  Result Date: 12/13/2016 CLINICAL DATA:  Vaginal bleeding with positive pregnancy test. EXAM: OBSTETRIC <14 WK [place] AND TRANSVAGINAL OB US TECHNIQUE: Both transabdominal and transvaginal ultrasound examinations were performed for complete evaluation of the gestation as well as the maternal uterus, adnexal regions, and pelvic cul-de-sac. Transvaginal technique was performed to assess early pregnancy. COMPARISON:  None. FINDINGS: Intrauterine gestational sac: Single. Yolk sac:  Visualized. Embryo:  Not visualized MSD: 6.4  mm   5 w   2  d Subchorionic hemorrhage:  None visualized. Maternal uterus/adnexae: Unremarkable. Trace free fluid evident in the cul-de-sac. IMPRESSION: Single intrauterine gestational sac identified with yolk sac visible but no embryo yet evident. Mean sac diameter estimates a 5 week 2 day gestational age. Follow-up ultrasound in 7-10 days could be used to ensure appropriate pregnancy progression. Electronically Signed   By: Misty Stanley M.D.    On: 12/13/2016 12:37    Procedures Procedures (including critical care time)  Medications Ordered in ED Medications - No data to display   Initial Impression / Assessment and Plan / ED Course  I have reviewed the triage vital signs and the nursing notes.  Pertinent labs & imaging results that were available during my care of the patient were reviewed by me and considered in my medical decision making (see chart for details).    Pt w/ recent +UPT, recently on clomid, who p/w passage of 1 blood clot and recent Lower abdominal cramping. She was well-appearing with normal vital signs on exam. She had no lower abdominal tenderness, no bleeding on ultrasound but moderate amount of white discharge, clue cells on wet prep. I reviewed her chart which shows blood type O+. CBC normal. Obtained ultrasound to rule out ectopic pregnancy.  HCG is 2698. Wet prep shows clue cells, I suspect repeat bacterial vaginosis given her history of the same. Ultrasound shows single intrauterine gestational sac with yolk sac but no embryo yet, measuring 5 weeks and 2 days. Given the early dates it is possible that lack of embryo is just due to early ultrasound, however I have instructed the patient to follow-up within 1 week with her OB/GYN for repeat evaluation. Extensively reviewed return precautions including heavy vaginal bleeding or severe abdominal cramping. Counseled on pelvic rest and routine first trimester care. Provided with Flagyl to treat BV. Patient discharged in satisfactory condition.  Final Clinical Impressions(s) / ED Diagnoses   Final diagnoses:  Threatened miscarriage  Vaginal bleeding in pregnancy  BV (bacterial vaginosis)    New Prescriptions New Prescriptions   METRONIDAZOLE (FLAGYL) 500 MG TABLET    Take 1 tablet (500 mg total) by mouth 2 (two) times daily.     Saloni Lablanc, Wenda Overland, MD 12/13/16 1311

## 2016-12-13 NOTE — Telephone Encounter (Signed)
Arrange NOB for her in the next 1-2 weeks

## 2016-12-13 NOTE — Telephone Encounter (Signed)
Please schedule with any provider

## 2016-12-13 NOTE — ED Notes (Signed)
Patient transported to Ultrasound 

## 2016-12-13 NOTE — ED Notes (Signed)
ED Provider at bedside. 

## 2016-12-14 ENCOUNTER — Inpatient Hospital Stay: Payer: 59

## 2016-12-14 LAB — GC/CHLAMYDIA PROBE AMP (~~LOC~~) NOT AT ARMC
Chlamydia: POSITIVE — AB
NEISSERIA GONORRHEA: NEGATIVE

## 2016-12-20 ENCOUNTER — Telehealth: Payer: Self-pay

## 2016-12-20 NOTE — Telephone Encounter (Signed)
  Oncology Nurse Navigator Documentation Called and notified Susan Hubbard that per Dr. Theora Gianotti she can wait until 3 months post partum to follow up with her. Instructed to call for appointment at that time. Navigator Location: [facility] (12/20/16 1400)   )Navigator Encounter Type: Telephone (12/20/16 1400) Telephone: [place] Call (12/20/16 1400)                                                  Time Spent with Patient: 15 (12/20/16 1400)

## 2017-01-12 ENCOUNTER — Encounter: Payer: 59 | Admitting: Obstetrics and Gynecology

## 2017-02-15 ENCOUNTER — Ambulatory Visit (INDEPENDENT_AMBULATORY_CARE_PROVIDER_SITE_OTHER): Payer: 59 | Admitting: Podiatry

## 2017-02-15 DIAGNOSIS — M722 Plantar fascial fibromatosis: Secondary | ICD-10-CM

## 2017-02-17 NOTE — Progress Notes (Signed)
   Subjective: Patient presents today for pain and tenderness in the feet bilaterally. Patient states the foot pain has been hurting for several weeks now. Patient states that it hurts in the mornings with the first steps out of bed. Patient presents today for further treatment and evaluation  Objective: Physical Exam General: The patient is alert and oriented x3 in no acute distress.  Dermatology: Skin is warm, dry and supple bilateral lower extremities. Negative for open lesions or macerations bilateral.   Vascular: Dorsalis Pedis and Posterior Tibial pulses palpable bilateral.  Capillary fill time is immediate to all digits.  Neurological: Epicritic and protective threshold intact bilateral.   Musculoskeletal: Tenderness to palpation at the medial calcaneal tubercale and through the insertion of the plantar fascia of the bilateral feet. All other joints range of motion within normal limits bilateral. Strength 5/5 in all groups bilateral.   Radiographic exam: Normal osseous mineralization. Joint spaces preserved. No fracture/dislocation/boney destruction. Calcaneal spur present with mild thickening of plantar fascia bilateral. No other soft tissue abnormalities or radiopaque foreign bodies.   Assessment: 1. plantar fasciitis bilateral feet  Plan of Care:  1. Patient evaluated.  2. I discussed with the patient different treatment modalities including anti-inflammatory injections, oral anti-inflammatory, conservative modalities including plantar fascial bracing. At the moment the patient wants to continue with conservative modalities. 3. Plantar fascial brace was dispensed to the bilateral feet 4. Continue icing and stretching exercises 5. Return to clinic when necessary  Edrick Kins, DPM [facility]  Dr. Edrick Kins, DPM    [address], [place] [address]                Office [phone]  Fax [phone]

## 2017-02-27 ENCOUNTER — Ambulatory Visit: Payer: 59

## 2017-06-28 ENCOUNTER — Encounter (INDEPENDENT_AMBULATORY_CARE_PROVIDER_SITE_OTHER): Payer: Self-pay

## 2017-07-19 ENCOUNTER — Telehealth: Payer: 59 | Admitting: Family

## 2017-07-19 DIAGNOSIS — N898 Other specified noninflammatory disorders of vagina: Secondary | ICD-10-CM

## 2017-07-19 NOTE — Progress Notes (Signed)
Based on what you shared with me it looks like you have a serious condition that should be evaluated in a face to face office visit.  NOTE: Even if you have entered your credit card information for this eVisit, you will not be charged.   If you are having a true medical emergency please call 911.  If you need an urgent face to face visit, [facility] has four urgent care centers for your convenience.  If you need care fast and have a high deductible or no insurance consider:   [email or web address]  [phone]  [address], [address] [place], [place] [address] 8 am to 8 pm Monday-Friday 10 am to 4 pm Saturday-Sunday   The following sites will take your  insurance:    . [facility]  [phone] Get Driving Directions Find a Provider at this Location  [address] [place], [place] [address] . 10 am to 8 pm Monday-Friday . 12 pm to 8 pm Saturday-Sunday   . [facility]  [phone] Get Driving Directions Find a Provider at this Location  [address], [address] [place], [place] [address] . 8 am to 8 pm Monday-Friday . 9 am to 6 pm Saturday . 11 am to 6 pm Sunday   . [facility]  [phone] Get Driving Directions  [address] [place],  [address] . 8 am to 8 pm Monday-Friday . 8 am to 4 pm Saturday-Sunday   Your e-visit answers were reviewed by a board certified advanced clinical practitioner to complete your personal care plan.  Thank you for using e-Visits.  

## 2017-08-18 ENCOUNTER — Encounter: Payer: 59 | Admitting: Physician Assistant

## 2017-08-31 ENCOUNTER — Ambulatory Visit: Payer: 59 | Admitting: Obstetrics and Gynecology

## 2017-08-31 ENCOUNTER — Encounter: Payer: Self-pay | Admitting: Obstetrics and Gynecology

## 2017-08-31 NOTE — Progress Notes (Deleted)
PCP:  Patient, No Pcp Per   No chief complaint on file.    HPI:      Susan Hubbard is a 27 y.o. G1P0 who LMP was No LMP recorded., presents today for her annual examination.  Her menses are {norm/abn:715}, lasting {number:22536} days.  Dysmenorrhea {dysmen:716}. She {does:18564} have intermenstrual bleeding.  Sex activity: {sex active:315163}.  Last Pap: May 27, 2016  Results were: no abnormalities  Hx of STDs: chlamydia  Last mammogram: {date:304500300}  Results were: {norm/abn:13465} There is no FH of breast cancer. There is no FH of ovarian cancer. The patient {does:18564} do self-breast exams.  Tobacco use: {tob:20664} Alcohol use: {Alcohol:11675} No drug use.  Exercise: {exercise:31265}  She {does:18564} get adequate calcium and Vitamin D in her diet.  Gardasil completed   Past Medical History:  Diagnosis Date  . Anemia   . Bacterial vaginosis 11/11/2015   History of  . Chronic kidney disease    h/o kidney stones  . Hyperlipidemia   . PCOS (polycystic ovarian syndrome)     Past Surgical History:  Procedure Laterality Date  . CESAREAN SECTION    . HYSTEROSCOPY W/D&C N/A 10/20/2015   Procedure: DILATATION AND CURETTAGE /HYSTEROSCOPY;  Surgeon: Malachy Mood, MD;  Location: [facility];  Service: Gynecology;  Laterality: N/A;    Family History  Problem Relation Age of Onset  . Diabetes Mother        type II  . Hyperlipidemia Mother   . Hypertension Mother   . GER disease Mother   . Sleep apnea Mother   . Hypertension Father   . Hyperlipidemia Maternal Grandmother   . Diabetes Maternal Grandmother        Type II  . Cancer Maternal Grandfather        Lung cancer  . Hypertension Paternal Grandmother   . Cancer Paternal Grandfather   . Diabetes Paternal Grandfather   . Hyperlipidemia Paternal Grandfather   . Cataracts Paternal Grandfather     Social History   Socioeconomic History  . Marital status: Single    Spouse name: Not on file    . Number of children: Not on file  . Years of education: Not on file  . Highest education level: Not on file  Social Needs  . Financial resource strain: Not on file  . Food insecurity - worry: Not on file  . Food insecurity - inability: Not on file  . Transportation needs - medical: Not on file  . Transportation needs - non-medical: Not on file  Occupational History  . Not on file  Tobacco Use  . Smoking status: Current Every Day Smoker    Packs/day: 0.25    Years: 8.00    Pack years: 2.00    Types: Cigarettes  . Smokeless tobacco: Never Used  Substance and Sexual Activity  . Alcohol use: No  . Drug use: No  . Sexual activity: Yes    Birth control/protection: None  Other Topics Concern  . Not on file  Social History Narrative  . Not on file    No outpatient medications have been marked as taking for the 08/31/17 encounter (Appointment) with Copland, Deirdre Evener, PA-C.     ROS:  Review of Systems   Objective: There were no vitals taken for this visit.   OBGyn Exam  Results: No results found for this or any previous visit (from the past 24 hour(s)).  Assessment/Plan: No diagnosis found.  No orders of the defined types were placed in  this encounter.            GYN counsel {counseling:16159}     F/U  No Follow-up on file.  Alicia B. Copland, PA-C 08/31/2017 10:43 AM

## 2018-01-04 IMAGING — US US PELVIS COMPLETE
1 series · 13 of 25 positions shown · non-contrast
Comparison: 09/16/2015

CLINICAL DATA: Atypical placental site nodule question endometrial
mass ; underwent D & C on 10/20/2015 and abnormal cells were found

EXAM:
TRANSABDOMINAL AND TRANSVAGINAL ULTRASOUND OF PELVIS
TECHNIQUE: Both transabdominal and transvaginal ultrasound examinations of the
pelvis were performed. Transabdominal technique was performed for
global imaging of the pelvis including uterus, ovaries, adnexal
regions, and pelvic cul-de-sac. It was necessary to proceed with
endovaginal exam following the transabdominal exam to visualize the
endometrium and ovaries.

[Series 1: us pelvis complete · 0.23mm/px · 13 of 146 slices shown]
[im 1/146]
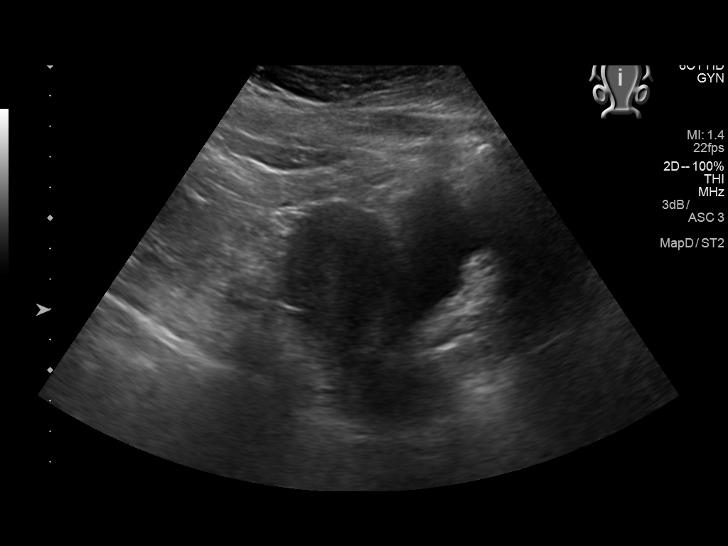
[im 13/146]
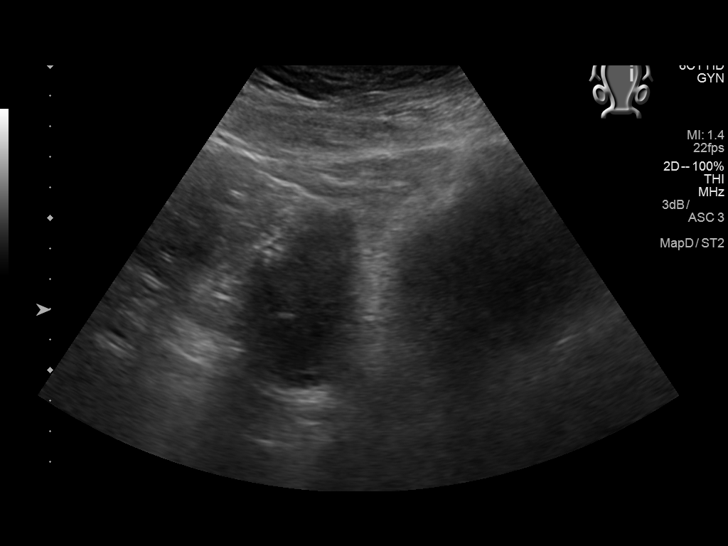
[im 25/146]
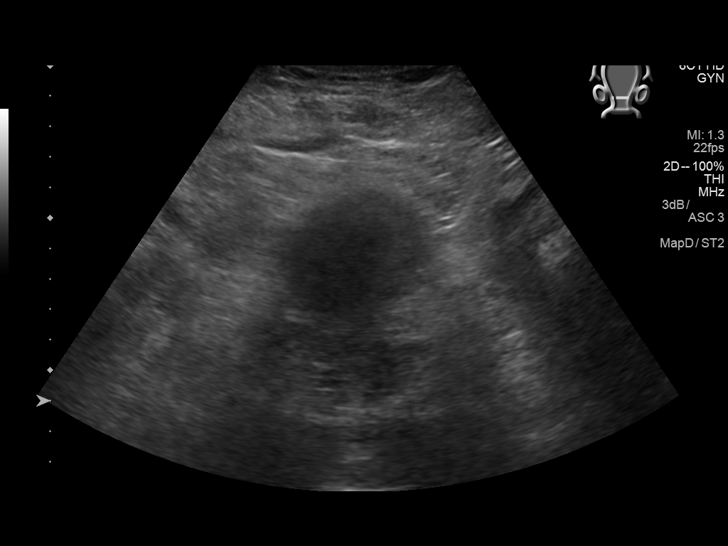
[im 37/146]
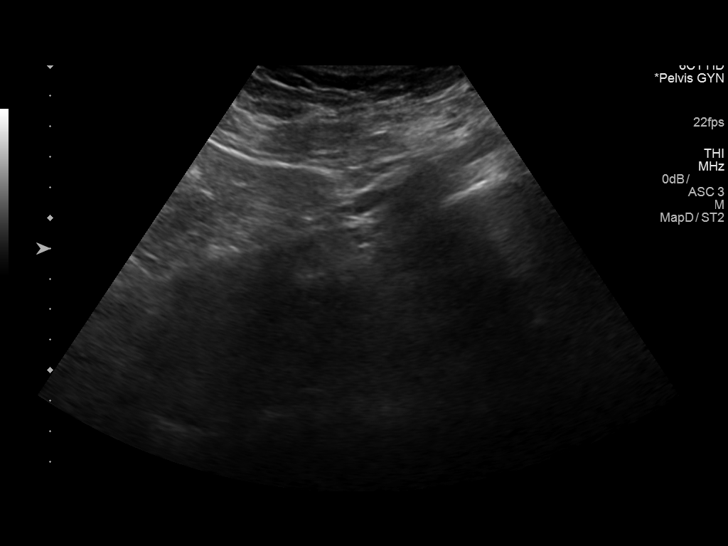
[im 49/146]
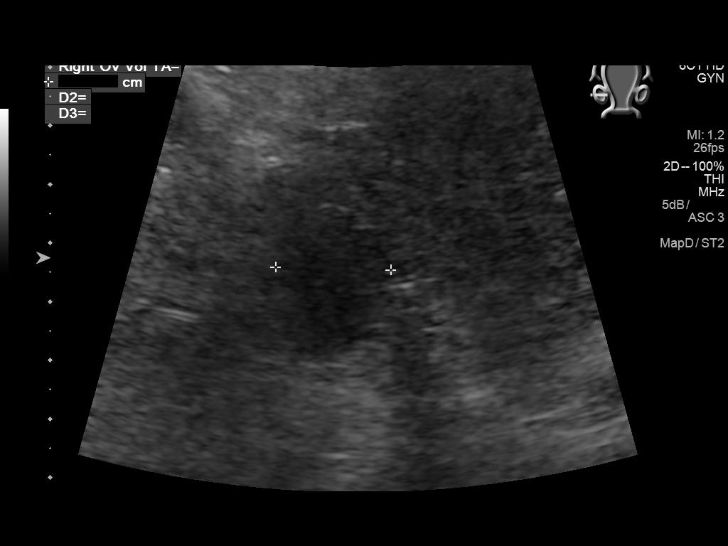
[im 61/146]
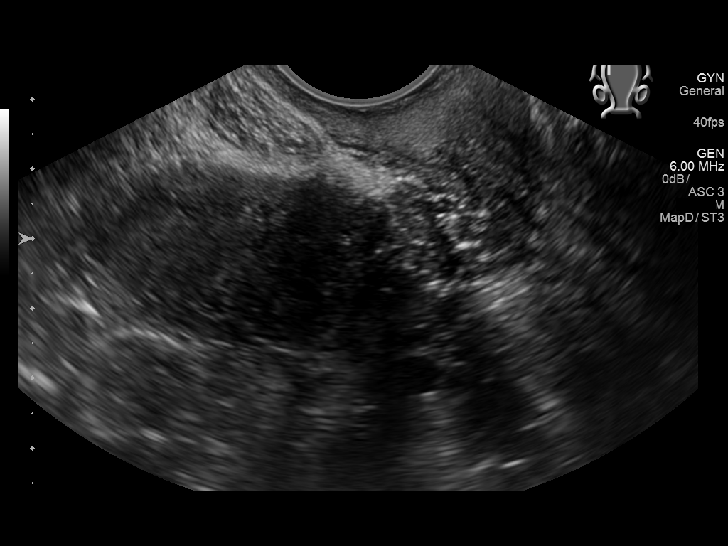
[im 73/146]
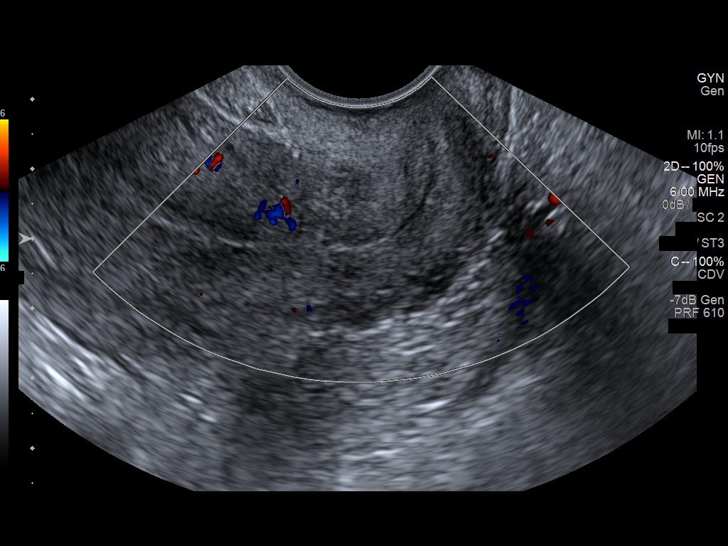
[im 85/146]
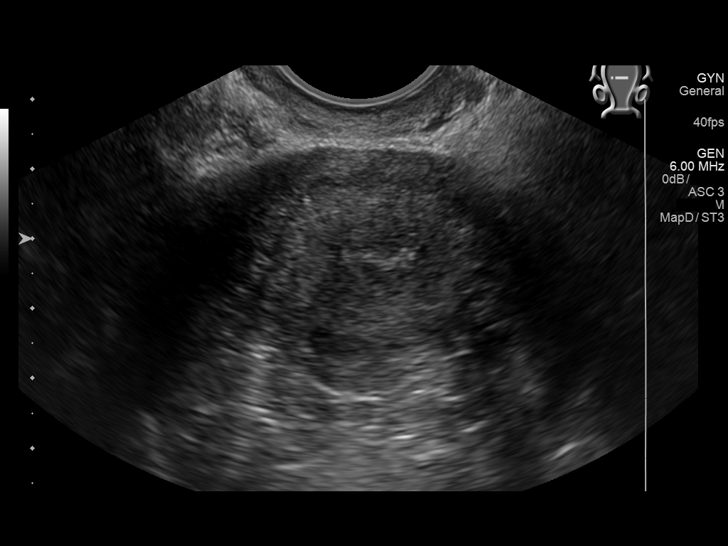
[im 97/146]
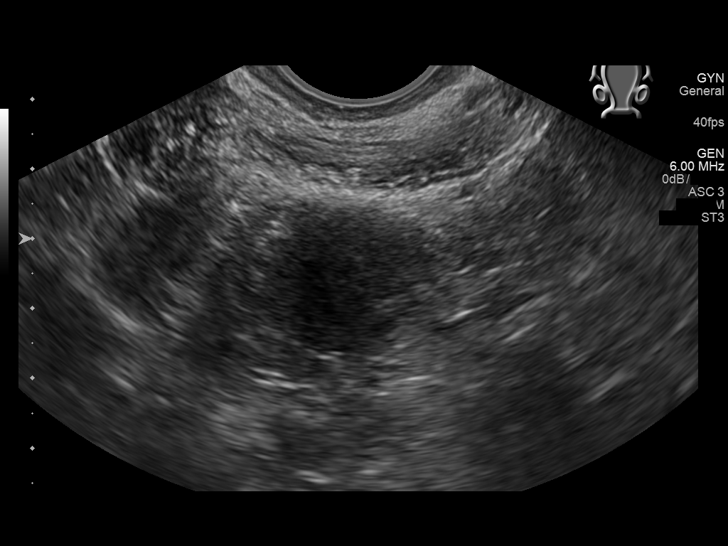
[im 109/146]
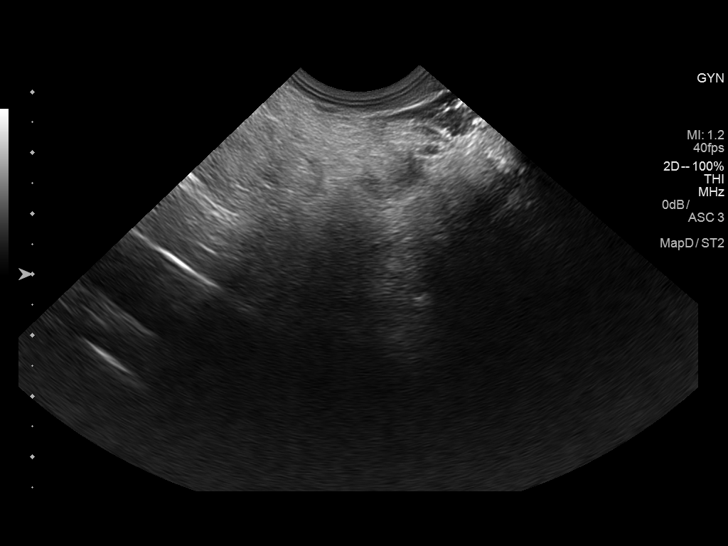
[im 121/146]
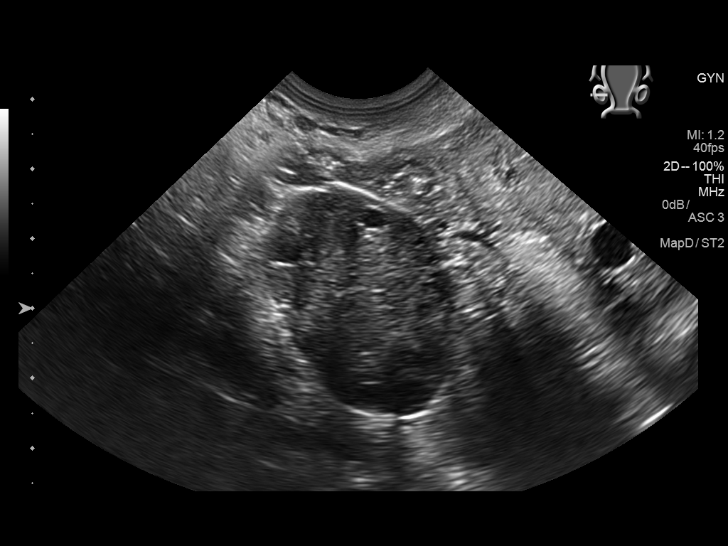
[im 133/146]
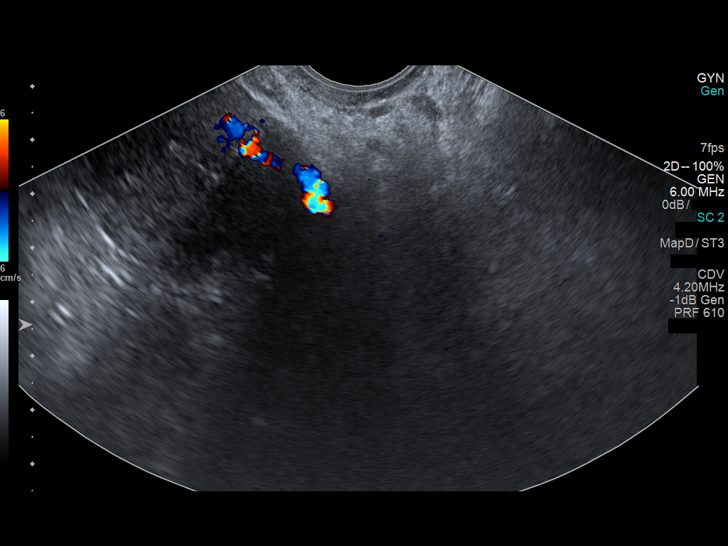
[im 146/146]
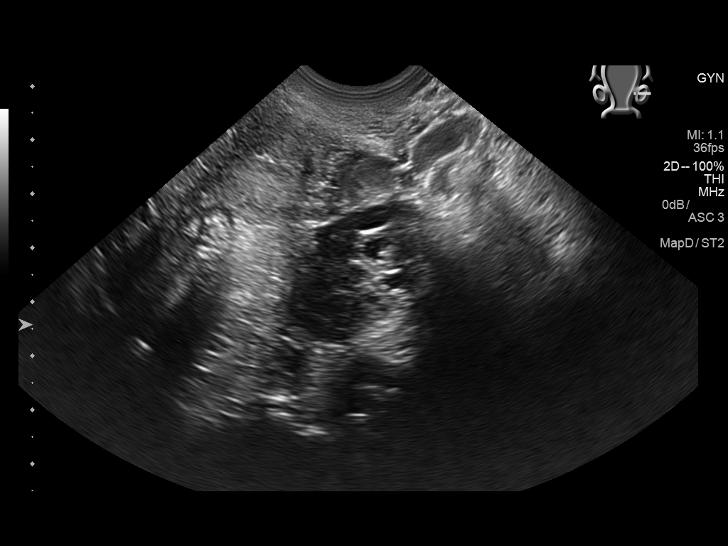

[13 of 25 positions shown; findings below may reference images not displayed]

FINDINGS: Uterus

Measurements: 7.7 x 3.7 x 5.7 cm. Normal morphology without mass.

Endometrium

Thickness: 5 mm thick, normal. No endometrial fluid or focal
abnormality. No abnormal color flow within endometrium on color
Doppler imaging.

Right ovary

Measurements: 4.1 x 2.5 x 2.6 cm. Normal morphology without mass.
Blood flow present within RIGHT ovary on color Doppler imaging.

Left ovary

Measurements: 3.3 x 2.1 x 2.5 cm. Normal morphology without mass.
Internal blood flow present on color Doppler imaging.

Other findings

No free pelvic fluid or adnexal masses.
IMPRESSION: Normal exam.

## 2018-05-25 ENCOUNTER — Ambulatory Visit (INDEPENDENT_AMBULATORY_CARE_PROVIDER_SITE_OTHER): Payer: Managed Care, Other (non HMO) | Admitting: Maternal Newborn

## 2018-05-25 ENCOUNTER — Encounter: Payer: Self-pay | Admitting: Maternal Newborn

## 2018-05-25 ENCOUNTER — Other Ambulatory Visit (HOSPITAL_COMMUNITY)
Admission: RE | Admit: 2018-05-25 | Discharge: 2018-05-25 | Disposition: A | Discharge status: Home / Self Care | Payer: Managed Care, Other (non HMO) | Source: Ambulatory Visit | Attending: Maternal Newborn | Admitting: Maternal Newborn

## 2018-05-25 VITALS — BP 122/80 | HR 70 | Ht 63.0 in | Wt 220.0 lb

## 2018-05-25 DIAGNOSIS — Z01419 Encounter for gynecological examination (general) (routine) without abnormal findings: Secondary | ICD-10-CM

## 2018-05-25 DIAGNOSIS — Z13220 Encounter for screening for lipoid disorders: Secondary | ICD-10-CM

## 2018-05-25 DIAGNOSIS — Z113 Encounter for screening for infections with a predominantly sexual mode of transmission: Secondary | ICD-10-CM | POA: Insufficient documentation

## 2018-05-25 DIAGNOSIS — Z124 Encounter for screening for malignant neoplasm of cervix: Secondary | ICD-10-CM

## 2018-05-25 DIAGNOSIS — Z1329 Encounter for screening for other suspected endocrine disorder: Secondary | ICD-10-CM

## 2018-05-25 DIAGNOSIS — Z130 Encounter for screening for diseases of the blood and blood-forming organs and certain disorders involving the immune mechanism: Secondary | ICD-10-CM

## 2018-05-25 DIAGNOSIS — Z131 Encounter for screening for diabetes mellitus: Secondary | ICD-10-CM

## 2018-05-25 NOTE — Progress Notes (Signed)
Gynecology Annual Exam  PCP: Patient, No Pcp Per  Chief Complaint:  Chief Complaint  Patient presents with  . Gynecologic Exam    thick discharge, odor at times, no itchiness or irritation x yrs    History of Present Illness: Patient is a 27 y.o. G2P1011 presenting for an annual exam.   LMP: Patient's last menstrual period was 11/19/2017 (approximate). Average Interval: irregular Duration of flow: 5 days Heavy Menses: no Clots: no Intermenstrual Bleeding: no Postcoital Bleeding: no Dysmenorrhea: no  The patient is sexually active. She currently uses nothing for contraception. She denies dyspareunia.  The patient does perform self breast exams.  There is no notable family history of breast or ovarian cancer in her family.  The patient wears seatbelts: yes.     Domestic violence screening negative.  Review of Systems  Constitutional: Negative.   HENT: Negative.   Eyes: Negative.   Respiratory: Negative for cough, shortness of breath and wheezing.   Cardiovascular: Negative for chest pain and palpitations.  Gastrointestinal: Negative for abdominal pain, heartburn and nausea.  Genitourinary: Negative.        Vaginal discharge  Musculoskeletal: Negative.   Skin: Negative.   Neurological: Negative.   Endo/Heme/Allergies: Negative.   Psychiatric/Behavioral: Positive for depression. The patient is nervous/anxious.   All other systems reviewed and are negative.   Past Medical History:  Past Medical History:  Diagnosis Date  . Anemia   . Bacterial vaginosis 11/11/2015   History of  . Chlamydia   . Chronic kidney disease    h/o kidney stones  . Hyperlipidemia   . Irregular menses   . Oligomenorrhea   . PCOS (polycystic ovarian syndrome)   . PCOS (polycystic ovarian syndrome)   . Vaccine for human papilloma virus (HPV) types 6, 11, 16, and 18 administered   . Vitamin D deficiency   . Vitamin D deficiency     Past Surgical History:  Past Surgical History:    Procedure Laterality Date  . CESAREAN SECTION    . HYSTEROSCOPY W/D&C N/A 10/20/2015   Procedure: DILATATION AND CURETTAGE /HYSTEROSCOPY;  Surgeon: Malachy Mood, MD;  Location: [facility];  Service: Gynecology;  Laterality: N/A;    Gynecologic History:  Patient's last menstrual period was 11/19/2017 (approximate). Contraception: none Last Pap: 04/15/2013 Results were: NILM   Obstetric History: G2P1011  Family History:  Family History  Problem Relation Age of Onset  . Diabetes Mother        type II  . Hyperlipidemia Mother   . Hypertension Mother   . GER disease Mother   . Sleep apnea Mother   . Hypertension Father   . Hyperlipidemia Maternal Grandmother   . Diabetes Maternal Grandmother        Type II  . Cancer Maternal Grandfather        Lung cancer  . Hypertension Paternal Grandmother   . Cancer Paternal Grandfather   . Diabetes Paternal Grandfather   . Hyperlipidemia Paternal Grandfather   . Cataracts Paternal Grandfather     Social History:  Social History   Socioeconomic History  . Marital status: Single    Spouse name: Not on file  . Number of children: Not on file  . Years of education: Not on file  . Highest education level: Not on file  Occupational History  . Not on file  Social Needs  . Financial resource strain: Not on file  . Food insecurity:    Worry: Not on file    Inability:  Not on file  . Transportation needs:    Medical: Not on file    Non-medical: Not on file  Tobacco Use  . Smoking status: Current Every Day Smoker    Packs/day: 0.25    Years: 8.00    Pack years: 2.00    Types: Cigarettes  . Smokeless tobacco: Never Used  Substance and Sexual Activity  . Alcohol use: No  . Drug use: No  . Sexual activity: Yes    Birth control/protection: None  Lifestyle  . Physical activity:    Days per week: Not on file    Minutes per session: Not on file  . Stress: Not on file  Relationships  . Social connections:    Talks on phone: Not on  file    Gets together: Not on file    Attends religious service: Not on file    Active member of club or organization: Not on file    Attends meetings of clubs or organizations: Not on file    Relationship status: Not on file  . Intimate partner violence:    Fear of current or ex partner: Not on file    Emotionally abused: Not on file    Physically abused: Not on file    Forced sexual activity: Not on file  Other Topics Concern  . Not on file  Social History Narrative  . Not on file    Allergies:  Allergies  Allergen Reactions  . Augmentin [Amoxicillin-Pot Clavulanate] Nausea And Vomiting    Has patient had a PCN reaction causing immediate rash, facial/tongue/throat swelling, SOB or lightheadedness with hypotension: no Has patient had a PCN reaction causing severe rash involving mucus membranes or skin necrosis: unknown Has patient had a PCN reaction that required hospitalization no Has patient had a PCN reaction occurring within the last 10 years: yes If all of the above answers are "NO", then may proceed with Cephalosporin use.     Medications: Prior to Admission medications   Medication Sig Start Date End Date Taking? Authorizing Provider  hydrocortisone 2.5 % cream Apply topically to face twice daily as needed for rash. 05/09/18  Yes [provider]  triamcinolone ointment (KENALOG) 0.1 % Apply topically for eczema twice daily as needed. 05/09/18  Yes [provider]    Physical Exam Vitals: Blood pressure 122/80, pulse 70, height 5\' 3"  (1.6 m), weight 220 lb (99.8 kg), last menstrual period 11/19/2017.  General: NAD HEENT: normocephalic, anicteric Pulmonary: No increased work of breathing, CTAB Cardiovascular: RRR, no murmurs, rubs, or gallops Breast: Breasts symmetrical, no tenderness, no palpable nodules or masses, no skin or nipple retraction present, no nipple discharge.  No axillary or supraclavicular lymphadenopathy. Abdomen: Soft, non-tender,  non-distended.  Umbilicus without lesions.  No hepatomegaly, splenomegaly or masses palpable. No evidence of hernia  Genitourinary:  External: Normal external female genitalia.  Normal urethral  meatus, normal Bartholin's and Skene's glands.    Vagina: Normal vaginal mucosa, no evidence of prolapse.    Cervix: Grossly normal in appearance, no bleeding  Uterus: Non-enlarged, mobile, normal contour.  No CMT  Adnexa: ovaries non-enlarged, no adnexal masses  Rectal: deferred  Lymphatic: no evidence of inguinal lymphadenopathy Extremities: no edema, erythema, or tenderness Neurologic: Grossly intact Psychiatric: mood appropriate, affect full  Assessment: 27 y.o. G2P1011 annual exam.  Plan: Problem List Items Addressed This Visit    None    Visit Diagnoses    Women's annual routine gynecological examination    -  Primary  Relevant Orders   CBC With Differential   Comprehensive metabolic panel   Lipid panel   TSH   Hemoglobin A1c   HEP, RPR, HIV Panel   Cytology - PAP   Screen for STD (sexually transmitted disease)       Relevant Orders   HEP, RPR, HIV Panel   Cytology - PAP   Pap smear for cervical cancer screening       Relevant Orders   Cytology - PAP   Screening for diabetes mellitus       Relevant Orders   Hemoglobin A1c   Screening for cholesterol level       Relevant Orders   Lipid panel   Screening for iron deficiency anemia       Relevant Orders   CBC With Differential   Thyroid disorder screen       Relevant Orders   TSH      1) STI screening was offered and accepted.  2) ASCCP guidelines and rationale discussed.  Patient opts for every 3 year screening interval.  3) Contraception - None. She had been seen by Dr. Georgianne Fick last year for infertility, may return to MD or see infertility specialist if desired.   4) Routine healthcare maintenance including cholesterol, diabetes screening ordered; she will return fasting at a later date or have them done at [facility].  5) Brief discussion of anxiety and depression. She has some symptoms, but feels like she is usually coping with them well. Resources given for counseling.  6) Follow up 1 year for routine annual exam.  Avel Sensor, CNM 05/25/2018  9:46 AM

## 2018-05-29 LAB — CYTOLOGY - PAP
ADEQUACY: ABSENT
CHLAMYDIA, DNA PROBE: NEGATIVE
DIAGNOSIS: NEGATIVE
NEISSERIA GONORRHEA: NEGATIVE
Trichomonas: NEGATIVE

## 2018-05-30 ENCOUNTER — Other Ambulatory Visit: Payer: Self-pay | Admitting: Maternal Newborn

## 2018-05-30 DIAGNOSIS — B3731 Acute candidiasis of vulva and vagina: Secondary | ICD-10-CM

## 2018-05-30 DIAGNOSIS — B373 Candidiasis of vulva and vagina: Secondary | ICD-10-CM

## 2018-05-30 MED ORDER — FLUCONAZOLE 150 MG PO TABS: 150.0000 mg | ORAL_TABLET | Freq: Once | ORAL | 0 refills | Status: AC

## 2018-05-30 NOTE — Progress Notes (Signed)
Rx sent for Diflucan for positive candida results; she is aware to pick up medication from the pharmacy.

## 2018-07-06 ENCOUNTER — Telehealth: Payer: Self-pay

## 2018-07-06 ENCOUNTER — Ambulatory Visit: Payer: Managed Care, Other (non HMO) | Admitting: Obstetrics and Gynecology

## 2018-07-06 NOTE — Telephone Encounter (Signed)
Pt states she has on a depend and has bled thru it.  Adv our policy is the saturating pad q53min-1hr need to go to ED.  Pt not sure it's that heavy.  Adv needs to be seen.  appt today at 4pm c AMS.

## 2018-07-06 NOTE — Telephone Encounter (Signed)
Pt had D&C 2-18yrs ago; has PCOS; usually doesn't have a period every month; hasn't had a period since May.  It's really really really heavy.  Was under the impression she wouldn't bleed heavy p having D&C.  [phone]  [facility]

## 2018-08-05 ENCOUNTER — Emergency Department
Admission: EM | Admit: 2018-08-05 | Discharge: 2018-08-05 | Disposition: A | Discharge status: Home / Self Care | Payer: Managed Care, Other (non HMO) | Attending: Emergency Medicine | Admitting: Emergency Medicine

## 2018-08-05 ENCOUNTER — Emergency Department: Payer: Managed Care, Other (non HMO)

## 2018-08-05 ENCOUNTER — Other Ambulatory Visit: Payer: Self-pay

## 2018-08-05 DIAGNOSIS — F17210 Nicotine dependence, cigarettes, uncomplicated: Secondary | ICD-10-CM | POA: Insufficient documentation

## 2018-08-05 DIAGNOSIS — R52 Pain, unspecified: Secondary | ICD-10-CM

## 2018-08-05 DIAGNOSIS — Z79899 Other long term (current) drug therapy: Secondary | ICD-10-CM | POA: Insufficient documentation

## 2018-08-05 DIAGNOSIS — E282 Polycystic ovarian syndrome: Secondary | ICD-10-CM | POA: Insufficient documentation

## 2018-08-05 DIAGNOSIS — B3731 Acute candidiasis of vulva and vagina: Secondary | ICD-10-CM

## 2018-08-05 DIAGNOSIS — B373 Candidiasis of vulva and vagina: Secondary | ICD-10-CM | POA: Diagnosis not present

## 2018-08-05 DIAGNOSIS — R102 Pelvic and perineal pain: Secondary | ICD-10-CM | POA: Diagnosis not present

## 2018-08-05 DIAGNOSIS — B9689 Other specified bacterial agents as the cause of diseases classified elsewhere: Secondary | ICD-10-CM

## 2018-08-05 DIAGNOSIS — N760 Acute vaginitis: Secondary | ICD-10-CM | POA: Insufficient documentation

## 2018-08-05 LAB — BASIC METABOLIC PANEL
Anion gap: 7 (ref 5–15)
BUN: 14 mg/dL (ref 6–20)
CO2: 22 mmol/L (ref 22–32)
Calcium: 8.5 mg/dL — ABNORMAL LOW (ref 8.9–10.3)
Chloride: 107 mmol/L (ref 98–111)
Creatinine, Ser: 0.78 mg/dL (ref 0.44–1.00)
GFR calc Af Amer: >60 mL/min (ref >60)
GLUCOSE: 106 mg/dL — AB (ref 70–99)
POTASSIUM: 4 mmol/L (ref 3.5–5.1)
Sodium: 136 mmol/L (ref 135–145)

## 2018-08-05 LAB — CBC WITH DIFFERENTIAL/PLATELET
ABS IMMATURE GRANULOCYTES: 0.03 10*3/uL (ref 0.00–0.07)
BASOS PCT: 0 %
Basophils Absolute: 0 10*3/uL (ref 0.0–0.1)
Eosinophils Absolute: 0.3 10*3/uL (ref 0.0–0.5)
Eosinophils Relative: 4 %
HEMATOCRIT: 40.1 % (ref 36.0–46.0)
Hemoglobin: 13.2 g/dL (ref 12.0–15.0)
IMMATURE GRANULOCYTES: 0 %
LYMPHS ABS: 3 10*3/uL (ref 0.7–4.0)
LYMPHS PCT: 32 %
MCH: 29.1 pg (ref 26.0–34.0)
MCHC: 32.9 g/dL (ref 30.0–36.0)
MCV: 88.3 fL (ref 80.0–100.0)
MONOS PCT: 5 %
Monocytes Absolute: 0.5 10*3/uL (ref 0.1–1.0)
NEUTROS ABS: 5.6 10*3/uL (ref 1.7–7.7)
NEUTROS PCT: 59 %
PLATELETS: 316 10*3/uL (ref 150–400)
RBC: 4.54 MIL/uL (ref 3.87–5.11)
RDW: 12.6 % (ref 11.5–15.5)
WBC: 9.5 10*3/uL (ref 4.0–10.5)
nRBC: 0 % (ref 0.0–0.2)

## 2018-08-05 LAB — URINALYSIS, COMPLETE (UACMP) WITH MICROSCOPIC
BILIRUBIN URINE: NEGATIVE
Bacteria, UA: NONE SEEN
Glucose, UA: NEGATIVE mg/dL
Ketones, ur: NEGATIVE mg/dL
Leukocytes, UA: NEGATIVE
Nitrite: NEGATIVE
Protein, ur: NEGATIVE mg/dL
Specific Gravity, Urine: 1.024 (ref 1.005–1.030)
pH: 5 (ref 5.0–8.0)

## 2018-08-05 LAB — PREGNANCY, URINE: Preg Test, Ur: NEGATIVE

## 2018-08-05 LAB — CHLAMYDIA/NGC RT PCR (ARMC ONLY)
CHLAMYDIA TR: NOT DETECTED
N gonorrhoeae: NOT DETECTED

## 2018-08-05 LAB — WET PREP, GENITAL
Sperm: NONE SEEN
Trich, Wet Prep: NONE SEEN
WBC, Wet Prep HPF POC: NONE SEEN

## 2018-08-05 MED ORDER — FLUCONAZOLE 50 MG PO TABS
150.0000 mg | ORAL_TABLET | Freq: Once | ORAL | Status: AC
  Administered 2018-08-05: 150 mg via ORAL
  Filled 2018-08-05: qty 1

## 2018-08-05 MED ORDER — METRONIDAZOLE 500 MG PO TABS: 500.0000 mg | ORAL_TABLET | Freq: Two times a day (BID) | ORAL | 0 refills | Status: DC

## 2018-08-05 MED ORDER — METRONIDAZOLE 500 MG PO TABS
500.0000 mg | ORAL_TABLET | Freq: Once | ORAL | Status: AC
  Administered 2018-08-05: 500 mg via ORAL
  Filled 2018-08-05: qty 1

## 2018-08-05 NOTE — Discharge Instructions (Addendum)
Take antibiotic as prescribed (Flagyl 500mg  twice daily x 7 days). Return to the ER for worsening symptoms, persistent vomiting, difficulty breathing or other concerns.

## 2018-08-05 NOTE — ED Provider Notes (Signed)
[facility] Emergency Department Provider Note   ____________________________________________   First MD Initiated Contact with Patient 08/05/18 [phone]     (approximate)  I have reviewed the triage vital signs and the nursing notes.   HISTORY  Chief Complaint Abdominal Pain    HPI Susan Hubbard is a 27 y.o. female who presents to the ED from home with a chief complaint of pelvic pain.  Patient reports a 1 day history of sharp, bilateral pelvic pain.  History of PCOS.  Denies associated fever, chills, chest pain, shortness of breath, nausea, vomiting, dysuria.  States she has a natural vaginal discharge which is not worse or different.  Does have unprotected sex so may have been exposed to STDs.  Last sexual intercourse yesterday.  Denies recent travel or trauma.   Past Medical History:  Diagnosis Date  . Anemia   . Bacterial vaginosis 11/11/2015   History of  . Chlamydia   . Chronic kidney disease    h/o kidney stones  . Hyperlipidemia   . Irregular menses   . Oligomenorrhea   . PCOS (polycystic ovarian syndrome)   . PCOS (polycystic ovarian syndrome)   . Vaccine for human papilloma virus (HPV) types 6, 11, 16, and 18 administered   . Vitamin D deficiency   . Vitamin D deficiency     Patient Active Problem List   Diagnosis Date Noted  . Right hand pain 09/20/2016  . Bacterial vaginosis 11/11/2015  . Placental site nodule 11/11/2015  . Abortion 05/18/2015  . Vitamin D deficiency 02/05/2015  . Obesity 02/05/2015  . Pure hypercholesterolemia 02/05/2015  . Kidney stones 02/05/2015  . Sleep disturbance 02/05/2015  . Acne 02/05/2015  . Irregular menses 02/05/2015  . Tobacco abuse 02/05/2015  . Eczema 02/05/2015  . History of PCOS 02/05/2015    Past Surgical History:  Procedure Laterality Date  . CESAREAN SECTION    . HYSTEROSCOPY W/D&C N/A 10/20/2015   Procedure: DILATATION AND CURETTAGE /HYSTEROSCOPY;  Surgeon: Malachy Mood, MD;   Location: [facility] ORS;  Service: Gynecology;  Laterality: N/A;    Prior to Admission medications   Medication Sig Start Date End Date Taking? Authorizing Provider  hydrocortisone 2.5 % cream Apply topically to face twice daily as needed for rash. 05/09/18   [provider]  metroNIDAZOLE (FLAGYL) 500 MG tablet Take 1 tablet (500 mg total) by mouth 2 (two) times daily. 08/05/18   Paulette Blanch, MD  triamcinolone ointment (KENALOG) 0.1 % Apply topically for eczema twice daily as needed. 05/09/18   [provider]    Allergies Augmentin [amoxicillin-pot clavulanate]  Family History  Problem Relation Age of Onset  . Diabetes Mother        type II  . Hyperlipidemia Mother   . Hypertension Mother   . GER disease Mother   . Sleep apnea Mother   . Hypertension Father   . Hyperlipidemia Maternal Grandmother   . Diabetes Maternal Grandmother        Type II  . Cancer Maternal Grandfather        Lung cancer  . Hypertension Paternal Grandmother   . Cancer Paternal Grandfather   . Diabetes Paternal Grandfather   . Hyperlipidemia Paternal Grandfather   . Cataracts Paternal Grandfather     Social History Social History   Tobacco Use  . Smoking status: Current Every Day Smoker    Packs/day: 0.25    Years: 8.00    Pack years: 2.00    Types:  Cigarettes  . Smokeless tobacco: Never Used  Substance Use Topics  . Alcohol use: No  . Drug use: No    Review of Systems  Constitutional: No fever/chills Eyes: No visual changes. ENT: No sore throat. Cardiovascular: Denies chest pain. Respiratory: Denies shortness of breath. Gastrointestinal: Positive for pelvic pain.  No abdominal pain.  No nausea, no vomiting.  No diarrhea.  No constipation. Genitourinary: Negative for dysuria. Musculoskeletal: Negative for back pain. Skin: Negative for rash. Neurological: Negative for headaches, focal weakness or numbness.   ____________________________________________   PHYSICAL  EXAM:  VITAL SIGNS: ED Triage Vitals  Enc Vitals Group     BP 08/05/18 0150 138/78     Pulse Rate 08/05/18 0150 81     Resp 08/05/18 0150 18     Temp 08/05/18 0150 98.7 F (37.1 C)     Temp Source 08/05/18 0150 Oral     SpO2 08/05/18 0150 100 %     Weight 08/05/18 0146 212 lb (96.2 kg)     Height 08/05/18 0146 5\' 3"  (1.6 m)     Head Circumference --      Peak Flow --      Pain Score 08/05/18 0146 8     Pain Loc --      Pain Edu? --      Excl. in [place]? --     Constitutional: Alert and oriented. Well appearing and in no acute distress. Eyes: Conjunctivae are normal. PERRL. EOMI. Head: Atraumatic. Nose: No congestion/rhinnorhea. Mouth/Throat: Mucous membranes are moist.  Oropharynx non-erythematous. Neck: No stridor.   Cardiovascular: Normal rate, regular rhythm. Grossly normal heart sounds.  Good peripheral circulation. Respiratory: Normal respiratory effort.  No retractions. Lungs CTAB. Gastrointestinal: Soft and nontender to light or deep palpation. No distention. No abdominal bruits. No CVA tenderness. Musculoskeletal: No lower extremity tenderness nor edema.  No joint effusions. Neurologic:  Normal speech and language. No gross focal neurologic deficits are appreciated. No gait instability. Skin:  Skin is warm, dry and intact. No rash noted. Psychiatric: Mood and affect are normal. Speech and behavior are normal.  ____________________________________________   LABS (all labs ordered are listed, but only abnormal results are displayed)  Labs Reviewed  WET PREP, GENITAL - Abnormal; Notable for the following components:      Result Value   Yeast Wet Prep HPF POC PRESENT (*)    Clue Cells Wet Prep HPF POC PRESENT (*)    All other components within normal limits  BASIC METABOLIC PANEL - Abnormal; Notable for the following components:   Glucose, Bld 106 (*)    Calcium 8.5 (*)    All other components within normal limits  URINALYSIS, COMPLETE (UACMP) WITH MICROSCOPIC -  Abnormal; Notable for the following components:   Color, Urine YELLOW (*)    APPearance HAZY (*)    Hgb urine dipstick MODERATE (*)    All other components within normal limits  CHLAMYDIA/NGC RT PCR ([facility] ONLY)  CBC WITH DIFFERENTIAL/PLATELET  PREGNANCY, URINE  POC URINE PREG, ED   ____________________________________________  EKG  None ____________________________________________  RADIOLOGY  ED MD interpretation: Unremarkable pelvic ultrasound  Official radiology report(s): US Pelvis Transvanginal Non-ob (tv Only)  Result Date: 08/05/2018 CLINICAL DATA:  28 year old female presents with pelvic pain. History of PCOS. LMP 07/05/2018. EXAM: TRANSABDOMINAL AND TRANSVAGINAL ULTRASOUND OF PELVIS DOPPLER ULTRASOUND OF OVARIES TECHNIQUE: Both transabdominal and transvaginal ultrasound examinations of the pelvis were performed. Transabdominal technique was performed for global imaging of the pelvis including uterus, ovaries, adnexal  regions, and pelvic cul-de-sac. It was necessary to proceed with endovaginal exam following the transabdominal exam to visualize the endometrium and adnexa. Color and duplex Doppler ultrasound was utilized to evaluate blood flow to the ovaries. COMPARISON:  12/13/2016 pelvic sonogram. FINDINGS: Uterus Measurements: 8.9 x 4.5 x 5.7 cm = volume: 120 mL. Anteverted uterus is mildly enlarged and otherwise normal in configuration, with no uterine fibroids or other myometrial abnormalities. Endometrium Thickness: 9 mm. No endometrial cavity fluid or focal endometrial mass. Right ovary Measurements: 4.1 x 2.3 x 2.7 cm = volume: 13.4 mL. Normal appearance/no adnexal mass. Left ovary Measurements: 3.5 x 2.3 x 2.9 cm = volume: 12.2 mL. Normal appearance/no adnexal mass. Pulsed Doppler evaluation of both ovaries demonstrates normal low-resistance arterial and venous waveforms. Other findings No abnormal free fluid. IMPRESSION: Normal pelvic ultrasound. Normal ovaries with  no evidence of adnexal torsion. Electronically Signed   By: Ilona Sorrel M.D.   On: 08/05/2018 04:29   US Pelvis Complete  Result Date: 08/05/2018 CLINICAL DATA:  28 year old female presents with pelvic pain. History of PCOS. LMP 07/05/2018. EXAM: TRANSABDOMINAL AND TRANSVAGINAL ULTRASOUND OF PELVIS DOPPLER ULTRASOUND OF OVARIES TECHNIQUE: Both transabdominal and transvaginal ultrasound examinations of the pelvis were performed. Transabdominal technique was performed for global imaging of the pelvis including uterus, ovaries, adnexal regions, and pelvic cul-de-sac. It was necessary to proceed with endovaginal exam following the transabdominal exam to visualize the endometrium and adnexa. Color and duplex Doppler ultrasound was utilized to evaluate blood flow to the ovaries. COMPARISON:  12/13/2016 pelvic sonogram. FINDINGS: Uterus Measurements: 8.9 x 4.5 x 5.7 cm = volume: 120 mL. Anteverted uterus is mildly enlarged and otherwise normal in configuration, with no uterine fibroids or other myometrial abnormalities. Endometrium Thickness: 9 mm. No endometrial cavity fluid or focal endometrial mass. Right ovary Measurements: 4.1 x 2.3 x 2.7 cm = volume: 13.4 mL. Normal appearance/no adnexal mass. Left ovary Measurements: 3.5 x 2.3 x 2.9 cm = volume: 12.2 mL. Normal appearance/no adnexal mass. Pulsed Doppler evaluation of both ovaries demonstrates normal low-resistance arterial and venous waveforms. Other findings No abnormal free fluid. IMPRESSION: Normal pelvic ultrasound. Normal ovaries with no evidence of adnexal torsion. Electronically Signed   By: Ilona Sorrel M.D.   On: 08/05/2018 04:29   US Pelvic Doppler (torsion R/o Or Mass Arterial Flow)  Result Date: 08/05/2018 CLINICAL DATA:  28 year old female presents with pelvic pain. History of PCOS. LMP 07/05/2018. EXAM: TRANSABDOMINAL AND TRANSVAGINAL ULTRASOUND OF PELVIS DOPPLER ULTRASOUND OF OVARIES TECHNIQUE: Both transabdominal and transvaginal ultrasound  examinations of the pelvis were performed. Transabdominal technique was performed for global imaging of the pelvis including uterus, ovaries, adnexal regions, and pelvic cul-de-sac. It was necessary to proceed with endovaginal exam following the transabdominal exam to visualize the endometrium and adnexa. Color and duplex Doppler ultrasound was utilized to evaluate blood flow to the ovaries. COMPARISON:  12/13/2016 pelvic sonogram. FINDINGS: Uterus Measurements: 8.9 x 4.5 x 5.7 cm = volume: 120 mL. Anteverted uterus is mildly enlarged and otherwise normal in configuration, with no uterine fibroids or other myometrial abnormalities. Endometrium Thickness: 9 mm. No endometrial cavity fluid or focal endometrial mass. Right ovary Measurements: 4.1 x 2.3 x 2.7 cm = volume: 13.4 mL. Normal appearance/no adnexal mass. Left ovary Measurements: 3.5 x 2.3 x 2.9 cm = volume: 12.2 mL. Normal appearance/no adnexal mass. Pulsed Doppler evaluation of both ovaries demonstrates normal low-resistance arterial and venous waveforms. Other findings No abnormal free fluid. IMPRESSION: Normal pelvic ultrasound. Normal ovaries with no evidence  of adnexal torsion. Electronically Signed   By: Ilona Sorrel M.D.   On: 08/05/2018 04:29    ____________________________________________   PROCEDURES  Procedure(s) performed:   Pelvic exam: External exam within normal limits without rashes, lesions or vesicles.  Speculum exam reveals no vaginal bleeding.  There is a mild white discharge.  Cervical os is closed.  Bimanual exam unremarkable.  Procedures  Critical Care performed: No  ____________________________________________   INITIAL IMPRESSION / ASSESSMENT AND PLAN / ED COURSE  As part of my medical decision making, I reviewed the following data within the [place] notes reviewed and incorporated, Labs reviewed, Radiograph reviewed and Notes from prior ED visits   28 year old female with PCOS who  presents with pelvic pain. Differential diagnosis includes, but is not limited to, ovarian cyst, ovarian torsion, acute appendicitis, diverticulitis, urinary tract infection/pyelonephritis, endometriosis, bowel obstruction, colitis, renal colic, gastroenteritis, hernia, fibroids, endometriosis, pregnancy related pain including ectopic pregnancy, etc.  We will check basic lab work and send for pelvic ultrasound.  Clinical Course as of Aug 05 536  Sun Aug 05, 2018  0455 Updated patient of all test results.  Will administer Diflucan here and discharged home on Flagyl.  Strict return precautions given.  Patient verbalizes understanding and agrees with plan of care.   [JS]    Clinical Course User Index [JS] Paulette Blanch, MD     ____________________________________________   FINAL CLINICAL IMPRESSION(S) / ED DIAGNOSES  Final diagnoses:  Pain  Pelvic pain in female  Yeast vaginitis  Bacterial vaginosis     ED Discharge Orders         Ordered    metroNIDAZOLE (FLAGYL) 500 MG tablet  2 times daily     08/05/18 0457           Note:  This document was prepared using Dragon voice recognition software and may include unintentional dictation errors.    Paulette Blanch, MD 08/05/18 [phone]

## 2018-08-05 NOTE — ED Triage Notes (Signed)
Patient reports lower abdominal pain.

## 2018-08-28 ENCOUNTER — Encounter: Payer: Self-pay | Admitting: Podiatry

## 2018-08-28 ENCOUNTER — Ambulatory Visit: Payer: Managed Care, Other (non HMO) | Admitting: Podiatry

## 2018-08-28 ENCOUNTER — Ambulatory Visit: Payer: Managed Care, Other (non HMO)

## 2018-08-28 DIAGNOSIS — S93601A Unspecified sprain of right foot, initial encounter: Secondary | ICD-10-CM

## 2018-08-28 DIAGNOSIS — M79671 Pain in right foot: Secondary | ICD-10-CM

## 2018-08-28 MED ORDER — MELOXICAM 15 MG PO TABS: 15.0000 mg | ORAL_TABLET | Freq: Every day | ORAL | 1 refills | Status: AC

## 2018-08-28 MED ORDER — METHYLPREDNISOLONE 4 MG PO TBPK: ORAL_TABLET | ORAL | 0 refills | Status: DC

## 2018-09-02 NOTE — Progress Notes (Signed)
   HPI: 28 year old female presenting today with a chief complaint of right foot and ankle pain that began 5 days ago after a twisting injury. She reports associated swelling and notes the pain medial aspects of the foot and ankle. She was seen at the urgent care and was given a CAM boot. Walking and moving the foot and ankle increases the pain. Patient is here for further evaluation and treatment.   Past Medical History:  Diagnosis Date  . Anemia   . Bacterial vaginosis 11/11/2015   History of  . Chlamydia   . Chronic kidney disease    h/o kidney stones  . Hyperlipidemia   . Irregular menses   . Oligomenorrhea   . PCOS (polycystic ovarian syndrome)   . PCOS (polycystic ovarian syndrome)   . Vaccine for human papilloma virus (HPV) types 6, 11, 16, and 18 administered   . Vitamin D deficiency   . Vitamin D deficiency      Physical Exam: General: The patient is alert and oriented x3 in no acute distress.  Dermatology: Skin is warm, dry and supple bilateral lower extremities. Negative for open lesions or macerations.  Vascular: Palpable pedal pulses bilaterally. No edema or erythema noted. Capillary refill within normal limits.  Neurological: Epicritic and protective threshold grossly intact bilaterally.   Musculoskeletal Exam: Pain with palpation noted to the right medial ankle and posterior tibial tendon. Range of motion within normal limits to all pedal and ankle joints bilateral. Muscle strength 5/5 in all groups bilateral.   Assessment: 1. Acute right foot sprain    Plan of Care:  1. Patient evaluated. X-Rays declined secondary to pregnancy.   2. Prescription for Medrol Dose Pak provided to patient. 3. Prescription for Meloxicam provided to patient. 4. Ankle brace dispensed.  5. Recommended good shoe gear.  6. Return to clinic as needed.     Edrick Kins, DPM [facility]  Dr. Edrick Kins, DPM    [address], [place] [address]                Office [phone]  Fax [phone]

## 2019-04-19 ENCOUNTER — Ambulatory Visit: Payer: Managed Care, Other (non HMO) | Admitting: Family Medicine

## 2019-05-01 ENCOUNTER — Other Ambulatory Visit: Payer: Self-pay

## 2019-05-01 ENCOUNTER — Ambulatory Visit (INDEPENDENT_AMBULATORY_CARE_PROVIDER_SITE_OTHER): Payer: Managed Care, Other (non HMO) | Admitting: Maternal Newborn

## 2019-05-01 ENCOUNTER — Encounter: Payer: Self-pay | Admitting: Maternal Newborn

## 2019-05-01 VITALS — BP 122/74 | Ht 63.0 in | Wt 216.0 lb

## 2019-05-01 DIAGNOSIS — O200 Threatened abortion: Secondary | ICD-10-CM

## 2019-05-01 NOTE — Progress Notes (Signed)
Obstetrics & Gynecology Office Visit   Chief Complaint:  Chief Complaint  Patient presents with  . Vaginal Bleeding    pt had positive UPT last Monday, then had a lot of heavy bleeding this week    History of Present Illness: Susan Hubbard took a home pregnancy test last Monday and got a positive result. This week she was packing/moving when she began to have heavy bleeding with clots. This continued for one day. She still has some blood present now when wiping after going to the bathroom, but no more flow. She did not have any cramping with the bleeding. She has uncertainty about her LMP due to irregular cycles, but thinks that she may not have had a cycle since July.   Review of Systems: Review of systems negative unless otherwise noted in HPI  Past Medical History:  Past Medical History:  Diagnosis Date  . Anemia   . Bacterial vaginosis 11/11/2015   History of  . Chlamydia   . Chronic kidney disease    h/o kidney stones  . Hyperlipidemia   . Irregular menses   . Oligomenorrhea   . PCOS (polycystic ovarian syndrome)   . PCOS (polycystic ovarian syndrome)   . Vaccine for human papilloma virus (HPV) types 6, 11, 16, and 18 administered   . Vitamin D deficiency   . Vitamin D deficiency     Past Surgical History:  Past Surgical History:  Procedure Laterality Date  . CESAREAN SECTION    . HYSTEROSCOPY W/D&C N/A 10/20/2015   Procedure: DILATATION AND CURETTAGE /HYSTEROSCOPY;  Surgeon: Malachy Mood, MD;  Location: [facility] ORS;  Service: Gynecology;  Laterality: N/A;    Gynecologic History: No LMP recorded.  Obstetric History: G2P1011  Family History:  Family History  Problem Relation Age of Onset  . Diabetes Mother        type II  . Hyperlipidemia Mother   . Hypertension Mother   . GER disease Mother   . Sleep apnea Mother   . Hypertension Father   . Hyperlipidemia Maternal Grandmother   . Diabetes Maternal Grandmother        Type II  . Cancer Maternal Grandfather     Lung cancer  . Hypertension Paternal Grandmother   . Cancer Paternal Grandfather   . Diabetes Paternal Grandfather   . Hyperlipidemia Paternal Grandfather   . Cataracts Paternal Grandfather     Social History:  Social History   Socioeconomic History  . Marital status: Single    Spouse name: Not on file  . Number of children: Not on file  . Years of education: Not on file  . Highest education level: Not on file  Occupational History  . Not on file  Social Needs  . Financial resource strain: Not on file  . Food insecurity    Worry: Not on file    Inability: Not on file  . Transportation needs    Medical: Not on file    Non-medical: Not on file  Tobacco Use  . Smoking status: Current Every Day Smoker    Packs/day: 0.25    Years: 8.00    Pack years: 2.00    Types: Cigarettes  . Smokeless tobacco: Never Used  Substance and Sexual Activity  . Alcohol use: No  . Drug use: No  . Sexual activity: Yes    Birth control/protection: None  Lifestyle  . Physical activity    Days per week: Not on file    Minutes per session: Not on  file  . Stress: Not on file  Relationships  . Social Herbalist on phone: Not on file    Gets together: Not on file    Attends religious service: Not on file    Active member of club or organization: Not on file    Attends meetings of clubs or organizations: Not on file    Relationship status: Not on file  . Intimate partner violence    Fear of current or ex partner: Not on file    Emotionally abused: Not on file    Physically abused: Not on file    Forced sexual activity: Not on file  Other Topics Concern  . Not on file  Social History Narrative  . Not on file    Allergies:  Allergies  Allergen Reactions  . Augmentin [Amoxicillin-Pot Clavulanate] Nausea And Vomiting    Has patient had a PCN reaction causing immediate rash, facial/tongue/throat swelling, SOB or lightheadedness with hypotension: no Has patient had a PCN  reaction causing severe rash involving mucus membranes or skin necrosis: unknown Has patient had a PCN reaction that required hospitalization no Has patient had a PCN reaction occurring within the last 10 years: yes If all of the above answers are "NO", then may proceed with Cephalosporin use.     Medications: Prior to Admission medications   Medication Sig Start Date End Date Taking? Authorizing Provider  methylPREDNISolone (MEDROL DOSEPAK) 4 MG TBPK tablet 6 day dose pack - take as directed Patient not taking: Reported on 05/01/2019 08/28/18   Edrick Kins, DPM    Physical Exam Vitals:  Vitals:   05/01/19 1359  BP: 122/74   No LMP recorded.  General: NAD HEENT: normocephalic, anicteric Pulmonary: No increased work of breathing Neurologic: Grossly intact Psychiatric: mood appropriate, affect full  Assessment: 27 y.o. G2P1011 with positive pregnancy test and an episode of heavy vaginal bleeding.  Plan: Problem List Items Addressed This Visit    None    Visit Diagnoses    Threatened miscarriage in early pregnancy    -  Primary   Relevant Orders   Beta HCG, Quant   Beta HCG, Quant     UPT was positive today. Ordered serial beta hCG for today and Friday to try to determine whether pregnancy is viable.   Avel Sensor, CNM 05/01/2019  4:18 PM

## 2019-05-02 LAB — BETA HCG QUANT (REF LAB): hCG Quant: 9947 m[IU]/mL

## 2019-05-03 ENCOUNTER — Other Ambulatory Visit: Payer: Managed Care, Other (non HMO)

## 2019-05-03 ENCOUNTER — Other Ambulatory Visit: Payer: Self-pay

## 2019-05-03 DIAGNOSIS — O200 Threatened abortion: Secondary | ICD-10-CM

## 2019-05-04 LAB — BETA HCG QUANT (REF LAB): hCG Quant: 8413 m[IU]/mL

## 2019-05-06 ENCOUNTER — Other Ambulatory Visit: Payer: Self-pay | Admitting: Maternal Newborn

## 2019-05-06 ENCOUNTER — Telehealth: Payer: Self-pay

## 2019-05-06 DIAGNOSIS — O3680X0 Pregnancy with inconclusive fetal viability, not applicable or unspecified: Secondary | ICD-10-CM

## 2019-05-06 NOTE — Telephone Encounter (Signed)
They are dropping, Jaci ordered an ultrasound today so I'm guess she has touched base with the patient

## 2019-05-06 NOTE — Progress Notes (Signed)
Ultrasound ordered; to be seen for vaginal bleeding with early pregnancy.

## 2019-05-06 NOTE — Telephone Encounter (Signed)
Patient inquiring about 05/03/2019 HCG lab results. [id number]

## 2019-05-07 ENCOUNTER — Telehealth: Payer: Self-pay | Admitting: Maternal Newborn

## 2019-05-07 NOTE — Telephone Encounter (Signed)
Patient is calling to schedule ultrasound appointment. Is this to be schedule next available appointment? Please advise

## 2019-05-08 NOTE — Telephone Encounter (Signed)
I contacted patient and schedule for Thursday, 05/09/19 at 1 pm for ultrasound and schedule follow up for Friday, 05/10/19 at 8 am with JS. We had openings in the schedule is this ok?

## 2019-05-08 NOTE — Telephone Encounter (Signed)
That's fine, thank you  

## 2019-05-09 ENCOUNTER — Other Ambulatory Visit: Payer: Self-pay

## 2019-05-09 ENCOUNTER — Ambulatory Visit (INDEPENDENT_AMBULATORY_CARE_PROVIDER_SITE_OTHER): Payer: Managed Care, Other (non HMO)

## 2019-05-09 ENCOUNTER — Other Ambulatory Visit: Payer: Managed Care, Other (non HMO)

## 2019-05-09 DIAGNOSIS — Z3A11 11 weeks gestation of pregnancy: Secondary | ICD-10-CM | POA: Diagnosis not present

## 2019-05-09 DIAGNOSIS — O3481 Maternal care for other abnormalities of pelvic organs, first trimester: Secondary | ICD-10-CM

## 2019-05-09 DIAGNOSIS — O3680X0 Pregnancy with inconclusive fetal viability, not applicable or unspecified: Secondary | ICD-10-CM | POA: Diagnosis not present

## 2019-05-10 ENCOUNTER — Ambulatory Visit: Payer: Managed Care, Other (non HMO) | Admitting: Maternal Newborn

## 2019-05-14 ENCOUNTER — Ambulatory Visit (INDEPENDENT_AMBULATORY_CARE_PROVIDER_SITE_OTHER): Payer: Managed Care, Other (non HMO) | Admitting: Maternal Newborn

## 2019-05-14 ENCOUNTER — Other Ambulatory Visit: Payer: Self-pay

## 2019-05-14 ENCOUNTER — Encounter: Payer: Self-pay | Admitting: Maternal Newborn

## 2019-05-14 VITALS — BP 120/74 | HR 82 | Ht 63.0 in | Wt 217.0 lb

## 2019-05-14 DIAGNOSIS — O3680X0 Pregnancy with inconclusive fetal viability, not applicable or unspecified: Secondary | ICD-10-CM | POA: Diagnosis not present

## 2019-05-14 NOTE — Progress Notes (Signed)
Obstetrics & Gynecology Office Visit   Chief Complaint:  Chief Complaint  Patient presents with  . Follow-up    bleeding in early preg;     History of Present Illness: Susan Hubbard is following up today for an ultrasound for bleeding two weeks ago with falling beta hCG results. She is having occasional spotting when she wipes. She  has not had any further episodes of heavy bleeding.  Review of Systems: Review of systems negative unless otherwise noted in HPI.  Past Medical History:  Past Medical History:  Diagnosis Date  . Anemia   . Bacterial vaginosis 11/11/2015   History of  . Chlamydia   . Chronic kidney disease    h/o kidney stones  . Hyperlipidemia   . Irregular menses   . Oligomenorrhea   . PCOS (polycystic ovarian syndrome)   . PCOS (polycystic ovarian syndrome)   . Vaccine for human papilloma virus (HPV) types 6, 11, 16, and 18 administered   . Vitamin D deficiency   . Vitamin D deficiency     Past Surgical History:  Past Surgical History:  Procedure Laterality Date  . CESAREAN SECTION    . HYSTEROSCOPY W/D&C N/A 10/20/2015   Procedure: DILATATION AND CURETTAGE /HYSTEROSCOPY;  Surgeon: Malachy Mood, MD;  Location: [facility] ORS;  Service: Gynecology;  Laterality: N/A;    Gynecologic History: Patient's last menstrual period was 01/30/2019 (approximate).  Obstetric History: G2P1011  Family History:  Family History  Problem Relation Age of Onset  . Diabetes Mother        type II  . Hyperlipidemia Mother   . Hypertension Mother   . GER disease Mother   . Sleep apnea Mother   . Hypertension Father   . Hyperlipidemia Maternal Grandmother   . Diabetes Maternal Grandmother        Type II  . Cancer Maternal Grandfather        Lung cancer  . Hypertension Paternal Grandmother   . Cancer Paternal Grandfather   . Diabetes Paternal Grandfather   . Hyperlipidemia Paternal Grandfather   . Cataracts Paternal Grandfather     Social History:  Social History    Socioeconomic History  . Marital status: Single    Spouse name: Not on file  . Number of children: Not on file  . Years of education: Not on file  . Highest education level: Not on file  Occupational History  . Not on file  Social Needs  . Financial resource strain: Not on file  . Food insecurity    Worry: Not on file    Inability: Not on file  . Transportation needs    Medical: Not on file    Non-medical: Not on file  Tobacco Use  . Smoking status: Current Every Day Smoker    Packs/day: 0.25    Years: 8.00    Pack years: 2.00    Types: Cigarettes  . Smokeless tobacco: Never Used  . Tobacco comment: quit 2019  Substance and Sexual Activity  . Alcohol use: No  . Drug use: No  . Sexual activity: Yes    Birth control/protection: None  Lifestyle  . Physical activity    Days per week: Not on file    Minutes per session: Not on file  . Stress: Not on file  Relationships  . Social Herbalist on phone: Not on file    Gets together: Not on file    Attends religious service: Not on file  Active member of club or organization: Not on file    Attends meetings of clubs or organizations: Not on file    Relationship status: Not on file  . Intimate partner violence    Fear of current or ex partner: Not on file    Emotionally abused: Not on file    Physically abused: Not on file    Forced sexual activity: Not on file  Other Topics Concern  . Not on file  Social History Narrative  . Not on file    Allergies:  Allergies  Allergen Reactions  . Augmentin [Amoxicillin-Pot Clavulanate] Nausea And Vomiting    Has patient had a PCN reaction causing immediate rash, facial/tongue/throat swelling, SOB or lightheadedness with hypotension: no Has patient had a PCN reaction causing severe rash involving mucus membranes or skin necrosis: unknown Has patient had a PCN reaction that required hospitalization no Has patient had a PCN reaction occurring within the last 10 years:  yes If all of the above answers are "NO", then may proceed with Cephalosporin use.     Medications: Prior to Admission medications   Medication Sig Start Date End Date Taking? Authorizing Provider  hydrocortisone 2.5 % cream Apply 1 application topically 2 (two) times daily. 01/15/19  Yes [provider]  Multiple Vitamins-Minerals (MULTIVITAMIN WITH MINERALS) tablet Take 1 tablet by mouth daily.   Yes [provider]    Physical Exam Vitals:  Vitals:   05/14/19 1137  BP: 120/74  Pulse: 82   Patient's last menstrual period was 01/30/2019 (approximate).  General: NAD HEENT: normocephalic, anicteric Pulmonary: No increased work of breathing Neurologic: Grossly intact Psychiatric: mood appropriate, affect full  Assessment: 28 y.o. 0000000 with uncertain pregnancy viability  Plan: Problem List Items Addressed This Visit    None    Visit Diagnoses    Encounter to determine fetal viability of pregnancy, single or unspecified fetus    -  Primary   Relevant Orders   US OB Transvaginal   Pregnancy with uncertain fetal viability, single or unspecified fetus         Ultrasound today showed a single gestational sac measuring 20 mm with no fetal pole, heartbeat, or yolk sac present.  We discussed these findings, and she wishes to proceed with a follow up ultrasound in one week to be certain about pregnancy viability.  Avel Sensor, CNM 05/14/2019  12:38 PM

## 2019-05-19 ENCOUNTER — Encounter: Payer: Self-pay | Admitting: Emergency Medicine

## 2019-05-19 ENCOUNTER — Emergency Department: Payer: Managed Care, Other (non HMO)

## 2019-05-19 ENCOUNTER — Emergency Department
Admission: EM | Admit: 2019-05-19 | Discharge: 2019-05-19 | Disposition: A | Discharge status: Home / Self Care | Payer: Managed Care, Other (non HMO) | Attending: Emergency Medicine | Admitting: Emergency Medicine

## 2019-05-19 DIAGNOSIS — O039 Complete or unspecified spontaneous abortion without complication: Secondary | ICD-10-CM | POA: Diagnosis present

## 2019-05-19 DIAGNOSIS — Z3A01 Less than 8 weeks gestation of pregnancy: Secondary | ICD-10-CM | POA: Diagnosis not present

## 2019-05-19 DIAGNOSIS — Z79899 Other long term (current) drug therapy: Secondary | ICD-10-CM | POA: Insufficient documentation

## 2019-05-19 DIAGNOSIS — F17210 Nicotine dependence, cigarettes, uncomplicated: Secondary | ICD-10-CM | POA: Insufficient documentation

## 2019-05-19 DIAGNOSIS — N939 Abnormal uterine and vaginal bleeding, unspecified: Secondary | ICD-10-CM

## 2019-05-19 DIAGNOSIS — R102 Pelvic and perineal pain: Secondary | ICD-10-CM

## 2019-05-19 LAB — COMPREHENSIVE METABOLIC PANEL
ALT: 13 U/L (ref 0–44)
AST: 15 U/L (ref 15–41)
Albumin: 3.7 g/dL (ref 3.5–5.0)
Alkaline Phosphatase: 53 U/L (ref 38–126)
Anion gap: 9 (ref 5–15)
BUN: 17 mg/dL (ref 6–20)
CO2: 23 mmol/L (ref 22–32)
Calcium: 9.2 mg/dL (ref 8.9–10.3)
Chloride: 107 mmol/L (ref 98–111)
Creatinine, Ser: 0.91 mg/dL (ref 0.44–1.00)
GFR calc Af Amer: >60 mL/min (ref >60)
GFR calc non Af Amer: >60 mL/min (ref >60)
Glucose, Bld: 152 mg/dL — ABNORMAL HIGH (ref 70–99)
Potassium: 3.6 mmol/L (ref 3.5–5.1)
Sodium: 139 mmol/L (ref 135–145)
Total Bilirubin: 0.4 mg/dL (ref 0.3–1.2)
Total Protein: 6.4 g/dL — ABNORMAL LOW (ref 6.5–8.1)

## 2019-05-19 LAB — CBC
HCT: 35 % — ABNORMAL LOW (ref 36.0–46.0)
Hemoglobin: 11.5 g/dL — ABNORMAL LOW (ref 12.0–15.0)
MCH: 29.4 pg (ref 26.0–34.0)
MCHC: 32.9 g/dL (ref 30.0–36.0)
MCV: 89.5 fL (ref 80.0–100.0)
Platelets: 400 10*3/uL (ref 150–400)
RBC: 3.91 MIL/uL (ref 3.87–5.11)
RDW: 12.5 % (ref 11.5–15.5)
WBC: 15.5 10*3/uL — ABNORMAL HIGH (ref 4.0–10.5)
nRBC: 0 % (ref 0.0–0.2)

## 2019-05-19 LAB — ABO/RH: ABO/RH(D): O POS

## 2019-05-19 MED ORDER — SODIUM CHLORIDE 0.9 % IV SOLN
Freq: Once | INTRAVENOUS | Status: AC
  Administered 2019-05-19: 05:00:00 via INTRAVENOUS

## 2019-05-19 MED ORDER — OXYCODONE-ACETAMINOPHEN 5-325 MG PO TABS: 1.0000 | ORAL_TABLET | ORAL | 0 refills | Status: DC | PRN

## 2019-05-19 MED ORDER — MORPHINE SULFATE (PF) 2 MG/ML IV SOLN: 2.0000 mg | Freq: Once | INTRAVENOUS | Status: DC

## 2019-05-19 MED ORDER — ONDANSETRON HCL 4 MG/2ML IJ SOLN
4.0000 mg | Freq: Once | INTRAMUSCULAR | Status: AC
  Administered 2019-05-19: 03:00:00 4 mg via INTRAVENOUS

## 2019-05-19 MED ORDER — MORPHINE SULFATE (PF) 4 MG/ML IV SOLN
4.0000 mg | Freq: Once | INTRAVENOUS | Status: AC
  Administered 2019-05-19: 03:00:00 4 mg via INTRAVENOUS

## 2019-05-19 MED ORDER — ONDANSETRON HCL 4 MG/2ML IJ SOLN
4.0000 mg | Freq: Once | INTRAMUSCULAR | Status: AC
  Administered 2019-05-19: 05:00:00 4 mg via INTRAVENOUS
  Filled 2019-05-19: qty 2

## 2019-05-19 MED ORDER — MORPHINE SULFATE (PF) 4 MG/ML IV SOLN
4.0000 mg | Freq: Once | INTRAVENOUS | Status: DC
  Filled 2019-05-19: qty 1

## 2019-05-19 MED ORDER — MORPHINE SULFATE (PF) 4 MG/ML IV SOLN: 4.0000 mg | Freq: Once | INTRAVENOUS | Status: DC

## 2019-05-19 NOTE — ED Notes (Signed)
Patient transported to Ultrasound 

## 2019-05-19 NOTE — ED Triage Notes (Signed)
Pt arrived via EMS with vaginal bleeding. Pt approx. [redacted] weeks pregnant and started to pass "Large clots last night at work." Pt is flush in face and asking to go to restroom due too large clot pass. Pt helped to restroom with multiple clots. Pt taken to room 53 Flex. MD, Owens Shark at bedside.

## 2019-05-19 NOTE — ED Notes (Signed)
ED Provider at bedside. 

## 2019-05-19 NOTE — ED Provider Notes (Signed)
[facility] Emergency Department Provider Note ______________   First MD Initiated Contact with Patient 05/19/19 [phone]     (approximate)  I have reviewed the triage vital signs and the nursing notes.   HISTORY  Chief Complaint Vaginal Bleeding   HPI Susan Hubbard is a 28 y.o. female G4P1 with 3 previous elective abortions approximately [redacted] weeks pregnant presents to the emergency department 10 out of 10 pelvic pain with vaginal bleeding with large clots which began at work last night.  Patient with 10 out of 10 pelvic pain at present.  Patient states that she has been having vaginal bleeding since October 8 with varying intensity.        Past Medical History:  Diagnosis Date   Anemia    Bacterial vaginosis 11/11/2015   History of   Chlamydia    Chronic kidney disease    h/o kidney stones   Hyperlipidemia    Irregular menses    Oligomenorrhea    PCOS (polycystic ovarian syndrome)    PCOS (polycystic ovarian syndrome)    Vaccine for human papilloma virus (HPV) types 6, 11, 16, and 18 administered    Vitamin D deficiency    Vitamin D deficiency     Patient Active Problem List   Diagnosis Date Noted   Right hand pain 09/20/2016   Bacterial vaginosis 11/11/2015   Placental site nodule 11/11/2015   Abortion 05/18/2015   Vitamin D deficiency 02/05/2015   Obesity 02/05/2015   Pure hypercholesterolemia 02/05/2015   Kidney stones 02/05/2015   Sleep disturbance 02/05/2015   Acne 02/05/2015   Irregular menses 02/05/2015   Tobacco abuse 02/05/2015   Eczema 02/05/2015   History of PCOS 02/05/2015    Past Surgical History:  Procedure Laterality Date   CESAREAN SECTION     HYSTEROSCOPY W/D&C N/A 10/20/2015   Procedure: DILATATION AND CURETTAGE /HYSTEROSCOPY;  Surgeon: Malachy Mood, MD;  Location: [facility] ORS;  Service: Gynecology;  Laterality: N/A;    Prior to Admission medications   Medication Sig Start Date End  Date Taking? Authorizing Provider  hydrocortisone 2.5 % cream Apply 1 application topically 2 (two) times daily. 01/15/19   [provider]  Multiple Vitamins-Minerals (MULTIVITAMIN WITH MINERALS) tablet Take 1 tablet by mouth daily.    [provider]  oxyCODONE-acetaminophen (PERCOCET) 5-325 MG tablet Take 1 tablet by mouth every 4 (four) hours as needed for severe pain. 05/19/19 05/18/20  Gregor Hams, MD    Allergies Augmentin [amoxicillin-pot clavulanate]  Family History  Problem Relation Age of Onset   Diabetes Mother        type II   Hyperlipidemia Mother    Hypertension Mother    GER disease Mother    Sleep apnea Mother    Hypertension Father    Hyperlipidemia Maternal Grandmother    Diabetes Maternal Grandmother        Type II   Cancer Maternal Grandfather        Lung cancer   Hypertension Paternal Grandmother    Cancer Paternal Grandfather    Diabetes Paternal Grandfather    Hyperlipidemia Paternal Grandfather    Cataracts Paternal Grandfather     Social History Social History   Tobacco Use   Smoking status: Current Every Day Smoker    Packs/day: 0.25    Years: 8.00    Pack years: 2.00    Types: Cigarettes   Smokeless tobacco: Never Used   Tobacco comment: quit 2019  Substance Use Topics  Alcohol use: No   Drug use: No    Review of Systems Constitutional: No fever/chills Eyes: No visual changes. ENT: No sore throat. Cardiovascular: Denies chest pain. Respiratory: Denies shortness of breath. Gastrointestinal: No abdominal pain.  No nausea, no vomiting.  No diarrhea.  No constipation. Genitourinary: Positive for pelvic pain and vaginal bleeding. Musculoskeletal: Negative for neck pain.  Negative for back pain. Integumentary: Negative for rash. Neurological: Negative for headaches, focal weakness or numbness.  ____________________________________________   PHYSICAL EXAM:  VITAL SIGNS: ED Triage Vitals  Enc  Vitals Group     BP      Pulse      Resp      Temp      Temp src      SpO2      Weight      Height      Head Circumference      Peak Flow      Pain Score      Pain Loc      Pain Edu?      Excl. in [place]?     Constitutional: Alert and oriented.  Apparent discomfort Eyes: Conjunctivae are normal.  Mouth/Throat: Patient is wearing a mask. Neck: No stridor.  No meningeal signs.   Cardiovascular: Normal rate, regular rhythm. Good peripheral circulation. Grossly normal heart sounds. Respiratory: Normal respiratory effort.  No retractions. Gastrointestinal: Soft and nontender. No distention.  Genitourinary: Moderate vaginal bleeding noted clot/tissue noted in the cervical os which was removed with ring forceps Musculoskeletal: No lower extremity tenderness nor edema. No gross deformities of extremities. Neurologic:  Normal speech and language. No gross focal neurologic deficits are appreciated.  Skin:  Skin is warm, dry and intact. Psychiatric: Mood and affect are normal. Speech and behavior are normal.  ____________________________________________   LABS (all labs ordered are listed, but only abnormal results are displayed)  Labs Reviewed  CBC - Abnormal; Notable for the following components:      Result Value   WBC 15.5 (*)    Hemoglobin 11.5 (*)    HCT 35.0 (*)    All other components within normal limits  COMPREHENSIVE METABOLIC PANEL - Abnormal; Notable for the following components:   Glucose, Bld 152 (*)    Total Protein 6.4 (*)    All other components within normal limits  ABO/RH  SURGICAL PATHOLOGY   _________ RADIOLOGY I, [place] Ernst Bowler, personally viewed and evaluated these images (plain radiographs) as part of my medical decision making, as well as reviewing the written report by the radiologist.  ED MD interpretation: No intrauterine or ectopic pregnancy visualized at this time  Official radiology report(s): US Ob Less Than 14 Weeks With Ob  Transvaginal  Result Date: 05/19/2019 CLINICAL DATA:  Vaginal bleeding and pelvic pain. Fifteen weeks and 4 days pregnant by last menstrual period. EXAM: OBSTETRIC <14 WK ULTRASOUND TECHNIQUE: Transabdominal ultrasound was performed for evaluation of the gestation as well as the maternal uterus and adnexal regions. COMPARISON:  05/09/2019 at [facility]. FINDINGS: Intrauterine gestational sac: Not visualized Yolk sac:  Not visualized Embryo:  Not visualized Cardiac Activity: Not visualized Maternal uterus/adnexae: Interval elongated and mildly irregular fluid collection in the cervical canal, measuring 3.7 x 2.5 x 1.1 cm. The previously demonstrated intrauterine gestational sac is no longer seen in the uterus. Normal appearing right ovary. The left ovary was not visualized. No adnexal mass or free peritoneal fluid. IMPRESSION: 1. Interval passage of the previously demonstrated intrauterine gestational sac or pseudo gestational  sac into the cervical canal with no interval fetal pole or yolk sac. This is compatible with impending expulsion of the sac. 2. No intrauterine or ectopic pregnancy visualized at this time. Correlation with serial quantitative beta HCG determinations is recommended. 3. Nonvisualized left ovary. Electronically Signed   By: Claudie Revering M.D.   On: 05/19/2019 04:52      Procedures   ____________________________________________   INITIAL IMPRESSION / MDM / ASSESSMENT AND PLAN / ED COURSE  As part of my medical decision making, I reviewed the following data within the electronic MEDICAL RECORD NUMBER   28 year old female presented with above-stated history and physical exam secondary to suspected miscarriage.  Ultrasound revealed no intrauterine pregnancy.  Pelvic exam revealed what appears to be gestational sac in the cervical os which was removed.  Patient was given IV morphine in the emergency department with improvement of pain.  Patient's bleeding and pain Following removal was  improved.  Patient will be prescribed Percocet for home with recommendation to follow-up with Dr. Nechama Guard.  I did discuss the patient with Dr. Nechama Guard OB/GYN. ____________________________________________  FINAL CLINICAL IMPRESSION(S) / ED DIAGNOSES  Final diagnoses:  Miscarriage     MEDICATIONS GIVEN DURING THIS VISIT:  Medications  ondansetron (ZOFRAN) injection 4 mg (4 mg Intravenous Given 05/19/19 0517)  morphine 4 MG/ML injection 4 mg (4 mg Intravenous Given 05/19/19 0238)  ondansetron (ZOFRAN) injection 4 mg (4 mg Intravenous Given 05/19/19 0238)  0.9 %  sodium chloride infusion ( Intravenous Stopped 05/19/19 0713)     ED Discharge Orders         Ordered    oxyCODONE-acetaminophen (PERCOCET) 5-325 MG tablet  Every 4 hours PRN     05/19/19 0655          *Please note:  Susan Hubbard was evaluated in Emergency Department on 05/19/2019 for the symptoms described in the history of present illness. She was evaluated in the context of the global COVID-19 pandemic, which necessitated consideration that the patient might be at risk for infection with the SARS-CoV-2 virus that causes COVID-19. Institutional protocols and algorithms that pertain to the evaluation of patients at risk for COVID-19 are in a state of rapid change based on information released by regulatory bodies including the CDC and federal and state organizations. These policies and algorithms were followed during the patient's care in the ED.  Some ED evaluations and interventions may be delayed as a result of limited staffing during the pandemic.*  Note:  This document was prepared using Dragon voice recognition software and may include unintentional dictation errors.   Gregor Hams, MD 05/19/19 2352

## 2019-05-20 ENCOUNTER — Telehealth: Payer: Self-pay

## 2019-05-20 NOTE — Telephone Encounter (Signed)
Pt calling to let JYS know she had a miscarriage Saturday - went to ED; cancelled u/s appt - wanted JYS to know why.  [phone]

## 2019-05-21 ENCOUNTER — Ambulatory Visit: Payer: Managed Care, Other (non HMO)

## 2019-05-21 ENCOUNTER — Ambulatory Visit: Payer: Managed Care, Other (non HMO) | Admitting: Maternal Newborn

## 2019-05-22 LAB — SURGICAL PATHOLOGY

## 2019-05-22 NOTE — Telephone Encounter (Signed)
Pt called triage line today stating she had a miscarriage on 10/31. She is having cramping that is very uncomfortable. They told her she would have bleeding and she expected slight cramping but nothing like she is experiencing.   She usually sees JS, please advise in her absence. There are no openings for today

## 2019-05-22 NOTE — Telephone Encounter (Signed)
Returned patient's cal. Seen in ER 11/1 and was told she had a miscarriage. Prior gestational sac seen on ultrasound earlier was no longer visible. Is RN positive. Patient had called with concerns regarding cramping. She reports that her bleeding is slowing-and more like a menses now. The cramping has worsened, but she has not taken any medication for the cramping. She was given Percocet at the ER. Recommend she take a Percocet and 400 mgm of Motrin. To let us know if the cramping is not getting better. Dalia Heading, CNM

## 2019-10-28 NOTE — Progress Notes (Signed)
PCP:  Patient, No Pcp Per   Chief Complaint  Patient presents with  . Gynecologic Exam    RB sore for the past week  . Vaginal Discharge    no odor, itchiness or irritation  . [employer] Employee     HPI:      Ms. Susan Hubbard is a 29 y.o. [phone] who LMP was Patient's last menstrual period was 10/07/2019 (approximate)., presents today for her annual examination.  Her menses are regular every 28-30 days, lasting 5 days.  Dysmenorrhea severe, occurring first 1-2 days of flow, sometimes missing work. Pain worse in her back. NSAIDs not helpful in past, hasn't tried heating pad. She does not have intermenstrual bleeding. S/p SAB 10/20  Sex activity: single partner, contraception - none. Declines BC. May want BTL.  Last Pap: 05/25/18  Results were: no abnormalities  Hx of STDs: chlamydia. Would like STD testing today. Hx of yeast vag and BV in past. Has had increased vag d/c without odor/irritation for a couple months. No meds to treat. Not taking probiotics, considering starting boric acid supp. Not using condoms.   There is no FH of breast cancer. There is no FH of ovarian cancer. The patient does do self-breast exams. Has noted RT breast tenderness recently without masses. Drinks caffeine.  Tobacco use: The patient denies current or previous tobacco use. Alcohol use: none No drug use.  Exercise: not active  She does not get adequate calcium and Vitamin D in her diet. Hasn't had recent fasting labs.   Past Medical History:  Diagnosis Date  . Anemia   . Bacterial vaginosis 11/11/2015   History of  . Chlamydia   . Chronic kidney disease    h/o kidney stones  . Hyperlipidemia   . Irregular menses   . Oligomenorrhea   . PCOS (polycystic ovarian syndrome)   . PCOS (polycystic ovarian syndrome)   . Vaccine for human papilloma virus (HPV) types 6, 11, 16, and 18 administered   . Vitamin D deficiency   . Vitamin D deficiency     Past Surgical History:  Procedure Laterality  Date  . CESAREAN SECTION    . HYSTEROSCOPY WITH D & C N/A 10/20/2015   Procedure: DILATATION AND CURETTAGE /HYSTEROSCOPY;  Surgeon: Malachy Mood, MD;  Location: [facility] ORS;  Service: Gynecology;  Laterality: N/A;    Family History  Problem Relation Age of Onset  . Diabetes Mother        type II  . Hyperlipidemia Mother   . Hypertension Mother   . GER disease Mother   . Sleep apnea Mother   . Hypertension Father   . Hyperlipidemia Maternal Grandmother   . Diabetes Maternal Grandmother        Type II  . Cancer Maternal Grandfather        Lung cancer  . Hypertension Paternal Grandmother   . Brain cancer Paternal Grandmother   . Lung cancer Paternal Grandmother   . Cancer Paternal Grandfather   . Diabetes Paternal Grandfather   . Hyperlipidemia Paternal Grandfather   . Cataracts Paternal Grandfather     Social History   Socioeconomic History  . Marital status: Single    Spouse name: Not on file  . Number of children: Not on file  . Years of education: Not on file  . Highest education level: Not on file  Occupational History  . Not on file  Tobacco Use  . Smoking status: Current Every Day Smoker    Packs/day: 0.25  Years: 8.00    Pack years: 2.00    Types: Cigarettes  . Smokeless tobacco: Never Used  . Tobacco comment: quit 2019  Substance and Sexual Activity  . Alcohol use: No  . Drug use: No  . Sexual activity: Yes    Birth control/protection: None  Other Topics Concern  . Not on file  Social History Narrative  . Not on file   Social Determinants of Health   Financial Resource Strain:   . Difficulty of Paying Living Expenses:   Food Insecurity:   . Worried About Charity fundraiser in the Last Year:   . Arboriculturist in the Last Year:   Transportation Needs:   . Film/video editor (Medical):   Marland Kitchen Lack of Transportation (Non-Medical):   Physical Activity:   . Days of Exercise per Week:   . Minutes of Exercise per Session:   Stress:   . Feeling  of Stress :   Social Connections:   . Frequency of Communication with Friends and Family:   . Frequency of Social Gatherings with Friends and Family:   . Attends Religious Services:   . Active Member of Clubs or Organizations:   . Attends Archivist Meetings:   Marland Kitchen Marital Status:   Intimate Partner Violence:   . Fear of Current or Ex-Partner:   . Emotionally Abused:   Marland Kitchen Physically Abused:   . Sexually Abused:      Current Outpatient Medications:  .  hydrocortisone 2.5 % cream, Apply 1 application topically 2 (two) times daily., Disp: , Rfl:  .  metroNIDAZOLE (FLAGYL) 500 MG tablet, Take 1 tablet (500 mg total) by mouth 2 (two) times daily for 7 days., Disp: 14 tablet, Rfl: 0     ROS:  Review of Systems  Constitutional: Positive for fatigue. Negative for fever and unexpected weight change.  Respiratory: Negative for cough, shortness of breath and wheezing.   Cardiovascular: Negative for chest pain, palpitations and leg swelling.  Gastrointestinal: Negative for blood in stool, constipation, diarrhea, nausea and vomiting.  Endocrine: Negative for cold intolerance, heat intolerance and polyuria.  Genitourinary: Positive for vaginal discharge. Negative for dyspareunia, dysuria, flank pain, frequency, genital sores, hematuria, menstrual problem, pelvic pain, urgency, vaginal bleeding and vaginal pain.  Musculoskeletal: Negative for back pain, joint swelling and myalgias.  Skin: Negative for rash.  Neurological: Negative for dizziness, syncope, light-headedness, numbness and headaches.  Hematological: Negative for adenopathy.  Psychiatric/Behavioral: Negative for agitation, confusion, sleep disturbance and suicidal ideas. The patient is not nervous/anxious.    BREAST: tenderness   Objective: BP 128/76   Ht 5\' 3"  (1.6 m)   Wt 222 lb (100.7 kg)   LMP 10/07/2019 (Approximate)   BMI 39.33 kg/m    Physical Exam Constitutional:      Appearance: She is well-developed.    Genitourinary:     Vulva, cervix, uterus, right adnexa and left adnexa normal.     No vulval lesion or tenderness noted.     Vaginal discharge present.     No vaginal erythema or tenderness.     No cervical polyp.     Uterus is not enlarged or tender.     No right or left adnexal mass present.     Right adnexa not tender.     Left adnexa not tender.  Neck:     Thyroid: No thyromegaly.  Cardiovascular:     Rate and Rhythm: Normal rate and regular rhythm.     Heart  sounds: Normal heart sounds. No murmur.  Pulmonary:     Effort: Pulmonary effort is normal.     Breath sounds: Normal breath sounds.  Chest:     Breasts:        Right: Tenderness present. No mass, nipple discharge or skin change.        Left: No mass, nipple discharge, skin change or tenderness.  Abdominal:     Palpations: Abdomen is soft.     Tenderness: There is no abdominal tenderness. There is no guarding.  Musculoskeletal:        General: Normal range of motion.     Cervical back: Normal range of motion.  Neurological:     General: No focal deficit present.     Mental Status: She is alert and oriented to person, place, and time.     Cranial Nerves: No cranial nerve deficit.  Skin:    General: Skin is warm and dry.  Psychiatric:        Mood and Affect: Mood normal.        Behavior: Behavior normal.        Thought Content: Thought content normal.        Judgment: Judgment normal.  Vitals reviewed.     Results: Results for orders placed or performed in visit on 10/29/19 (from the past 24 hour(s))  POCT Wet Prep with KOH     Status: Abnormal   Collection Time: 10/29/19  2:54 PM  Result Value Ref Range   Trichomonas, UA Negative    Clue Cells Wet Prep HPF POC pos    Epithelial Wet Prep HPF POC     Yeast Wet Prep HPF POC neg    Bacteria Wet Prep HPF POC     RBC Wet Prep HPF POC     WBC Wet Prep HPF POC     KOH Prep POC Positive (A) Negative    Assessment/Plan: Encounter for annual routine  gynecological examination  Dysmenorrhea--try ibup 800 mg TID and start before menses starts. Heating pad to back. Declines BC. F/u prn  Screening for STD (sexually transmitted disease) - Plan: Chlamydia/Gonococcus/Trichomonas, NAA,   Bacterial vaginosis - Plan: metroNIDAZOLE (FLAGYL) 500 MG tablet, POCT Wet Prep with KOH; Pos sx/wet prep. Rx flagyl. No EtOH. F/u prn. Add probiotics, boric acid supp, use condoms.   Encounter for other general counseling or advice on contraception--pt considering BTL. F/u with MD for consult prn.  Breast tenderness--no masses on exam. D/C caffeine. F/u prn.   Blood tests for routine general physical examination - Plan: Comprehensive metabolic panel, Lipid panel, Hemoglobin A1c, VITAMIN D 25 Hydroxy (Vit-D Deficiency, Fractures), Chlamydia/Gonococcus/Trichomonas, NAA  Screening cholesterol level - Plan: Lipid panel  Encounter for vitamin deficiency screening - Plan: VITAMIN D 25 Hydroxy (Vit-D Deficiency, Fractures)  Screening for diabetes mellitus - Plan: Hemoglobin A1c  Meds ordered this encounter  Medications  . metroNIDAZOLE (FLAGYL) 500 MG tablet    Sig: Take 1 tablet (500 mg total) by mouth 2 (two) times daily for 7 days.    Dispense:  14 tablet    Refill:  0    Order Specific Question:   Supervising Provider    Answer:   Gae Dry [id number]             GYN counsel breast self exam, adequate intake of calcium and vitamin D, diet and exercise     F/U  Return in about 1 year (around 10/28/2020).  Aja Whitehair B. Nael Petrosyan, PA-C 10/29/2019 4:04 PM

## 2019-10-29 ENCOUNTER — Ambulatory Visit (INDEPENDENT_AMBULATORY_CARE_PROVIDER_SITE_OTHER): Payer: Managed Care, Other (non HMO) | Admitting: Obstetrics and Gynecology

## 2019-10-29 ENCOUNTER — Encounter: Payer: Self-pay | Admitting: Obstetrics and Gynecology

## 2019-10-29 ENCOUNTER — Other Ambulatory Visit: Payer: Self-pay

## 2019-10-29 VITALS — BP 128/76 | Ht 63.0 in | Wt 222.0 lb

## 2019-10-29 DIAGNOSIS — Z113 Encounter for screening for infections with a predominantly sexual mode of transmission: Secondary | ICD-10-CM | POA: Diagnosis not present

## 2019-10-29 DIAGNOSIS — Z01419 Encounter for gynecological examination (general) (routine) without abnormal findings: Secondary | ICD-10-CM | POA: Diagnosis not present

## 2019-10-29 DIAGNOSIS — N760 Acute vaginitis: Secondary | ICD-10-CM | POA: Diagnosis not present

## 2019-10-29 DIAGNOSIS — Z13220 Encounter for screening for lipoid disorders: Secondary | ICD-10-CM

## 2019-10-29 DIAGNOSIS — B9689 Other specified bacterial agents as the cause of diseases classified elsewhere: Secondary | ICD-10-CM | POA: Diagnosis not present

## 2019-10-29 DIAGNOSIS — Z1321 Encounter for screening for nutritional disorder: Secondary | ICD-10-CM

## 2019-10-29 DIAGNOSIS — N946 Dysmenorrhea, unspecified: Secondary | ICD-10-CM | POA: Insufficient documentation

## 2019-10-29 DIAGNOSIS — Z131 Encounter for screening for diabetes mellitus: Secondary | ICD-10-CM

## 2019-10-29 DIAGNOSIS — Z3009 Encounter for other general counseling and advice on contraception: Secondary | ICD-10-CM

## 2019-10-29 DIAGNOSIS — N644 Mastodynia: Secondary | ICD-10-CM

## 2019-10-29 DIAGNOSIS — Z0000 Encounter for general adult medical examination without abnormal findings: Secondary | ICD-10-CM

## 2019-10-29 LAB — POCT WET PREP WITH KOH
Clue Cells Wet Prep HPF POC: POSITIVE
KOH Prep POC: POSITIVE — AB
Trichomonas, UA: NEGATIVE
Yeast Wet Prep HPF POC: NEGATIVE

## 2019-10-29 MED ORDER — METRONIDAZOLE 500 MG PO TABS: 500.0000 mg | ORAL_TABLET | Freq: Two times a day (BID) | ORAL | 0 refills | Status: AC

## 2019-10-29 NOTE — Patient Instructions (Signed)
I value your feedback and entrusting us with your care. If you get a [facility] patient survey, I would appreciate you taking the time to let us know about your experience today. Thank you!  As of June 27, 2019, your lab results will be released to your MyChart immediately, before I even have a chance to see them. Please give me time to review them and contact you if there are any abnormalities. Thank you for your patience.  

## 2019-11-01 LAB — CHLAMYDIA/GONOCOCCUS/TRICHOMONAS, NAA
Chlamydia by NAA: NEGATIVE
Gonococcus by NAA: NEGATIVE
Trich vag by NAA: NEGATIVE

## 2019-11-14 ENCOUNTER — Encounter: Payer: Self-pay | Admitting: Obstetrics and Gynecology

## 2019-11-14 ENCOUNTER — Other Ambulatory Visit: Payer: Self-pay

## 2019-11-14 ENCOUNTER — Ambulatory Visit (INDEPENDENT_AMBULATORY_CARE_PROVIDER_SITE_OTHER): Payer: Managed Care, Other (non HMO) | Admitting: Obstetrics and Gynecology

## 2019-11-14 VITALS — BP 111/64 | HR 73 | Ht 63.0 in | Wt 224.0 lb

## 2019-11-14 DIAGNOSIS — N912 Amenorrhea, unspecified: Secondary | ICD-10-CM | POA: Diagnosis not present

## 2019-11-14 DIAGNOSIS — Z131 Encounter for screening for diabetes mellitus: Secondary | ICD-10-CM

## 2019-11-14 DIAGNOSIS — Z0000 Encounter for general adult medical examination without abnormal findings: Secondary | ICD-10-CM

## 2019-11-14 DIAGNOSIS — Z331 Pregnant state, incidental: Secondary | ICD-10-CM

## 2019-11-14 DIAGNOSIS — Z1321 Encounter for screening for nutritional disorder: Secondary | ICD-10-CM

## 2019-11-14 DIAGNOSIS — Z13220 Encounter for screening for lipoid disorders: Secondary | ICD-10-CM

## 2019-11-14 LAB — POCT URINE PREGNANCY: Preg Test, Ur: POSITIVE — AB

## 2019-11-14 NOTE — Progress Notes (Signed)
Chief complaint: discuss tubal ligation  History of Present Illness:  Susan Hubbard is an 29 y.o. female.  She originally made her appointment to discuss tubal ligation.  Since she is late in starting her menstruation a pregnancy test was performed which was positive today.  She was unaware that she was pregnant, but was suspicious due to her late start of menstruation.  She states that she does not wish to continue with the pregnancy and asks for advice regarding management options.    Past Medical History:  Diagnosis Date  . Anemia   . Bacterial vaginosis 11/11/2015   History of  . Chlamydia   . Chronic kidney disease    h/o kidney stones  . Hyperlipidemia   . Irregular menses   . Oligomenorrhea   . PCOS (polycystic ovarian syndrome)   . PCOS (polycystic ovarian syndrome)   . Vaccine for human papilloma virus (HPV) types 6, 11, 16, and 18 administered   . Vitamin D deficiency   . Vitamin D deficiency     Past Surgical History:  Procedure Laterality Date  . CESAREAN SECTION    . HYSTEROSCOPY WITH D & C N/A 10/20/2015   Procedure: DILATATION AND CURETTAGE /HYSTEROSCOPY;  Surgeon: Malachy Mood, MD;  Location: [facility] ORS;  Service: Gynecology;  Laterality: N/A;    Family History  Problem Relation Age of Onset  . Diabetes Mother        type II  . Hyperlipidemia Mother   . Hypertension Mother   . GER disease Mother   . Sleep apnea Mother   . Hypertension Father   . Hyperlipidemia Maternal Grandmother   . Diabetes Maternal Grandmother        Type II  . Cancer Maternal Grandfather        Lung cancer  . Hypertension Paternal Grandmother   . Brain cancer Paternal Grandmother   . Lung cancer Paternal Grandmother   . Cancer Paternal Grandfather   . Diabetes Paternal Grandfather   . Hyperlipidemia Paternal Grandfather   . Cataracts Paternal Grandfather     Social History:  reports that she has been smoking cigarettes. She has a 2.00 pack-year smoking history. She has  never used smokeless tobacco. She reports that she does not drink alcohol or use drugs.  Allergies:  Allergies  Allergen Reactions  . Augmentin [Amoxicillin-Pot Clavulanate] Nausea And Vomiting    Has patient had a PCN reaction causing immediate rash, facial/tongue/throat swelling, SOB or lightheadedness with hypotension: no Has patient had a PCN reaction causing severe rash involving mucus membranes or skin necrosis: unknown Has patient had a PCN reaction that required hospitalization no Has patient had a PCN reaction occurring within the last 10 years: yes If all of the above answers are "NO", then may proceed with Cephalosporin use.      Review of Systems  Constitutional: Negative.   HENT: Negative.   Eyes: Negative.   Respiratory: Negative.   Cardiovascular: Negative.   Gastrointestinal: Negative.   Genitourinary: Negative.   Musculoskeletal: Negative.   Skin: Negative.   Neurological: Negative.   Psychiatric/Behavioral: Negative.    Physical Exam: Blood pressure 111/64, pulse 73, height 5\' 3"  (1.6 m), weight 224 lb (101.6 kg), last menstrual period 09/26/2019. Physical Exam Constitutional:      General: She is not in acute distress.    Appearance: Normal appearance.  HENT:     Head: Normocephalic and atraumatic.  Eyes:     General: No scleral icterus.    Conjunctiva/sclera:  Conjunctivae normal.  Neurological:     General: No focal deficit present.     Mental Status: She is alert and oriented to person, place, and time.     Cranial Nerves: No cranial nerve deficit.  Psychiatric:        Mood and Affect: Mood normal.        Behavior: Behavior normal.        Judgment: Judgment normal.      Results for orders placed or performed in visit on 11/14/19 (from the past 24 hour(s))  POCT urine pregnancy     Status: Abnormal   Collection Time: 11/14/19  8:34 AM  Result Value Ref Range   Preg Test, Ur Positive (A) Negative    Assessment/Plan:   ICD-10-CM   1.  Amenorrhea  N91.2 POCT urine pregnancy  2. Incidental pregnancy  Z33.1    Discussed some regional options for management of undesired pregnancy.  Also offered pregnancy services, should she wish to continue with the pregnancy.  If she does terminate the pregnancy, and still desires a tubal ligation, she may return for discussion and scheduling a month or so after she has completed her termination.  A total of 25 minutes were spent face-to-face with the patient as well as preparation, review, communication, and documentation during this encounter.    Note: all diagnostic codes associated with lab tests (apart from the urine pregnancy test) are from a prior visit and were associated with this visit as the labs were released so that she could have the tests drawn.  I was unable to remove the lab test diagnoses from this encounter.  The diagnoses listed above are the only diagnoses I would attribute to this visit.   Prentice Docker 11/14/2019, 1:20 PM

## 2019-11-15 LAB — COMPREHENSIVE METABOLIC PANEL
ALT: 15 IU/L (ref 0–32)
AST: 13 IU/L (ref 0–40)
Albumin/Globulin Ratio: 1.8 (ref 1.2–2.2)
Albumin: 4.3 g/dL (ref 3.9–5.0)
Alkaline Phosphatase: 71 IU/L (ref 39–117)
BUN/Creatinine Ratio: 12 (ref 9–23)
BUN: 9 mg/dL (ref 6–20)
Bilirubin Total: <0.2 mg/dL (ref 0.0–1.2)
CO2: 20 mmol/L (ref 20–29)
Calcium: 9.3 mg/dL (ref 8.7–10.2)
Chloride: 106 mmol/L (ref 96–106)
Creatinine, Ser: 0.75 mg/dL (ref 0.57–1.00)
GFR calc Af Amer: 125 mL/min/{1.73_m2} (ref >59)
GFR calc non Af Amer: 108 mL/min/{1.73_m2} (ref >59)
Globulin, Total: 2.4 g/dL (ref 1.5–4.5)
Glucose: 84 mg/dL (ref 65–99)
Potassium: 4.5 mmol/L (ref 3.5–5.2)
Sodium: 139 mmol/L (ref 134–144)
Total Protein: 6.7 g/dL (ref 6.0–8.5)

## 2019-11-15 LAB — LIPID PANEL
Chol/HDL Ratio: 4.6 ratio — ABNORMAL HIGH (ref 0.0–4.4)
Cholesterol, Total: 162 mg/dL (ref 100–199)
HDL: 35 mg/dL — ABNORMAL LOW (ref >39)
LDL Chol Calc (NIH): 111 mg/dL — ABNORMAL HIGH (ref 0–99)
Triglycerides: 85 mg/dL (ref 0–149)
VLDL Cholesterol Cal: 16 mg/dL (ref 5–40)

## 2019-11-15 LAB — HEMOGLOBIN A1C
Est. average glucose Bld gHb Est-mCnc: 103 mg/dL
Hgb A1c MFr Bld: 5.2 % (ref 4.8–5.6)

## 2019-11-15 LAB — VITAMIN D 25 HYDROXY (VIT D DEFICIENCY, FRACTURES): Vit D, 25-Hydroxy: 9.7 ng/mL — ABNORMAL LOW (ref 30.0–100.0)

## 2019-12-23 ENCOUNTER — Emergency Department: Payer: Managed Care, Other (non HMO)

## 2019-12-23 ENCOUNTER — Encounter: Payer: Self-pay | Admitting: Emergency Medicine

## 2019-12-23 ENCOUNTER — Emergency Department
Admission: EM | Admit: 2019-12-23 | Discharge: 2019-12-23 | Disposition: A | Discharge status: Home / Self Care | Payer: Managed Care, Other (non HMO) | Attending: Emergency Medicine | Admitting: Emergency Medicine

## 2019-12-23 ENCOUNTER — Other Ambulatory Visit: Payer: Self-pay

## 2019-12-23 DIAGNOSIS — Z87891 Personal history of nicotine dependence: Secondary | ICD-10-CM | POA: Diagnosis not present

## 2019-12-23 DIAGNOSIS — R102 Pelvic and perineal pain: Secondary | ICD-10-CM | POA: Diagnosis not present

## 2019-12-23 DIAGNOSIS — N939 Abnormal uterine and vaginal bleeding, unspecified: Secondary | ICD-10-CM | POA: Diagnosis present

## 2019-12-23 DIAGNOSIS — R1030 Lower abdominal pain, unspecified: Secondary | ICD-10-CM | POA: Diagnosis not present

## 2019-12-23 LAB — COMPREHENSIVE METABOLIC PANEL
ALT: 30 U/L (ref 0–44)
AST: 26 U/L (ref 15–41)
Albumin: 3.7 g/dL (ref 3.5–5.0)
Alkaline Phosphatase: 49 U/L (ref 38–126)
Anion gap: 10 (ref 5–15)
BUN: 11 mg/dL (ref 6–20)
CO2: 23 mmol/L (ref 22–32)
Calcium: 9 mg/dL (ref 8.9–10.3)
Chloride: 103 mmol/L (ref 98–111)
Creatinine, Ser: 0.81 mg/dL (ref 0.44–1.00)
GFR calc Af Amer: >60 mL/min (ref >60)
GFR calc non Af Amer: >60 mL/min (ref >60)
Glucose, Bld: 99 mg/dL (ref 70–99)
Potassium: 3.7 mmol/L (ref 3.5–5.1)
Sodium: 136 mmol/L (ref 135–145)
Total Bilirubin: 0.7 mg/dL (ref 0.3–1.2)
Total Protein: 7.4 g/dL (ref 6.5–8.1)

## 2019-12-23 LAB — CBC
HCT: 27.9 % — ABNORMAL LOW (ref 36.0–46.0)
Hemoglobin: 9.6 g/dL — ABNORMAL LOW (ref 12.0–15.0)
MCH: 29.4 pg (ref 26.0–34.0)
MCHC: 34.4 g/dL (ref 30.0–36.0)
MCV: 85.3 fL (ref 80.0–100.0)
Platelets: 269 10*3/uL (ref 150–400)
RBC: 3.27 MIL/uL — ABNORMAL LOW (ref 3.87–5.11)
RDW: 12.7 % (ref 11.5–15.5)
WBC: 11.1 10*3/uL — ABNORMAL HIGH (ref 4.0–10.5)
nRBC: 0 % (ref 0.0–0.2)

## 2019-12-23 LAB — CBC WITH DIFFERENTIAL/PLATELET
Abs Immature Granulocytes: 0.03 10*3/uL (ref 0.00–0.07)
Basophils Absolute: 0 10*3/uL (ref 0.0–0.1)
Basophils Relative: 0 %
Eosinophils Absolute: 0.2 10*3/uL (ref 0.0–0.5)
Eosinophils Relative: 2 %
HCT: 31.6 % — ABNORMAL LOW (ref 36.0–46.0)
Hemoglobin: 10.5 g/dL — ABNORMAL LOW (ref 12.0–15.0)
Immature Granulocytes: 0 %
Lymphocytes Relative: 17 %
Lymphs Abs: 1.6 10*3/uL (ref 0.7–4.0)
MCH: 29.2 pg (ref 26.0–34.0)
MCHC: 33.2 g/dL (ref 30.0–36.0)
MCV: 88 fL (ref 80.0–100.0)
Monocytes Absolute: 0.7 10*3/uL (ref 0.1–1.0)
Monocytes Relative: 7 %
Neutro Abs: 6.6 10*3/uL (ref 1.7–7.7)
Neutrophils Relative %: 74 %
Platelets: 298 10*3/uL (ref 150–400)
RBC: 3.59 MIL/uL — ABNORMAL LOW (ref 3.87–5.11)
RDW: 12.7 % (ref 11.5–15.5)
WBC: 9.1 10*3/uL (ref 4.0–10.5)
nRBC: 0 % (ref 0.0–0.2)

## 2019-12-23 LAB — ABO/RH: ABO/RH(D): O POS

## 2019-12-23 LAB — HCG, QUANTITATIVE, PREGNANCY: hCG, Beta Chain, Quant, S: 6062 m[IU]/mL — ABNORMAL HIGH (ref <5)

## 2019-12-23 MED ORDER — ONDANSETRON HCL 4 MG/2ML IJ SOLN
4.0000 mg | Freq: Once | INTRAMUSCULAR | Status: AC
  Administered 2019-12-23: 4 mg via INTRAVENOUS
  Filled 2019-12-23: qty 2

## 2019-12-23 MED ORDER — HYDROMORPHONE HCL 1 MG/ML IJ SOLN
1.0000 mg | Freq: Once | INTRAMUSCULAR | Status: AC
  Administered 2019-12-23: 1 mg via INTRAVENOUS
  Filled 2019-12-23: qty 1

## 2019-12-23 MED ORDER — HYDROCODONE-ACETAMINOPHEN 5-325 MG PO TABS: 1.0000 | ORAL_TABLET | Freq: Four times a day (QID) | ORAL | 0 refills | Status: DC | PRN

## 2019-12-23 MED ORDER — HYDROMORPHONE HCL 1 MG/ML IJ SOLN: 1.0000 mg | Freq: Once | INTRAMUSCULAR | Status: DC

## 2019-12-23 MED ORDER — MISOPROSTOL 200 MCG PO TABS: 600.0000 ug | ORAL_TABLET | Freq: Once | ORAL | Status: DC

## 2019-12-23 MED ORDER — KETOROLAC TROMETHAMINE 30 MG/ML IJ SOLN
15.0000 mg | Freq: Once | INTRAMUSCULAR | Status: AC
  Administered 2019-12-23: 15 mg via INTRAVENOUS
  Filled 2019-12-23: qty 1

## 2019-12-23 MED ORDER — ONDANSETRON 4 MG PO TBDP
4.0000 mg | ORAL_TABLET | Freq: Three times a day (TID) | ORAL | 0 refills | Status: DC | PRN
Start: 2019-12-23 — End: 2020-10-29

## 2019-12-23 MED ORDER — MISOPROSTOL 200 MCG PO TABS
600.0000 ug | ORAL_TABLET | Freq: Once | ORAL | Status: AC
  Administered 2019-12-23: 600 ug via VAGINAL
  Filled 2019-12-23: qty 3

## 2019-12-23 MED ORDER — HYDROCODONE-ACETAMINOPHEN 5-325 MG PO TABS
1.0000 | ORAL_TABLET | Freq: Once | ORAL | Status: AC
  Administered 2019-12-23: 1 via ORAL
  Filled 2019-12-23: qty 1

## 2019-12-23 NOTE — ED Triage Notes (Signed)
Pt presents to ED with severe lower abd pain and vaginal bleeding since Friday. Pt states she is currently [redacted] weeks pregnant and thinks she is having a miscarriage. Not been seen by obgyn. Passing clots.

## 2019-12-23 NOTE — ED Provider Notes (Signed)
-----------------------------------------   10:54 PM on 12/23/2019 -----------------------------------------  I have seen and evaluated the patient.  Patient states she is feeling better after Cytotec administration and she has received pain medication as well.  Patient states he has not felt any further significant vaginal bleeding or clot passage.  We will continue to closely monitor in the emergency department.  As long as the patient continues to feel well and improved clinically I believe she would be safe for discharge home with OB follow-up.  We will discharge with a short course of pain medication and have the patient follow-up with Dr. Glennon Mac.   Harvest Dark, MD 12/23/19 2255

## 2019-12-23 NOTE — ED Provider Notes (Signed)
[facility] Emergency Department Provider Note  ____________________________________________   First MD Initiated Contact with Patient 12/23/19 1933     (approximate)  I have reviewed the triage vital signs and the nursing notes.   HISTORY  Chief Complaint Vaginal Bleeding    HPI Susan Hubbard is a 29 y.o. female with PMHx CKD, PCOS, here with abdominal pain. Pt just underwent D&C in [place] for [redacted]w[redacted]d pregnancy. She initially felt fine then reports that over the past 24 hours, has had acute onset of severe, cramping, aching, lower abd pain along with significant vaginal bleeding. She is passing large, stringy clots every time she tries to go to the restroom. She has a h/o SAB in the past that felt similar. She has been taking APAP w/o significant relief. No alleviating factors. No other complaints. No urinary sx.        Past Medical History:  Diagnosis Date  . Anemia   . Bacterial vaginosis 11/11/2015   History of  . Chlamydia   . Chronic kidney disease    h/o kidney stones  . Hyperlipidemia   . Irregular menses   . Oligomenorrhea   . PCOS (polycystic ovarian syndrome)   . PCOS (polycystic ovarian syndrome)   . Vaccine for human papilloma virus (HPV) types 6, 11, 16, and 18 administered   . Vitamin D deficiency   . Vitamin D deficiency     Patient Active Problem List   Diagnosis Date Noted  . Dysmenorrhea 10/29/2019  . Right hand pain 09/20/2016  . Bacterial vaginosis 11/11/2015  . Placental site nodule 11/11/2015  . Abortion 05/18/2015  . Vitamin D deficiency 02/05/2015  . Obesity 02/05/2015  . Pure hypercholesterolemia 02/05/2015  . Kidney stones 02/05/2015  . Sleep disturbance 02/05/2015  . Acne 02/05/2015  . Irregular menses 02/05/2015  . Tobacco abuse 02/05/2015  . Eczema 02/05/2015  . History of PCOS 02/05/2015    Past Surgical History:  Procedure Laterality Date  . CESAREAN SECTION    . HYSTEROSCOPY WITH D & C N/A  10/20/2015   Procedure: DILATATION AND CURETTAGE /HYSTEROSCOPY;  Surgeon: Malachy Mood, MD;  Location: [facility] ORS;  Service: Gynecology;  Laterality: N/A;    Prior to Admission medications   Medication Sig Start Date End Date Taking? Authorizing Provider  HYDROcodone-acetaminophen (NORCO/VICODIN) 5-325 MG tablet Take 1-2 tablets by mouth every 6 (six) hours as needed for moderate pain or severe pain. 12/23/19 12/22/20  Duffy Bruce, MD  hydrocortisone 2.5 % cream Apply 1 application topically 2 (two) times daily. 01/15/19   [provider]  ondansetron (ZOFRAN ODT) 4 MG disintegrating tablet Take 1 tablet (4 mg total) by mouth every 8 (eight) hours as needed for nausea or vomiting. 12/23/19   Duffy Bruce, MD    Allergies Augmentin [amoxicillin-pot clavulanate]  Family History  Problem Relation Age of Onset  . Diabetes Mother        type II  . Hyperlipidemia Mother   . Hypertension Mother   . GER disease Mother   . Sleep apnea Mother   . Hypertension Father   . Hyperlipidemia Maternal Grandmother   . Diabetes Maternal Grandmother        Type II  . Cancer Maternal Grandfather        Lung cancer  . Hypertension Paternal Grandmother   . Brain cancer Paternal Grandmother   . Lung cancer Paternal Grandmother   . Cancer Paternal Grandfather   . Diabetes Paternal Grandfather   . Hyperlipidemia Paternal  Grandfather   . Cataracts Paternal Grandfather     Social History Social History   Tobacco Use  . Smoking status: Former Smoker    Packs/day: 0.25    Years: 8.00    Pack years: 2.00    Types: Cigarettes  . Smokeless tobacco: Never Used  . Tobacco comment: quit 2019  Substance Use Topics  . Alcohol use: No  . Drug use: No    Review of Systems  Review of Systems  Constitutional: Negative for fatigue and fever.  HENT: Negative for congestion and sore throat.   Eyes: Negative for visual disturbance.  Respiratory: Negative for cough and shortness of breath.     Cardiovascular: Negative for chest pain.  Gastrointestinal: Positive for abdominal pain. Negative for diarrhea, nausea and vomiting.  Genitourinary: Positive for pelvic pain, vaginal bleeding and vaginal pain. Negative for flank pain.  Musculoskeletal: Negative for back pain and neck pain.  Skin: Negative for rash and wound.  Neurological: Negative for weakness.  All other systems reviewed and are negative.    ____________________________________________  PHYSICAL EXAM:      VITAL SIGNS: ED Triage Vitals  Enc Vitals Group     BP 12/23/19 1921 131/71     Pulse Rate 12/23/19 1921 (!) 102     Resp 12/23/19 1921 20     Temp 12/23/19 1921 98.7 F (37.1 C)     Temp Source 12/23/19 1921 Oral     SpO2 12/23/19 1921 100 %     Weight 12/23/19 1922 230 lb (104.3 kg)     Height 12/23/19 1922 5\' 3"  (1.6 m)     Head Circumference --      Peak Flow --      Pain Score 12/23/19 1922 10     Pain Loc --      Pain Edu? --      Excl. in [place]? --      Physical Exam Vitals and nursing note reviewed.  Constitutional:      General: She is not in acute distress.    Appearance: She is well-developed.  HENT:     Head: Normocephalic and atraumatic.  Eyes:     Conjunctiva/sclera: Conjunctivae normal.  Cardiovascular:     Rate and Rhythm: Normal rate and regular rhythm.     Heart sounds: Normal heart sounds. No murmur. No friction rub.  Pulmonary:     Effort: Pulmonary effort is normal. No respiratory distress.     Breath sounds: Normal breath sounds. No wheezing or rales.  Abdominal:     General: There is no distension.     Palpations: Abdomen is soft.     Tenderness: There is abdominal tenderness (lower midline abdominal).  Genitourinary:    Comments: Cervical os closed. Moderate volume dark red blood in vaginal vault. Two approx 4 cm long clots evacuated with manual manipulation of uterus. Os remains closed.  Musculoskeletal:     Cervical back: Neck supple.  Skin:    General: Skin is  warm.     Capillary Refill: Capillary refill takes less than 2 seconds.  Neurological:     Mental Status: She is alert and oriented to person, place, and time.     Motor: No abnormal muscle tone.       ____________________________________________   LABS (all labs ordered are listed, but only abnormal results are displayed)  Labs Reviewed  CBC WITH DIFFERENTIAL/PLATELET - Abnormal; Notable for the following components:      Result Value   RBC  3.59 (*)    Hemoglobin 10.5 (*)    HCT 31.6 (*)    All other components within normal limits  HCG, QUANTITATIVE, PREGNANCY - Abnormal; Notable for the following components:   hCG, Beta Chain, Quant, S 6,062 (*)    All other components within normal limits  CBC - Abnormal; Notable for the following components:   WBC 11.1 (*)    RBC 3.27 (*)    Hemoglobin 9.6 (*)    HCT 27.9 (*)    All other components within normal limits  COMPREHENSIVE METABOLIC PANEL  ABO/RH    ____________________________________________  EKG: None ________________________________________  RADIOLOGY All imaging, including plain films, CT scans, and ultrasounds, independently reviewed by me, and interpretations confirmed via formal radiology reads.  ED MD interpretation:   [place]: Thickened endometrium with suspected hematoma, less likely retained POC  Official radiology report(s): US OB Comp Less 14 Wks  Result Date: 12/23/2019 CLINICAL DATA:  29 year old female with bleeding and pain status post recent dilatation and curettage, would have been around 12 weeks. EXAM: OBSTETRIC <14 WK ULTRASOUND TECHNIQUE: Transabdominal ultrasound was performed for evaluation of the gestation as well as the maternal uterus and adnexal regions. COMPARISON:  None relevant. FINDINGS: Intrauterine gestational sac: None. Maternal uterus/adnexae: Uterus: 14.4 x 6.8 x 7.0 cm, 359 mL volume. Myometrium appears within normal limits. Endometrium: Heterogeneous, up to 26 mm diameter (images 13  and 17) although no hypervascularity with color and power Doppler (images 14 and 18). Both ovaries appear normal. The right ovary is 4.3 by 2.0 x 2.4 cm (11 mL) and the left ovary is 4.2 x 2.2 x 2.3 cm (11 mL). Both ovaries demonstrate preserved color Doppler flow. Free fluid: None. IMPRESSION: 1. Expanded and heterogeneous endometrium, although absent hypervascularity argues against retained products of conception. Perhaps this is endometrial hematoma. 2. Normal ovaries.  No pelvic free fluid. Electronically Signed   By: Genevie Ann M.D.   On: 12/23/2019 20:39    ____________________________________________  PROCEDURES   Procedure(s) performed (including Critical Care):  Procedures  ____________________________________________  INITIAL IMPRESSION / MDM / [place] / ED COURSE  As part of my medical decision making, I reviewed the following data within the [facility] notes reviewed and incorporated, Old chart reviewed, Notes from prior ED visits, and [place] Controlled Substance Database       *Houston E Zertuche was evaluated in Emergency Department on 12/23/2019 for the symptoms described in the history of present illness. She was evaluated in the context of the global COVID-19 pandemic, which necessitated consideration that the patient might be at risk for infection with the SARS-CoV-2 virus that causes COVID-19. Institutional protocols and algorithms that pertain to the evaluation of patients at risk for COVID-19 are in a state of rapid change based on information released by regulatory bodies including the CDC and federal and state organizations. These policies and algorithms were followed during the patient's care in the ED.  Some ED evaluations and interventions may be delayed as a result of limited staffing during the pandemic.*  Clinical Course as of Dec 23 2331  Mon Dec 22, 3033  2431 29 year old female here with vaginal bleeding after recent D&C.   Differential includes retained products, uterine lining shedding, less likely cervical laceration given she had no bleeding immediately after, ultrasound is pending.   [CI]  2151 Imaging shows likely endometrial hematoma. Unlikely retained POC per Radiology. Repeat CBC does show downtrending Hgb to 9.6. Discussed with Dr. Glennon Mac of  OB - doubt retained POC and given that patient is stable, recommends vaginal cytotec at home with good follow-up. I had a long discussion with pt re: plan of care and given her hesitancy to administer at home as well as degree of pain and bleeding, will give dose here in ED and monitor.   [CI]  2206 Cytotec administered. Pt remains hemodynamically stable. Plan to monitor x 1-2 hr and recheck vitals, ambulate. If stable and not severely bleeding, likely safe for d/c. If unstable or significant bleeding, Dr. Glennon Mac is aware.   [CI]    Clinical Course User Index [CI] Duffy Bruce, MD    Medical Decision Making:  As above.  ____________________________________________  FINAL CLINICAL IMPRESSION(S) / ED DIAGNOSES  Final diagnoses:  Vaginal bleeding     MEDICATIONS GIVEN DURING THIS VISIT:  Medications  HYDROcodone-acetaminophen (NORCO/VICODIN) 5-325 MG per tablet 1 tablet (has no administration in time range)  HYDROmorphone (DILAUDID) injection 1 mg (1 mg Intravenous Given 12/23/19 2000)  ketorolac (TORADOL) 30 MG/ML injection 15 mg (15 mg Intravenous Given 12/23/19 2000)  ondansetron (ZOFRAN) injection 4 mg (4 mg Intravenous Given 12/23/19 2000)  misoprostol (CYTOTEC) tablet 600 mcg (600 mcg Vaginal Given 12/23/19 2134)  HYDROmorphone (DILAUDID) injection 1 mg (1 mg Intravenous Given 12/23/19 2223)     ED Discharge Orders         Ordered    HYDROcodone-acetaminophen (NORCO/VICODIN) 5-325 MG tablet  Every 6 hours PRN     12/23/19 2212    ondansetron (ZOFRAN ODT) 4 MG disintegrating tablet  Every 8 hours PRN     12/23/19 2212           Note:  This  document was prepared using Dragon voice recognition software and may include unintentional dictation errors.   Duffy Bruce, MD 12/23/19 [phone]

## 2019-12-23 NOTE — ED Notes (Signed)
PT taken to US

## 2019-12-23 NOTE — Discharge Instructions (Addendum)
The Cytotec given in the Er should help pass the clot in your uterus.  I would expect a moderate amount of cramping pain and bleeding for the next 24 hours.  If you are soaking through more than 1 pad every hour or have bright red bleeding, lightheadedness, dizziness, or other concerning symptoms, return to the ER immediately

## 2019-12-24 NOTE — ED Notes (Signed)
ambulated pt per MD.  PT tolerated well, did not get faint or dizzy.

## 2020-01-02 ENCOUNTER — Ambulatory Visit (INDEPENDENT_AMBULATORY_CARE_PROVIDER_SITE_OTHER): Payer: Managed Care, Other (non HMO) | Admitting: Obstetrics and Gynecology

## 2020-01-02 ENCOUNTER — Encounter: Payer: Self-pay | Admitting: Obstetrics and Gynecology

## 2020-01-02 ENCOUNTER — Other Ambulatory Visit: Payer: Self-pay

## 2020-01-02 VITALS — BP 124/78 | Ht 63.0 in | Wt 228.0 lb

## 2020-01-02 DIAGNOSIS — O036 Delayed or excessive hemorrhage following complete or unspecified spontaneous abortion: Secondary | ICD-10-CM | POA: Diagnosis not present

## 2020-01-02 DIAGNOSIS — Z332 Encounter for elective termination of pregnancy: Secondary | ICD-10-CM

## 2020-01-02 NOTE — Progress Notes (Signed)
Obstetrics & Gynecology Office Visit   Chief Complaint  Patient presents with  . Follow-up  ER Follow up for heavy bleeding following elective pregnancy termination.   History of Present Illness:  29 y.o. [id number] female who presents in follow-up from an ER visit on June 7.  She was noted to have what appeared to be a large clot in her uterus with no evidence of retained products of conception.  Today she states that she is doing better.  Her bleeding stopped yesterday and she has no bleeding today.  She denies fevers, chills, abdominal pain, and any other symptoms.  She originally presented for contraception counseling with desire for bilateral tubal ligation.  This was discussed again in some detail and the patient would like to consider.  Past Medical History:  Diagnosis Date  . Anemia   . Bacterial vaginosis 11/11/2015   History of  . Chlamydia   . Chronic kidney disease    h/o kidney stones  . Hyperlipidemia   . Irregular menses   . Oligomenorrhea   . PCOS (polycystic ovarian syndrome)   . PCOS (polycystic ovarian syndrome)   . Vaccine for human papilloma virus (HPV) types 6, 11, 16, and 18 administered   . Vitamin D deficiency   . Vitamin D deficiency     Past Surgical History:  Procedure Laterality Date  . CESAREAN SECTION    . HYSTEROSCOPY WITH D & C N/A 10/20/2015   Procedure: DILATATION AND CURETTAGE /HYSTEROSCOPY;  Surgeon: Malachy Mood, MD;  Location: [facility] ORS;  Service: Gynecology;  Laterality: N/A;    Gynecologic History: Patient's last menstrual period was 10/10/2019.  Obstetric History: [id number]  Family History  Problem Relation Age of Onset  . Diabetes Mother        type II  . Hyperlipidemia Mother   . Hypertension Mother   . GER disease Mother   . Sleep apnea Mother   . Hypertension Father   . Hyperlipidemia Maternal Grandmother   . Diabetes Maternal Grandmother        Type II  . Cancer Maternal Grandfather        Lung cancer  . Hypertension  Paternal Grandmother   . Brain cancer Paternal Grandmother   . Lung cancer Paternal Grandmother   . Cancer Paternal Grandfather   . Diabetes Paternal Grandfather   . Hyperlipidemia Paternal Grandfather   . Cataracts Paternal Grandfather     Social History   Socioeconomic History  . Marital status: Single    Spouse name: Not on file  . Number of children: Not on file  . Years of education: Not on file  . Highest education level: Not on file  Occupational History  . Not on file  Tobacco Use  . Smoking status: Former Smoker    Packs/day: 0.25    Years: 8.00    Pack years: 2.00    Types: Cigarettes  . Smokeless tobacco: Never Used  . Tobacco comment: quit 2019  Vaping Use  . Vaping Use: Never used  Substance and Sexual Activity  . Alcohol use: No  . Drug use: No  . Sexual activity: Yes    Birth control/protection: None  Other Topics Concern  . Not on file  Social History Narrative  . Not on file   Social Determinants of Health   Financial Resource Strain:   . Difficulty of Paying Living Expenses:   Food Insecurity:   . Worried About Charity fundraiser in the Last Year:   .  Ran Out of Food in the Last Year:   Transportation Needs:   . Film/video editor (Medical):   Marland Kitchen Lack of Transportation (Non-Medical):   Physical Activity:   . Days of Exercise per Week:   . Minutes of Exercise per Session:   Stress:   . Feeling of Stress :   Social Connections:   . Frequency of Communication with Friends and Family:   . Frequency of Social Gatherings with Friends and Family:   . Attends Religious Services:   . Active Member of Clubs or Organizations:   . Attends Archivist Meetings:   Marland Kitchen Marital Status:   Intimate Partner Violence:   . Fear of Current or Ex-Partner:   . Emotionally Abused:   Marland Kitchen Physically Abused:   . Sexually Abused:     Allergies  Allergen Reactions  . Augmentin [Amoxicillin-Pot Clavulanate] Nausea And Vomiting    Has patient had a  PCN reaction causing immediate rash, facial/tongue/throat swelling, SOB or lightheadedness with hypotension: no Has patient had a PCN reaction causing severe rash involving mucus membranes or skin necrosis: unknown Has patient had a PCN reaction that required hospitalization no Has patient had a PCN reaction occurring within the last 10 years: yes If all of the above answers are "NO", then may proceed with Cephalosporin use.     Prior to Admission medications   Medication Sig Start Date End Date Taking? Authorizing Provider  HYDROcodone-acetaminophen (NORCO/VICODIN) 5-325 MG tablet Take 1-2 tablets by mouth every 6 (six) hours as needed for moderate pain or severe pain. Patient not taking: Reported on 01/02/2020 12/23/19 12/22/20  Duffy Bruce, MD  hydrocortisone 2.5 % cream Apply 1 application topically 2 (two) times daily. Patient not taking: Reported on 01/02/2020 01/15/19   [provider]  ondansetron (ZOFRAN ODT) 4 MG disintegrating tablet Take 1 tablet (4 mg total) by mouth every 8 (eight) hours as needed for nausea or vomiting. Patient not taking: Reported on 01/02/2020 12/23/19   Duffy Bruce, MD    Review of Systems  Constitutional: Negative.   HENT: Negative.   Eyes: Negative.   Respiratory: Negative.   Cardiovascular: Negative.   Gastrointestinal: Negative.   Genitourinary: Negative.   Musculoskeletal: Negative.   Skin: Negative.   Neurological: Negative.   Psychiatric/Behavioral: Negative.      Physical Exam BP 124/78   Ht 5\' 3"  (1.6 m)   Wt 228 lb (103.4 kg)   LMP 10/10/2019   BMI 40.39 kg/m  Patient's last menstrual period was 10/10/2019. Physical Exam Constitutional:      General: She is not in acute distress.    Appearance: Normal appearance. She is well-developed.  HENT:     Head: Normocephalic and atraumatic.  Eyes:     General: No scleral icterus.    Conjunctiva/sclera: Conjunctivae normal.  Cardiovascular:     Rate and Rhythm: Normal rate and  regular rhythm.     Heart sounds: No murmur heard.  No friction rub. No gallop.   Pulmonary:     Effort: Pulmonary effort is normal. No respiratory distress.     Breath sounds: Normal breath sounds. No wheezing or rales.  Abdominal:     General: Bowel sounds are normal. There is no distension.     Palpations: Abdomen is soft. There is no mass.     Tenderness: There is no abdominal tenderness. There is no guarding or rebound.  Musculoskeletal:        General: Normal range of motion.  Cervical back: Normal range of motion and neck supple.  Neurological:     General: No focal deficit present.     Mental Status: She is alert and oriented to person, place, and time.     Cranial Nerves: No cranial nerve deficit.  Skin:    General: Skin is warm and dry.     Findings: No erythema.  Psychiatric:        Mood and Affect: Mood normal.        Behavior: Behavior normal.        Judgment: Judgment normal.     Female chaperone present for pelvic and breast  portions of the physical exam  Assessment: 29 y.o. [id number] female here for  1. Elective abortion   2. Abortion, complete with hemorrhage      Plan: Problem List Items Addressed This Visit    None    Visit Diagnoses    Elective abortion    -  Primary   Abortion, complete with hemorrhage         At this point the patient seems to have improved.  I gave her bleeding precautions as well as infection precautions.  She was encouraged to check a pregnancy test at home in a couple of weeks to ensure that this is negative.  If the pregnancy test continues to be positive or she has heavy bleeding or signs of infection, she is to alert me immediately.  She voiced understanding to this.  She will further consider contraception as she does not desire pregnancy at this time.  If she desires a bilateral tubal ligation, she has previously been counseled on this.  She only has to call the office and let me know that she would like to move forward and  does not need an office appointment.  A total of 20 minutes were spent face-to-face with the patient as well as preparation, review, communication, and documentation during this encounter.    Prentice Docker, MD 01/02/2020 4:31 PM

## 2020-01-04 ENCOUNTER — Encounter: Payer: Self-pay | Admitting: Obstetrics and Gynecology

## 2020-07-15 ENCOUNTER — Encounter: Payer: Self-pay | Admitting: Emergency Medicine

## 2020-07-15 ENCOUNTER — Emergency Department
Admission: EM | Admit: 2020-07-15 | Discharge: 2020-07-15 | Disposition: A | Discharge status: Home / Self Care | Payer: Managed Care, Other (non HMO) | Attending: Emergency Medicine | Admitting: Emergency Medicine

## 2020-07-15 ENCOUNTER — Other Ambulatory Visit: Payer: Self-pay

## 2020-07-15 ENCOUNTER — Emergency Department: Payer: Managed Care, Other (non HMO)

## 2020-07-15 ENCOUNTER — Encounter (HOSPITAL_COMMUNITY): Payer: Self-pay

## 2020-07-15 DIAGNOSIS — R109 Unspecified abdominal pain: Secondary | ICD-10-CM | POA: Insufficient documentation

## 2020-07-15 DIAGNOSIS — O200 Threatened abortion: Secondary | ICD-10-CM | POA: Insufficient documentation

## 2020-07-15 DIAGNOSIS — Z5321 Procedure and treatment not carried out due to patient leaving prior to being seen by health care provider: Secondary | ICD-10-CM | POA: Insufficient documentation

## 2020-07-15 DIAGNOSIS — O209 Hemorrhage in early pregnancy, unspecified: Secondary | ICD-10-CM | POA: Insufficient documentation

## 2020-07-15 DIAGNOSIS — Z3A09 9 weeks gestation of pregnancy: Secondary | ICD-10-CM | POA: Insufficient documentation

## 2020-07-15 DIAGNOSIS — N189 Chronic kidney disease, unspecified: Secondary | ICD-10-CM | POA: Insufficient documentation

## 2020-07-15 DIAGNOSIS — O26891 Other specified pregnancy related conditions, first trimester: Secondary | ICD-10-CM | POA: Insufficient documentation

## 2020-07-15 DIAGNOSIS — Z3A08 8 weeks gestation of pregnancy: Secondary | ICD-10-CM | POA: Insufficient documentation

## 2020-07-15 DIAGNOSIS — Z87891 Personal history of nicotine dependence: Secondary | ICD-10-CM | POA: Insufficient documentation

## 2020-07-15 LAB — CBC WITH DIFFERENTIAL/PLATELET
Abs Immature Granulocytes: 0.04 10*3/uL (ref 0.00–0.07)
Basophils Absolute: 0 10*3/uL (ref 0.0–0.1)
Basophils Relative: 0 %
Eosinophils Absolute: 0.1 10*3/uL (ref 0.0–0.5)
Eosinophils Relative: 1 %
HCT: 36.9 % (ref 36.0–46.0)
Hemoglobin: 12.6 g/dL (ref 12.0–15.0)
Immature Granulocytes: 0 %
Lymphocytes Relative: 18 %
Lymphs Abs: 1.9 10*3/uL (ref 0.7–4.0)
MCH: 29.6 pg (ref 26.0–34.0)
MCHC: 34.1 g/dL (ref 30.0–36.0)
MCV: 86.8 fL (ref 80.0–100.0)
Monocytes Absolute: 0.6 10*3/uL (ref 0.1–1.0)
Monocytes Relative: 5 %
Neutro Abs: 8.3 10*3/uL — ABNORMAL HIGH (ref 1.7–7.7)
Neutrophils Relative %: 76 %
Platelets: 344 10*3/uL (ref 150–400)
RBC: 4.25 MIL/uL (ref 3.87–5.11)
RDW: 13.1 % (ref 11.5–15.5)
WBC: 11 10*3/uL — ABNORMAL HIGH (ref 4.0–10.5)
nRBC: 0 % (ref 0.0–0.2)

## 2020-07-15 LAB — ABO/RH: ABO/RH(D): O POS

## 2020-07-15 LAB — HCG, QUANTITATIVE, PREGNANCY: hCG, Beta Chain, Quant, S: 115463 m[IU]/mL — ABNORMAL HIGH (ref <5)

## 2020-07-15 NOTE — ED Triage Notes (Signed)
Pt comes into the ED via POV c/o vaginal bleeding that started today.  Pt believes that she is around [redacted] weeks pregnant.  Pt states she is not soaking through pads at this time.  Pt also states she has abdominal cramping.  Pt also has a h/o miscarriages in the past.  Pt currently in NAD at this time with even and unlabored respirations.

## 2020-07-15 NOTE — ED Triage Notes (Signed)
Pt reports small amount of vaginal bleeding today that does not require use of pads. Pt estimates [redacted] weeks pregnant. Blood work completed today at [facility].

## 2020-07-16 ENCOUNTER — Emergency Department (HOSPITAL_COMMUNITY): Payer: Managed Care, Other (non HMO)

## 2020-07-16 ENCOUNTER — Emergency Department (HOSPITAL_COMMUNITY)
Admission: EM | Admit: 2020-07-16 | Discharge: 2020-07-16 | Disposition: A | Discharge status: Home / Self Care | Payer: Managed Care, Other (non HMO) | Attending: Emergency Medicine | Admitting: Emergency Medicine

## 2020-07-16 DIAGNOSIS — O200 Threatened abortion: Secondary | ICD-10-CM

## 2020-07-16 LAB — URINALYSIS, ROUTINE W REFLEX MICROSCOPIC
Bilirubin Urine: NEGATIVE
Glucose, UA: NEGATIVE mg/dL
Ketones, ur: NEGATIVE mg/dL
Leukocytes,Ua: NEGATIVE
Nitrite: NEGATIVE
Protein, ur: NEGATIVE mg/dL
Specific Gravity, Urine: 1.028 (ref 1.005–1.030)
pH: 5 (ref 5.0–8.0)

## 2020-07-16 LAB — GC/CHLAMYDIA PROBE AMP (~~LOC~~) NOT AT ARMC
Chlamydia: NEGATIVE
Comment: NEGATIVE
Comment: NORMAL
Neisseria Gonorrhea: NEGATIVE

## 2020-07-16 LAB — WET PREP, GENITAL
Clue Cells Wet Prep HPF POC: NONE SEEN
Sperm: NONE SEEN
Trich, Wet Prep: NONE SEEN
WBC, Wet Prep HPF POC: NONE SEEN
Yeast Wet Prep HPF POC: NONE SEEN

## 2020-07-16 MED ORDER — PRENATAL 27-0.8 MG PO TABS: 1.0000 | ORAL_TABLET | Freq: Every day | ORAL | 0 refills | Status: DC

## 2020-07-16 NOTE — Discharge Instructions (Signed)
Your ultrasound shows an intrauterine pregnancy of 9 weeks and 1 day. Anytime there is bleeding in pregnancy it is considered a threatened miscarriage, but can also be a normal part of the pregnancy.   Please follow up with OB of your choice for further prenatal care.

## 2020-07-16 NOTE — ED Provider Notes (Signed)
[facility]-EMERGENCY DEPT Provider Note   CSN: [id number] Arrival date & time: 07/15/20  2200     History Chief Complaint  Patient presents with  . Vaginal Bleeding    Susan Hubbard is a 29 y.o. female.  Patient to ED with concern for vaginal bleeding in early pregnancy, estimated to be about [redacted] weeks pregnant now. [id number]. She noted light vaginal bleeding earlier yesterday without clots or other visualized tissue. She reports pelvic cramping "like a period". She has had 2 miscarriages, both in early pregnancy. No fever. No urinary symptoms. No vaginal discharge.   The history is provided by the patient. No language interpreter was used.  Vaginal Bleeding Associated symptoms: no abdominal pain, no back pain, no fever and no vaginal discharge        Past Medical History:  Diagnosis Date  . Anemia   . Bacterial vaginosis 11/11/2015   History of  . Chlamydia   . Chronic kidney disease    h/o kidney stones  . Hyperlipidemia   . Irregular menses   . Oligomenorrhea   . PCOS (polycystic ovarian syndrome)   . PCOS (polycystic ovarian syndrome)   . Vaccine for human papilloma virus (HPV) types 6, 11, 16, and 18 administered   . Vitamin D deficiency   . Vitamin D deficiency     Patient Active Problem List   Diagnosis Date Noted  . Dysmenorrhea 10/29/2019  . Right hand pain 09/20/2016  . Bacterial vaginosis 11/11/2015  . Placental site nodule 11/11/2015  . Abortion 05/18/2015  . Vitamin D deficiency 02/05/2015  . Obesity 02/05/2015  . Pure hypercholesterolemia 02/05/2015  . Kidney stones 02/05/2015  . Sleep disturbance 02/05/2015  . Acne 02/05/2015  . Irregular menses 02/05/2015  . Tobacco abuse 02/05/2015  . Eczema 02/05/2015  . History of PCOS 02/05/2015    Past Surgical History:  Procedure Laterality Date  . CESAREAN SECTION    . HYSTEROSCOPY WITH D & C N/A 10/20/2015   Procedure: DILATATION AND CURETTAGE /HYSTEROSCOPY;  Surgeon: Vena Austria, MD;  Location: [facility] ORS;  Service: Gynecology;  Laterality: N/A;     OB History    Gravida  5   Para  1   Term  1   Preterm      AB  3   Living  1     SAB  1   IAB  2   Ectopic      Multiple      Live Births  1           Family History  Problem Relation Age of Onset  . Diabetes Mother        type II  . Hyperlipidemia Mother   . Hypertension Mother   . GER disease Mother   . Sleep apnea Mother   . Hypertension Father   . Hyperlipidemia Maternal Grandmother   . Diabetes Maternal Grandmother        Type II  . Cancer Maternal Grandfather        Lung cancer  . Hypertension Paternal Grandmother   . Brain cancer Paternal Grandmother   . Lung cancer Paternal Grandmother   . Cancer Paternal Grandfather   . Diabetes Paternal Grandfather   . Hyperlipidemia Paternal Grandfather   . Cataracts Paternal Grandfather     Social History   Tobacco Use  . Smoking status: Former Smoker    Packs/day: 0.25    Years: 8.00    Pack years: 2.00  Types: Cigarettes  . Smokeless tobacco: Never Used  . Tobacco comment: quit 2019  Vaping Use  . Vaping Use: Never used  Substance Use Topics  . Alcohol use: No  . Drug use: No    Home Medications Prior to Admission medications   Medication Sig Start Date End Date Taking? Authorizing Provider  HYDROcodone-acetaminophen (NORCO/VICODIN) 5-325 MG tablet Take 1-2 tablets by mouth every 6 (six) hours as needed for moderate pain or severe pain. Patient not taking: Reported on 01/02/2020 12/23/19 12/22/20  Duffy Bruce, MD  hydrocortisone 2.5 % cream Apply 1 application topically 2 (two) times daily. Patient not taking: Reported on 01/02/2020 01/15/19   [provider]  ondansetron (ZOFRAN ODT) 4 MG disintegrating tablet Take 1 tablet (4 mg total) by mouth every 8 (eight) hours as needed for nausea or vomiting. Patient not taking: Reported on 01/02/2020 12/23/19   Duffy Bruce, MD    Allergies    Augmentin  [amoxicillin-pot clavulanate]  Review of Systems   Review of Systems  Constitutional: Negative for fever.  Gastrointestinal: Negative for abdominal pain.  Genitourinary: Positive for pelvic pain and vaginal bleeding. Negative for vaginal discharge.  Musculoskeletal: Negative for back pain.  Neurological: Negative for weakness.    Physical Exam Updated Vital Signs BP 122/71   Pulse 70   Temp 98.1 F (36.7 C)   Resp (!) 24   Ht 5\' 3"  (1.6 m)   Wt 96.2 kg   LMP 05/08/2020   SpO2 100%   BMI 37.55 kg/m   Physical Exam Vitals and nursing note reviewed.  Constitutional:      Appearance: She is well-developed and well-nourished.  HENT:     Head: Normocephalic.  Cardiovascular:     Rate and Rhythm: Normal rate.  Pulmonary:     Effort: Pulmonary effort is normal.  Abdominal:     General: Bowel sounds are normal.     Palpations: Abdomen is soft.     Tenderness: There is no abdominal tenderness. There is no guarding or rebound.  Genitourinary:    General: Normal vulva.     Comments: Mild pelvic tenderness throughout on bimanual exam. There is a white, thick discharge in the vaginal vault. No cervical bleeding.  Musculoskeletal:        General: Normal range of motion.     Cervical back: Normal range of motion and neck supple.  Skin:    General: Skin is warm and dry.     Findings: No rash.  Neurological:     Mental Status: She is alert and oriented to person, place, and time.  Psychiatric:        Mood and Affect: Mood and affect normal.     ED Results / Procedures / Treatments   Labs (all labs ordered are listed, but only abnormal results are displayed) Labs Reviewed  URINALYSIS, ROUTINE W REFLEX MICROSCOPIC - Abnormal; Notable for the following components:      Result Value   APPearance HAZY (*)    Hgb urine dipstick SMALL (*)    Bacteria, UA RARE (*)    All other components within normal limits  WET PREP, GENITAL  GC/CHLAMYDIA PROBE AMP ([place]) NOT AT [facility]    Results for orders placed or performed during the hospital encounter of 07/16/20  Wet prep, genital   Specimen: Urine, Clean Catch  Result Value Ref Range   Yeast Wet Prep HPF POC NONE SEEN NONE SEEN   Trich, Wet Prep NONE SEEN NONE SEEN  Clue Cells Wet Prep HPF POC NONE SEEN NONE SEEN   WBC, Wet Prep HPF POC NONE SEEN NONE SEEN   Sperm NONE SEEN   Urinalysis, Routine w reflex microscopic Urine, Clean Catch  Result Value Ref Range   Color, Urine YELLOW YELLOW   APPearance HAZY (A) CLEAR   Specific Gravity, Urine 1.028 1.005 - 1.030   pH 5.0 5.0 - 8.0   Glucose, UA NEGATIVE NEGATIVE mg/dL   Hgb urine dipstick SMALL (A) NEGATIVE   Bilirubin Urine NEGATIVE NEGATIVE   Ketones, ur NEGATIVE NEGATIVE mg/dL   Protein, ur NEGATIVE NEGATIVE mg/dL   Nitrite NEGATIVE NEGATIVE   Leukocytes,Ua NEGATIVE NEGATIVE   RBC / HPF 0-5 0 - 5 RBC/hpf   WBC, UA 0-5 0 - 5 WBC/hpf   Bacteria, UA RARE (A) NONE SEEN   Squamous Epithelial / LPF 0-5 0 - 5   Mucus PRESENT     EKG None  Radiology US OB Comp Less 14 Wks  Result Date: 07/16/2020 CLINICAL DATA:  Pregnant, vaginal bleeding, pelvic pain, LMP 05/08/2020 EXAM: OBSTETRIC <14 WK ULTRASOUND TECHNIQUE: Transabdominal ultrasound was performed for evaluation of the gestation as well as the maternal uterus and adnexal regions. COMPARISON:  None. FINDINGS: Intrauterine gestational sac: Present, single Yolk sac:  Present, single, normal-appearing Embryo:  Present, single Cardiac Activity: Present Heart Rate: 167 bpm MSD: Appropriate given fetal size CRL:   24 mm   9 w 1 d                  [place] EDC: 02/17/2021 Subchorionic hemorrhage: Small subchorionic hemorrhage is noted both within the uterine fundus as well as within the lower uterine segment. Maternal uterus/adnexae: The uterus is anteverted. No intrauterine masses. The cervix is not well visualized on this examination, but appears closed, best seen on image # 8. No free fluid within the cul-de-sac. The  maternal ovaries are unremarkable. IMPRESSION: Single living intrauterine gestation with an estimated gestational age of [redacted] weeks, 1 day. Small subchorionic hemorrhage. Electronically Signed   By: Fidela Salisbury MD   On: 07/16/2020 03:50    Procedures Procedures (including critical care time)  Medications Ordered in ED Medications - No data to display  ED Course  I have reviewed the triage vital signs and the nursing notes.  Pertinent labs & imaging results that were available during my care of the patient were reviewed by me and considered in my medical decision making (see chart for details).    MDM Rules/Calculators/A&P                          Patient to eD with vaginal bleeding in early pregnancy. History of miscarriage x 2.   No bleeding visualized on pelvic exam. Quant >115000. US performed and confirms single living IUP of [redacted]w[redacted]d. Small chorionic hemorrhage.  VSS. She can be discharged home to follow up with OB/GYN for further care. Prenatal vitamins provided.   Final Clinical Impression(s) / ED Diagnoses Final diagnoses:  None   1. Threatened miscarriage. 2. [redacted]w[redacted]d IUP  Rx / DC Orders ED Discharge Orders    None       Charlann Lange, PA-C 07/16/20 0410    Fatima Blank, MD 07/16/20 [phone]

## 2020-08-05 ENCOUNTER — Other Ambulatory Visit: Payer: Self-pay

## 2020-08-05 ENCOUNTER — Encounter: Payer: Self-pay | Admitting: Advanced Practice Midwife

## 2020-08-05 ENCOUNTER — Ambulatory Visit (INDEPENDENT_AMBULATORY_CARE_PROVIDER_SITE_OTHER): Payer: BLUE CROSS/BLUE SHIELD | Admitting: Advanced Practice Midwife

## 2020-08-05 ENCOUNTER — Other Ambulatory Visit (HOSPITAL_COMMUNITY)
Admission: RE | Admit: 2020-08-05 | Discharge: 2020-08-05 | Disposition: A | Discharge status: Home / Self Care | Payer: BLUE CROSS/BLUE SHIELD | Source: Ambulatory Visit | Attending: Advanced Practice Midwife | Admitting: Advanced Practice Midwife

## 2020-08-05 VITALS — BP 126/84 | Wt 224.0 lb

## 2020-08-05 DIAGNOSIS — O0991 Supervision of high risk pregnancy, unspecified, first trimester: Secondary | ICD-10-CM | POA: Diagnosis present

## 2020-08-05 DIAGNOSIS — Z3A12 12 weeks gestation of pregnancy: Secondary | ICD-10-CM

## 2020-08-05 DIAGNOSIS — Z113 Encounter for screening for infections with a predominantly sexual mode of transmission: Secondary | ICD-10-CM

## 2020-08-05 DIAGNOSIS — Z1379 Encounter for other screening for genetic and chromosomal anomalies: Secondary | ICD-10-CM

## 2020-08-05 DIAGNOSIS — O0993 Supervision of high risk pregnancy, unspecified, third trimester: Secondary | ICD-10-CM | POA: Insufficient documentation

## 2020-08-05 DIAGNOSIS — O99211 Obesity complicating pregnancy, first trimester: Secondary | ICD-10-CM | POA: Insufficient documentation

## 2020-08-05 NOTE — Progress Notes (Signed)
NOB today. LMP: 05/08/2020

## 2020-08-05 NOTE — Progress Notes (Signed)
New Obstetric Patient H&P    Chief Complaint: "Desires prenatal care"   History of Present Illness: Patient is a 30 y.o. [id number] Not Hispanic or Latino female, presents with amenorrhea and positive home pregnancy test. Patient's last menstrual period was 05/08/2020 (exact date). and based on her  LMP, her EDD is Estimated Date of Delivery: 02/12/21 and her EGA is [redacted]w[redacted]d. Cycles are 5 days, regular, and occur approximately every : 28 days. Her last pap smear was 2 years ago and was no abnormalities.    She had a urine pregnancy test which was positive 2 month(s)  ago. Her last menstrual period was normal and lasted for  5 day(s). Since her LMP she claims she has experienced breast tenderness, fatigue, nausea, some vomiting. She denies vaginal bleeding. Her past medical history is contributory for placental site nodule in 2017. Her prior pregnancies are notable for G1 2009 TAB, G2 2015 FT C/S FHR indications, G3 2017 TAB, G4 2021 SAB  Since her LMP, she admits to the use of tobacco products  no She claims she has gained 20 pounds since the start of her pregnancy.  There are cats in the home in the home  no  She admits close contact with children on a regular basis  yes  She has had chicken pox in the past yes She has had Tuberculosis exposures, symptoms, or previously tested positive for TB   no Current or past history of domestic violence. no  Genetic Screening/Teratology Counseling: (Includes patient, baby's father, or anyone in either family with:)   57. Patient's age >/= 57 at [facility]  no 2. Thalassemia ([place], [place], [place], or Asian background): MCV<80  no 3. Neural tube defect (meningomyelocele, spina bifida, anencephaly)  no 4. Congenital heart defect  no  5. Down syndrome  no 6. Tay-Sachs (Jewish, [place])  no 7. Canavan's Disease  no 8. Sickle cell disease or trait (African)  no  9. Hemophilia or other blood disorders  no  10. Muscular dystrophy  no  11. Cystic  fibrosis  no  12. Huntington's Chorea  no  13. Mental retardation/autism  no 14. Other inherited genetic or chromosomal disorder  no 15. Maternal metabolic disorder (DM, PKU, etc)  no 16. Patient or FOB with a child with a birth defect not listed above no  16a. Patient or FOB with a birth defect themselves no 17. Recurrent pregnancy loss, or stillbirth  no  18. Any medications since LMP other than prenatal vitamins (include vitamins, supplements, OTC meds, drugs, alcohol)  no 19. Any other genetic/environmental exposure to discuss  no  Infection History:   1. Lives with someone with TB or TB exposed  no  2. Patient or partner has history of genital herpes  no 3. Rash or viral illness since LMP  no 4. History of STI (GC, CT, HPV, syphilis, HIV)  Yes CT 5. History of recent travel :  no  Other pertinent information:  no     Review of Systems:10 point review of systems negative unless otherwise noted in HPI  Past Medical History:  Patient Active Problem List   Diagnosis Date Noted  . Supervision of high risk pregnancy in first trimester 08/05/2020    Clinic [facility] Prenatal Labs  Dating  Blood type: --/--/O POS Performed at [facility], [address] [place], [place] [address]  [phone])   Genetic Screen 1 Screen:    AFP:     Quad:     NIPS:  Antibody:   Anatomic [place]  Rubella:    Varicella: @VZVIGG @  GTT Early: hgb a1c               Third trimester:  RPR:     Rhogam  HBsAg:     Vaccines TDAP:                       Flu Shot: Covid: HIV:     Baby Food Breast                               GBS:   GC/CT:  Contraception  Pap: November 2019 negative  CBB     CS/VBAC    Support Person Phillips Odor       . Obesity affecting pregnancy in first trimester 08/05/2020  . Vitamin D deficiency 02/05/2015  . Obesity 02/05/2015  . Pure hypercholesterolemia 02/05/2015  . Kidney stones 02/05/2015  . Sleep disturbance 02/05/2015  . Acne 02/05/2015  . Eczema 02/05/2015  .  History of PCOS 02/05/2015    Past Surgical History:  Past Surgical History:  Procedure Laterality Date  . CESAREAN SECTION    . HYSTEROSCOPY WITH D & C N/A 10/20/2015   Procedure: DILATATION AND CURETTAGE /HYSTEROSCOPY;  Surgeon: Malachy Mood, MD;  Location: [facility] ORS;  Service: Gynecology;  Laterality: N/A;    Gynecologic History: Patient's last menstrual period was 05/08/2020 (exact date).  Obstetric History: [id number]  Family History:  Family History  Problem Relation Age of Onset  . Diabetes Mother        type II  . Hyperlipidemia Mother   . Hypertension Mother   . GER disease Mother   . Sleep apnea Mother   . Hypertension Father   . Hyperlipidemia Maternal Grandmother   . Diabetes Maternal Grandmother        Type II  . Cancer Maternal Grandfather        Lung cancer  . Hypertension Paternal Grandmother   . Brain cancer Paternal Grandmother   . Lung cancer Paternal Grandmother   . Cancer Paternal Grandfather   . Diabetes Paternal Grandfather   . Hyperlipidemia Paternal Grandfather   . Cataracts Paternal Grandfather     Social History:  Social History   Socioeconomic History  . Marital status: Single    Spouse name: Not on file  . Number of children: Not on file  . Years of education: Not on file  . Highest education level: Not on file  Occupational History  . Not on file  Tobacco Use  . Smoking status: Former Smoker    Packs/day: 0.25    Years: 8.00    Pack years: 2.00    Types: Cigarettes  . Smokeless tobacco: Never Used  . Tobacco comment: quit 2019  Vaping Use  . Vaping Use: Never used  Substance and Sexual Activity  . Alcohol use: No  . Drug use: No  . Sexual activity: Yes    Birth control/protection: None  Other Topics Concern  . Not on file  Social History Narrative  . Not on file   Social Determinants of Health   Financial Resource Strain: Not on file  Food Insecurity: Not on file  Transportation Needs: Not on file  Physical  Activity: Not on file  Stress: Not on file  Social Connections: Not on file  Intimate Partner Violence: Not on file    Allergies:  Allergies  Allergen Reactions  .  Augmentin [Amoxicillin-Pot Clavulanate] Nausea And Vomiting    Has patient had a PCN reaction causing immediate rash, facial/tongue/throat swelling, SOB or lightheadedness with hypotension: no Has patient had a PCN reaction causing severe rash involving mucus membranes or skin necrosis: unknown Has patient had a PCN reaction that required hospitalization no Has patient had a PCN reaction occurring within the last 10 years: yes If all of the above answers are "NO", then may proceed with Cephalosporin use.     Medications: Prior to Admission medications   Medication Sig Start Date End Date Taking? Authorizing Provider  Prenatal Vit-Fe Fumarate-FA (MULTIVITAMIN-PRENATAL) 27-0.8 MG TABS tablet Take 1 tablet by mouth daily at 12 noon. 07/16/20  Yes Upstill, Nehemiah Settle, PA-C  fluocinonide cream (LIDEX) 0.05 % Apply BID to affected areas on body PRN. Avoid face, groin, underarms 04/06/20   [provider]  ondansetron (ZOFRAN ODT) 4 MG disintegrating tablet Take 1 tablet (4 mg total) by mouth every 8 (eight) hours as needed for nausea or vomiting. Patient not taking: No sig reported 12/23/19   Duffy Bruce, MD    Physical Exam Vitals: Blood pressure 126/84, weight 224 lb (101.6 kg), last menstrual period 05/08/2020.  General: NAD HEENT: normocephalic, anicteric Thyroid: no enlargement, no palpable nodules Pulmonary: No increased work of breathing, CTAB Cardiovascular: RRR, distal pulses 2+ Abdomen: NABS, soft, non-tender, non-distended.  Umbilicus without lesions.  No hepatomegaly, splenomegaly or masses palpable. No evidence of hernia. FHTs by Doppler 150s/160s Genitourinary:  External: Normal external female genitalia.  Normal urethral meatus, normal Bartholin's and Skene's glands.    Vagina: Normal vaginal mucosa, no  evidence of prolapse.   Extremities: no edema, erythema, or tenderness Neurologic: Grossly intact Psychiatric: mood appropriate, affect full   The following were addressed during this visit:  Breastfeeding Education - Early initiation of breastfeeding    Comments: Keeps milk supply adequate, helps contract uterus and slow bleeding, and early milk is the perfect first food and is easy to digest.   - The importance of exclusive breastfeeding    Comments: Provides antibodies, Lower risk of breast and ovarian cancers, and type-2 diabetes,Helps your body recover, Reduced chance of SIDS.   - Risks of giving your baby anything other than breast milk if you are breastfeeding    Comments: Make the baby less content with breastfeeds, may make my baby more susceptible to illness, and may reduce my milk supply.   - The importance of early skin-to-skin contact    Comments: Keeps baby warm and secure, helps keep baby's blood sugar up and breathing steady, easier to bond and breastfeed, and helps calm baby.  - Rooming-in on a 24-hour basis    Comments: Easier to learn baby's feeding cues, easier to bond and get to know each other, and encourages milk production.   - Feeding on demand or baby-led feeding    Comments: Helps prevent breastfeeding complications, helps bring in good milk supply, prevents under or overfeeding, and helps baby feel content and satisfied   - Frequent feeding to help assure optimal milk production    Comments: Making a full supply of milk requires frequent removal of milk from breasts, infant will eat 8-12 times in 24 hours, if separated from infant use breast massage, hand expression and/ or pumping to remove milk from breasts.   - Effective positioning and attachment    Comments: Helps my baby to get enough breast milk, helps to produce an adequate milk supply, and helps prevent nipple pain and damage   -  Exclusive breastfeeding for the first 6 months    Comments:  Builds a healthy milk supply and keeps it up, protects baby from sickness and disease, and breastmilk has everything your baby needs for the first 6 months.  - Individualized Education    Comments: Contraindications to breastfeeding and other special medical conditions Patient has experience with breastfeeding    Assessment: 30 y.o. [id number] at [redacted]w[redacted]d presenting to initiate prenatal care  Plan: 1) Avoid alcoholic beverages. 2) Patient encouraged not to smoke.  3) Discontinue the use of all non-medicinal drugs and chemicals.  4) Take prenatal vitamins daily.  5) Nutrition, food safety (fish, cheese advisories, and high nitrite foods) and exercise discussed. 6) Hospital and practice style discussed with cross coverage system.  7) Genetic Screening, such as with 1st Trimester Screening, cell free fetal DNA, AFP testing, and Ultrasound, as well as with amniocentesis and CVS as appropriate, is discussed with patient. At the conclusion of today's visit patient is undecided genetic testing. She plans to call [employer] for coverage information. 8) Patient is asked about travel to areas at risk for the Zika virus, and counseled to avoid travel and exposure to mosquitoes or sexual partners who may have themselves been exposed to the virus. Testing is discussed, and will be ordered as appropriate.  9) Aptima, urine culture, NOB panel, sickle cell screen, Hgb A1C today 10) Return to clinic in 1 week for dating and rob- MaterniT 3 if desired   Rod Can, [facility] 08/05/2020, 11:56 AM

## 2020-08-05 NOTE — Patient Instructions (Signed)

## 2020-08-06 LAB — CERVICOVAGINAL ANCILLARY ONLY
Chlamydia: NEGATIVE
Comment: NEGATIVE
Comment: NEGATIVE
Comment: NORMAL
Neisseria Gonorrhea: NEGATIVE
Trichomonas: NEGATIVE

## 2020-08-07 LAB — RPR+RH+ABO+RUB AB+AB SCR+CB...
Antibody Screen: NEGATIVE
HIV Screen 4th Generation wRfx: NONREACTIVE
Hematocrit: 37.2 % (ref 34.0–46.6)
Hemoglobin: 12.7 g/dL (ref 11.1–15.9)
Hepatitis B Surface Ag: NEGATIVE
MCH: 29.4 pg (ref 26.6–33.0)
MCHC: 34.1 g/dL (ref 31.5–35.7)
MCV: 86 fL (ref 79–97)
Platelets: 358 10*3/uL (ref 150–450)
RBC: 4.32 x10E6/uL (ref 3.77–5.28)
RDW: 12.8 % (ref 11.7–15.4)
RPR Ser Ql: NONREACTIVE
Rh Factor: POSITIVE
Rubella Antibodies, IGG: 2.85 index (ref >0.99)
Varicella zoster IgG: 1143 index (ref >165)
WBC: 9.7 10*3/uL (ref 3.4–10.8)

## 2020-08-07 LAB — HGB FRACTIONATION CASCADE
Hgb A2: 2.4 % (ref 1.8–3.2)
Hgb A: 97.6 % (ref 96.4–98.8)
Hgb F: 0 % (ref 0.0–2.0)
Hgb S: 0 %

## 2020-08-07 LAB — URINE CULTURE

## 2020-08-07 LAB — HGB A1C W/O EAG: Hgb A1c MFr Bld: 5.3 % (ref 4.8–5.6)

## 2020-08-13 ENCOUNTER — Ambulatory Visit (INDEPENDENT_AMBULATORY_CARE_PROVIDER_SITE_OTHER): Payer: BLUE CROSS/BLUE SHIELD

## 2020-08-13 ENCOUNTER — Ambulatory Visit (INDEPENDENT_AMBULATORY_CARE_PROVIDER_SITE_OTHER): Payer: BLUE CROSS/BLUE SHIELD | Admitting: Obstetrics and Gynecology

## 2020-08-13 ENCOUNTER — Other Ambulatory Visit: Payer: Self-pay

## 2020-08-13 VITALS — BP 116/70 | Ht 63.0 in | Wt 224.0 lb

## 2020-08-13 DIAGNOSIS — O0991 Supervision of high risk pregnancy, unspecified, first trimester: Secondary | ICD-10-CM

## 2020-08-13 DIAGNOSIS — Z3A13 13 weeks gestation of pregnancy: Secondary | ICD-10-CM

## 2020-08-13 DIAGNOSIS — O219 Vomiting of pregnancy, unspecified: Secondary | ICD-10-CM

## 2020-08-13 LAB — POCT URINALYSIS DIPSTICK OB
Glucose, UA: NEGATIVE
POC,PROTEIN,UA: NEGATIVE

## 2020-08-13 MED ORDER — DOXYLAMINE-PYRIDOXINE 10-10 MG PO TBEC: 2.0000 | DELAYED_RELEASE_TABLET | Freq: Every day | ORAL | 5 refills | Status: DC

## 2020-08-13 NOTE — Patient Instructions (Signed)
For nausea (these may be purchased over-the-counter): -Vitamin B6 (pyridoxine):  25 mg three times each day (may buy 100 mg tablet and take twice per day or try to cut into 4 equal pieces and take 1 piece three times each day).  - doxylamine (found in [employer] and other sleep agents that can be bought in the store): take 12.5 - 25 mg at bedtime.  May take up to 25 mg three time each day.  However, keep in mind that this might make you sleepy.

## 2020-08-13 NOTE — Addendum Note (Signed)
Addended by: Vernia Buff A on: 08/13/2020 02:27 PM   Modules accepted: Orders

## 2020-08-13 NOTE — Progress Notes (Signed)
Routine Prenatal Care Visit  Subjective  Susan Hubbard is a 30 y.o. [id number] at [redacted]w[redacted]d being seen today for ongoing prenatal care.  She is currently monitored for the following issues for this high-risk pregnancy and has Vitamin D deficiency; Pure hypercholesterolemia; Kidney stones; Sleep disturbance; Acne; Eczema; History of PCOS; Supervision of high risk pregnancy in first trimester; and Obesity affecting pregnancy in first trimester on their problem list.  ----------------------------------------------------------------------------------- Patient reports daily nausea with occassional vomiting. Reports mid lower back pain. Associates it with lifting at work. Tried tylenol once no relief..    [name]. Bleeding: None.   . Denies leaking of fluid.  ----------------------------------------------------------------------------------- The following portions of the patient's history were reviewed and updated as appropriate: allergies, current medications, past family history, past medical history, past social history, past surgical history and problem list. Problem list updated.   Objective  Blood pressure 116/70, height 5\' 3"  (1.6 m), weight 224 lb (101.6 kg), last menstrual period 05/08/2020. Pregravid weight 216 lb (98 kg) Total Weight Gain 8 lb (3.629 kg) Urinalysis:      Fetal Status: Fetal Heart Rate (bpm): 150         [place]: Singleton intrauterine pregnancy is visualized with a CRL consistent with [redacted]w[redacted]d gestation, giving an (U/S) EDD of 02/14/2021. The (U/S) EDD is consistent with the clinically established EDD of 02/12/2021. FHR: 163 BPM CRL measurement: 74.6 mm Yolk sac is not visualized.  Amnion: visualized and appears normal  Right Ovary is normal in appearance. Left Ovary is normal appearance. Corpus luteal cyst:  Left ovary Survey of the adnexa demonstrates no adnexal masses. There is no free peritoneal fluid in the cul de sac.  General:  Alert, oriented and cooperative.  Patient is in no acute distress.  Skin: Skin is warm and dry. No rash noted.   Cardiovascular: Normal heart rate noted  Respiratory: Normal respiratory effort, no problems with respiration noted  Abdomen: Soft, gravid, appropriate for gestational age. Pain/Pressure: Present     Pelvic:  Cervical exam deferred        Extremities: Normal range of motion.     ental Status: Normal mood and affect. Normal behavior. Normal judgment and thought content.     Assessment   30 y.o. [id number] at [redacted]w[redacted]d by  02/12/2021, by Last Menstrual Period presenting for routine prenatal visit  Plan   pregnancy Problems (from 08/05/20 to present)    Problem Noted Resolved   Supervision of high risk pregnancy in first trimester 08/05/2020 by Rod Can, CNM No   Overview Addendum 08/13/2020 11:10 AM by Orlie Pollen, CNM     Nursing Staff Provider  Office Location  [facility] Dating   LMP = 13 wk [place]  Language  English Anatomy US    Flu Vaccine   Genetic Screen  NIPS:   declined  TDaP vaccine    Hgb A1C or  GTT A1C: 5.3 Third trimester :   Rhogam     LAB RESULTS   Feeding Plan  breast Blood Type O/Positive/-- (01/19 1148)   Contraception  Antibody Negative (01/19 1148)  Circumcision  Rubella 2.85 (01/19 1148)  Pediatrician   RPR Non Reactive (01/19 1148)   Support Person  Phillips Odor HBsAg Negative (01/19 1148)   Prenatal Classes  HIV Non Reactive (01/19 1148)    Varicella @varicellaresultconsole @   BTL Consent  GBS  (For PCN allergy, check sensitivities)        VBAC Consent  Pap      Hgb  Electro      CF      SMA               Previous Version       -Reviewed dating [place] results - c/w with EDD based on LMP -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    Return in about 4 weeks (around 09/10/2020) for ROB.  Orlie Pollen, CNM, MSN [facility], [facility] 08/13/2020, 11:15 AM

## 2020-09-10 ENCOUNTER — Ambulatory Visit (INDEPENDENT_AMBULATORY_CARE_PROVIDER_SITE_OTHER): Payer: BLUE CROSS/BLUE SHIELD | Admitting: Obstetrics and Gynecology

## 2020-09-10 ENCOUNTER — Other Ambulatory Visit: Payer: Self-pay

## 2020-09-10 VITALS — BP 130/72 | Ht 63.0 in | Wt 228.2 lb

## 2020-09-10 DIAGNOSIS — M549 Dorsalgia, unspecified: Secondary | ICD-10-CM

## 2020-09-10 DIAGNOSIS — Z3689 Encounter for other specified antenatal screening: Secondary | ICD-10-CM

## 2020-09-10 DIAGNOSIS — O0992 Supervision of high risk pregnancy, unspecified, second trimester: Secondary | ICD-10-CM

## 2020-09-10 DIAGNOSIS — L309 Dermatitis, unspecified: Secondary | ICD-10-CM

## 2020-09-10 DIAGNOSIS — O99891 Other specified diseases and conditions complicating pregnancy: Secondary | ICD-10-CM

## 2020-09-10 DIAGNOSIS — Z3A17 17 weeks gestation of pregnancy: Secondary | ICD-10-CM

## 2020-09-10 LAB — POCT URINALYSIS DIPSTICK OB
Glucose, UA: NEGATIVE
POC,PROTEIN,UA: NEGATIVE

## 2020-09-10 NOTE — Progress Notes (Signed)
Routine Prenatal Care Visit  Subjective  Susan Hubbard is a 30 y.o. [id number] at [redacted]w[redacted]d being seen today for ongoing prenatal care.  She is currently monitored for the following issues for this high-risk pregnancy and has Vitamin D deficiency; Pure hypercholesterolemia; Kidney stones; Sleep disturbance; Acne; Eczema; History of PCOS; Supervision of high risk pregnancy in first trimester; and Obesity affecting pregnancy in first trimester on their problem list.  ----------------------------------------------------------------------------------- Patient reports mid to low back pain that is persist at her current place of employment. Patient states she has to lift frequently at her job. Despite a previous work note, patient reports continuing to lift trays of varying weight. Patient additional reports eczema on arms and faces that is flairing up. Reports significant sx of itching on arms and face..    Susan Hubbard. Bleeding: None.  Movement: Present. Denies leaking of fluid.  ----------------------------------------------------------------------------------- The following portions of the patient's history were reviewed and updated as appropriate: allergies, current medications, past family history, past medical history, past social history, past surgical history and problem list. Problem list updated.   Objective  Blood pressure 130/72, height 5\' 3"  (1.6 m), weight 228 lb 3.2 oz (103.5 kg), last menstrual period 05/08/2020. Pregravid weight 216 lb (98 kg) Total Weight Gain 12 lb 3.2 oz (5.534 kg) Urinalysis:      Fetal Status: Fetal Heart Rate (bpm): 160   Movement: Present     General:  Alert, oriented and cooperative. Patient is in no acute distress.  Skin: Skin is warm and dry. No rash noted.   Cardiovascular: Normal heart rate noted  Respiratory: Normal respiratory effort, no problems with respiration noted  Abdomen: Soft, gravid, appropriate for gestational age. Pain/Pressure: Present      Pelvic:  Cervical exam deferred        Extremities: Normal range of motion.     ental Status: Normal mood and affect. Normal behavior. Normal judgment and thought content.     Assessment   30 y.o. [id number] at [redacted]w[redacted]d by  02/12/2021, by Last Menstrual Period presenting for routine prenatal visit  Plan   pregnancy Problems (from 08/05/20 to present)    Problem Noted Resolved   Supervision of high risk pregnancy in first trimester 08/05/2020 by Rod Can, CNM No   Overview Addendum 09/10/2020 10:42 AM by Orlie Pollen, CNM     Nursing Staff Provider  Office Location  [facility] Dating   LMP = 13 wk [place]  Language  English Anatomy US    Flu Vaccine   declined Genetic Screen  NIPS:   declined  TDaP vaccine    Hgb A1C or  GTT A1C: 5.3 Third trimester :   Rhogam   n/a   LAB RESULTS   Feeding Plan  breast Blood Type O/Positive/-- (01/19 1148)   Contraception  unsure Antibody Negative (01/19 1148)  Circumcision  Rubella 2.85 (01/19 1148)  Pediatrician   RPR Non Reactive (01/19 1148)   Support Person  Phillips Odor HBsAg Negative (01/19 1148)   Prenatal Classes  declined HIV Non Reactive (01/19 1148)    Varicella  immune  BTL Consent  n/a GBS  (For PCN allergy, check sensitivities)        VBAC Consent  desires Pap      Hgb Electro   normal adult hgb    CF      SMA               Previous Version      -Work note  provided -PT referral for back pain in pregnancy -Recommendations made for loratidine OTC for itching and hydrocortisone cream 0.5-1% OTC for eczema -Anatomy scan at next visit  Second trimester precautions including but not limited to vaginal bleeding, contractions, leaking of fluid and fetal movement were reviewed in detail with the patient.    Return in about 2 weeks (around 09/24/2020) for ROB with anatomy scan.  Orlie Pollen, CNM, MSN [facility], [facility] 09/10/2020, 10:42 AM

## 2020-09-18 ENCOUNTER — Telehealth: Payer: Self-pay

## 2020-09-18 NOTE — Telephone Encounter (Signed)
Pt calling; saw Derm and was rx medication; is it okay to use?  Also, her work needs a more detailed note.  [phone]  The medications rx'd are:  Fluocinolone, betamethasone oint, hydrocortizone 2.5%; to be used for 2wks.  As for the note, maybe something like allow her to sit for 8-10 hours; lifting restraints d/t back pain in pregnancy.  Adv will sent msg to [place].

## 2020-09-23 NOTE — Telephone Encounter (Signed)
KRV msgd pt 09/21/20.

## 2020-09-26 ENCOUNTER — Inpatient Hospital Stay (HOSPITAL_COMMUNITY)
Admission: AD | Admit: 2020-09-26 | Discharge: 2020-09-26 | Disposition: A | Discharge status: Home / Self Care | Payer: BLUE CROSS/BLUE SHIELD | Attending: Obstetrics and Gynecology | Admitting: Obstetrics and Gynecology

## 2020-09-26 ENCOUNTER — Encounter (HOSPITAL_COMMUNITY): Payer: Self-pay | Admitting: Obstetrics and Gynecology

## 2020-09-26 ENCOUNTER — Other Ambulatory Visit: Payer: Self-pay

## 2020-09-26 DIAGNOSIS — O26852 Spotting complicating pregnancy, second trimester: Secondary | ICD-10-CM | POA: Diagnosis not present

## 2020-09-26 DIAGNOSIS — Z3A20 20 weeks gestation of pregnancy: Secondary | ICD-10-CM | POA: Diagnosis not present

## 2020-09-26 DIAGNOSIS — O26892 Other specified pregnancy related conditions, second trimester: Secondary | ICD-10-CM | POA: Diagnosis not present

## 2020-09-26 DIAGNOSIS — M545 Low back pain, unspecified: Secondary | ICD-10-CM | POA: Diagnosis not present

## 2020-09-26 DIAGNOSIS — O26832 Pregnancy related renal disease, second trimester: Secondary | ICD-10-CM | POA: Diagnosis not present

## 2020-09-26 DIAGNOSIS — N189 Chronic kidney disease, unspecified: Secondary | ICD-10-CM | POA: Diagnosis not present

## 2020-09-26 DIAGNOSIS — N939 Abnormal uterine and vaginal bleeding, unspecified: Secondary | ICD-10-CM

## 2020-09-26 DIAGNOSIS — M549 Dorsalgia, unspecified: Secondary | ICD-10-CM | POA: Insufficient documentation

## 2020-09-26 DIAGNOSIS — O209 Hemorrhage in early pregnancy, unspecified: Secondary | ICD-10-CM | POA: Diagnosis not present

## 2020-09-26 DIAGNOSIS — R519 Headache, unspecified: Secondary | ICD-10-CM

## 2020-09-26 DIAGNOSIS — Z87891 Personal history of nicotine dependence: Secondary | ICD-10-CM | POA: Diagnosis not present

## 2020-09-26 DIAGNOSIS — G44209 Tension-type headache, unspecified, not intractable: Secondary | ICD-10-CM

## 2020-09-26 LAB — URINALYSIS, ROUTINE W REFLEX MICROSCOPIC
Bilirubin Urine: NEGATIVE
Glucose, UA: NEGATIVE mg/dL
Ketones, ur: NEGATIVE mg/dL
Leukocytes,Ua: NEGATIVE
Nitrite: NEGATIVE
Protein, ur: NEGATIVE mg/dL
Specific Gravity, Urine: 1.025 (ref 1.005–1.030)
pH: 5 (ref 5.0–8.0)

## 2020-09-26 LAB — WET PREP, GENITAL
Clue Cells Wet Prep HPF POC: NONE SEEN
Sperm: NONE SEEN
Trich, Wet Prep: NONE SEEN
Yeast Wet Prep HPF POC: NONE SEEN

## 2020-09-26 MED ORDER — CYCLOBENZAPRINE HCL 10 MG PO TABS: 10.0000 mg | ORAL_TABLET | Freq: Two times a day (BID) | ORAL | 0 refills | Status: DC | PRN

## 2020-09-26 MED ORDER — CYCLOBENZAPRINE HCL 5 MG PO TABS
10.0000 mg | ORAL_TABLET | Freq: Once | ORAL | Status: AC
  Administered 2020-09-26: 10 mg via ORAL
  Filled 2020-09-26: qty 2

## 2020-09-26 MED ORDER — ACETAMINOPHEN 500 MG PO TABS
1000.0000 mg | ORAL_TABLET | Freq: Once | ORAL | Status: AC
  Administered 2020-09-26: 1000 mg via ORAL
  Filled 2020-09-26: qty 2

## 2020-09-26 NOTE — MAU Provider Note (Signed)
History     CSN: [id number]  Arrival date and time: 09/26/20 6295   Event Date/Time   First Provider Initiated Contact with Patient 09/26/20 [phone]      Chief Complaint  Patient presents with  . Vaginal Bleeding  . Abdominal Pain   Susan Hubbard is a 30 y.o. [id number] at [redacted]w[redacted]d who receives care at [facility].  She presents today for Vaginal Bleeding and Abdominal Pain.  She states she was at work and noticed some spotting after wiping.  She reports this was around 12pm and she did not notice any spotting upon arrival.  She expresses concern because she has been experiencing abdominal cramping for the past 2-3 days. She describes the cramping as similar to a period and states it is not constant.  She denies trying any medication or interventions.  She reports she has not noticed any aggravating factors.  Patient also reports that she is not currently experiencing abdominal pain, but does have back pain.  She reports that the back pain is worsened by the abdominal pain.  She describes the back pain as a pressure, throbbing and rates it a 7/10.  Patient denies sexual activity in the past 3 days and endorses fetal movement.     OB History    Gravida  5   Para  1   Term  1   Preterm      AB  3   Living  1     SAB  1   IAB  2   Ectopic      Multiple      Live Births  1           Past Medical History:  Diagnosis Date  . Anemia   . Bacterial vaginosis 11/11/2015   History of  . Chlamydia   . Chronic kidney disease    h/o kidney stones  . Hyperlipidemia   . Irregular menses   . Oligomenorrhea   . PCOS (polycystic ovarian syndrome)   . PCOS (polycystic ovarian syndrome)   . Vaccine for human papilloma virus (HPV) types 6, 11, 16, and 18 administered   . Vitamin D deficiency   . Vitamin D deficiency     Past Surgical History:  Procedure Laterality Date  . CESAREAN SECTION    . HYSTEROSCOPY WITH D & C N/A 10/20/2015   Procedure: DILATATION AND CURETTAGE  /HYSTEROSCOPY;  Surgeon: Malachy Mood, MD;  Location: [facility] ORS;  Service: Gynecology;  Laterality: N/A;    Family History  Problem Relation Age of Onset  . Diabetes Mother        type II  . Hyperlipidemia Mother   . Hypertension Mother   . GER disease Mother   . Sleep apnea Mother   . Hypertension Father   . Hyperlipidemia Maternal Grandmother   . Diabetes Maternal Grandmother        Type II  . Cancer Maternal Grandfather        Lung cancer  . Hypertension Paternal Grandmother   . Brain cancer Paternal Grandmother   . Lung cancer Paternal Grandmother   . Cancer Paternal Grandfather   . Diabetes Paternal Grandfather   . Hyperlipidemia Paternal Grandfather   . Cataracts Paternal Grandfather     Social History   Tobacco Use  . Smoking status: Former Smoker    Packs/day: 0.25    Years: 8.00    Pack years: 2.00    Types: Cigarettes  . Smokeless tobacco: Never Used  .  Tobacco comment: quit 2019  Vaping Use  . Vaping Use: Never used  Substance Use Topics  . Alcohol use: No  . Drug use: No    Allergies:  Allergies  Allergen Reactions  . Augmentin [Amoxicillin-Pot Clavulanate] Nausea And Vomiting    Has patient had a PCN reaction causing immediate rash, facial/tongue/throat swelling, SOB or lightheadedness with hypotension: no Has patient had a PCN reaction causing severe rash involving mucus membranes or skin necrosis: unknown Has patient had a PCN reaction that required hospitalization no Has patient had a PCN reaction occurring within the last 10 years: yes If all of the above answers are "NO", then may proceed with Cephalosporin use.     Medications Prior to Admission  Medication Sig Dispense Refill Last Dose  . Prenatal Vit-Fe Fumarate-FA (MULTIVITAMIN-PRENATAL) 27-0.8 MG TABS tablet Take 1 tablet by mouth daily at 12 noon. 30 tablet 0 09/25/2020 at Unknown time  . Doxylamine-Pyridoxine (DICLEGIS) 10-10 MG TBEC Take 2 tablets by mouth at bedtime. If symptoms  persist, add one tablet in the morning and one in the afternoon (Patient not taking: Reported on 09/10/2020) 100 tablet 5   . fluocinonide cream (LIDEX) 0.05 % Apply BID to affected areas on body PRN. Avoid face, groin, underarms (Patient not taking: Reported on 09/10/2020)     . ondansetron (ZOFRAN ODT) 4 MG disintegrating tablet Take 1 tablet (4 mg total) by mouth every 8 (eight) hours as needed for nausea or vomiting. (Patient not taking: No sig reported) 8 tablet 0     Review of Systems  Constitutional: Negative for chills and fever.  Eyes: Negative for visual disturbance.  Respiratory: Negative for cough and shortness of breath.   Gastrointestinal: Positive for abdominal pain, constipation (Earlier today, but was "hard to come out."), nausea and vomiting.  Genitourinary: Positive for vaginal bleeding (Spotting). Negative for difficulty urinating, dysuria and vaginal discharge.  Musculoskeletal: Positive for back pain.  Neurological: Positive for headaches (5/10-Between eyes-pressure). Negative for dizziness and light-headedness.   Physical Exam   Blood pressure (!) 108/47, pulse 87, temperature 98.6 F (37 C), temperature source Oral, resp. rate (!) 24, height 5\' 3"  (1.6 m), weight 103.5 kg, last menstrual period 05/08/2020, SpO2 99 %.   Physical Exam Vitals reviewed.  Constitutional:      Appearance: She is well-developed. She is obese.  HENT:     Head: Normocephalic and atraumatic.  Cardiovascular:     Rate and Rhythm: Normal rate and regular rhythm.     Heart sounds: Normal heart sounds.  Pulmonary:     Effort: Pulmonary effort is normal. No respiratory distress.     Breath sounds: Normal breath sounds.  Abdominal:     General: Bowel sounds are normal.     Palpations: Abdomen is soft.     Tenderness: There is no abdominal tenderness.  Genitourinary:    Comments: Speculum Exam: -Normal External Genitalia: Non tender, no apparent discharge at introitus.  -Vaginal Vault: Pink  mucosa with good rugae. Moderate amt grayish watery discharge -wet prep collected -Cervix:Pink, no lesions, cysts, or polyps.  Appears closed. No active bleeding from os-GC/CT collected -Bimanual Exam: Unable to assess cervix d/t position.     Skin:    General: Skin is warm and dry.  Neurological:     Mental Status: She is alert.     MAU Course  Procedures Results for orders placed or performed during the hospital encounter of 09/26/20 (from the past 24 hour(s))  Urinalysis, Routine w reflex microscopic  Urine, Clean Catch     Status: Abnormal   Collection Time: 09/26/20  2:47 AM  Result Value Ref Range   Color, Urine YELLOW YELLOW   APPearance HAZY (A) CLEAR   Specific Gravity, Urine 1.025 1.005 - 1.030   pH 5.0 5.0 - 8.0   Glucose, UA NEGATIVE NEGATIVE mg/dL   Hgb urine dipstick MODERATE (A) NEGATIVE   Bilirubin Urine NEGATIVE NEGATIVE   Ketones, ur NEGATIVE NEGATIVE mg/dL   Protein, ur NEGATIVE NEGATIVE mg/dL   Nitrite NEGATIVE NEGATIVE   Leukocytes,Ua NEGATIVE NEGATIVE   RBC / HPF 0-5 0 - 5 RBC/hpf   WBC, UA 0-5 0 - 5 WBC/hpf   Bacteria, UA RARE (A) NONE SEEN   Squamous Epithelial / LPF 0-5 0 - 5   Mucus PRESENT    Hyaline Casts, UA PRESENT    Ca Oxalate Crys, UA PRESENT   Wet prep, genital     Status: Abnormal   Collection Time: 09/26/20  3:28 AM  Result Value Ref Range   Yeast Wet Prep HPF POC NONE SEEN NONE SEEN   Trich, Wet Prep NONE SEEN NONE SEEN   Clue Cells Wet Prep HPF POC NONE SEEN NONE SEEN   WBC, Wet Prep HPF POC MANY (A) NONE SEEN   Sperm NONE SEEN     MDM Pelvic Exam; Wet Prep and GC/CT Labs: UA, UC Muscle Relaxant Analgesic Assessment and Plan  30 year old [id number] SIUP at 20.1 weeks Vaginal Spotting Headache Abdominal Pain-Non Currently Back Pain  -POC Reviewed. -Exam performed and findings discussed. -Educated on how vaginal infections can cause spotting and abdominal cramping/discomfort. -Informed of UA and that it would be sent for  further evaluation. -Patient endorses a history of kidney stones. Encouraged to monitor symptoms. -Patient offered and accepts pain medication. -Reports she drove herself, but could get a ride if necessary.  -Will order tylenol and flexeril. -Will await results and reassess.   Maryann Conners 09/26/2020, 3:12 AM   Reassessment (4:14 AM)  -Patient reports improvement in back pain with flexeril dosing. -Patient offered and accepts prescription for home usage. -Rx for flexeril sent to pharmacy on file.  -Encouraged to take at night after work.  Also encouraged to obtain pregnancy support belt. -Patient requests and given list of pregnancy safe medications and OB providers in the community. -Informed of [facility] office in [facility]. -Patient offered and accepts note for work tonight. -Patient without further questions or concerns. -Encouraged to call or return to MAU if symptoms worsen or with the onset of new symptoms. -Discharged to home in stable condition.  Maryann Conners MSN, CNM Advanced Practice Provider, [facility] for [employer]

## 2020-09-26 NOTE — Discharge Instructions (Signed)
[place] for [employer] at [facility]  [address] [place], [place] [address]  [place] for [place] at [address], [place], [place] [address]  [place] for [place] at [facility] [address], [place], [place] [address]  [place] for [place] at [facility]  [address], [place], [place] [address]  [phone]  [place] for [place] at [facility] for [place] (First floor), [place], [place] [address]  [place] for [place] at [place], [place], [place] [address] [place] for [place] at [facility]  [place], [place], [place] [address]  [facility]  [address], [place], [place] [address]  [place]  [place], [place] [address]  [phone]  [facility]  [address] Jaclynn Guarneri [place], [place] [address]  [place] [address], [place], [place] [address]  [phone]  [facility]  [address], [place], [place] [address]  [phone]  [place]   [place], [place], [place] [address]  [phone]  [facility] [address], [place], [place] [address]   [phone]  [place] [facility]  [address], [place],  [address]  [phone]     Safe Medications in Pregnancy   Acne: Benzoyl Peroxide Salicylic Acid  Backache/Headache: Tylenol: 2 regular strength every 4 hours OR              2 Extra strength every 6 hours  Colds/Coughs/Allergies: Benadryl (alcohol free) 25 mg every 6 hours as needed Breath right strips Claritin Cepacol throat lozenges Chloraseptic throat spray Cold-Eeze- up to three times per day Cough drops, alcohol free Flonase (by  prescription only) Guaifenesin Mucinex Robitussin DM (plain only, alcohol free) Saline nasal spray/drops Sudafed (pseudoephedrine) & Actifed ** use only after [redacted] weeks gestation and if you do not have high blood pressure Tylenol Vicks Vaporub Zinc lozenges Zyrtec   Constipation: Colace Ducolax suppositories Fleet enema Glycerin suppositories Metamucil Milk of magnesia Miralax Senokot Smooth move tea  Diarrhea: Kaopectate Imodium A-D  *NO pepto Bismol  Hemorrhoids: Anusol Anusol HC Preparation H Tucks  Indigestion: Tums Maalox Mylanta Zantac  Pepcid  Insomnia: Benadryl (alcohol free) 25mg  every 6 hours as needed Tylenol PM Unisom, no Gelcaps  Leg Cramps: Tums MagGel  Nausea/Vomiting:  Bonine Dramamine Emetrol Ginger extract Sea bands Meclizine  Nausea medication to take during pregnancy:  Unisom (doxylamine succinate 25 mg tablets) Take one tablet daily at bedtime. If symptoms are not adequately controlled, the dose can be increased to a maximum recommended dose of two tablets daily (1/2 tablet in the morning, 1/2 tablet mid-afternoon and one at bedtime). Vitamin B6 100mg  tablets. Take one tablet twice a day (up to 200 mg per day).  Skin Rashes: Aveeno products Benadryl cream or 25mg  every 6 hours as needed Calamine Lotion 1% cortisone cream  Yeast infection: Gyne-lotrimin 7 Monistat 7  Gum/tooth pain: Anbesol  **If taking multiple medications, please check labels to avoid duplicating the same active ingredients **take medication as directed on the label ** Do not exceed 4000 mg of tylenol in 24 hours **Do not take medications that contain aspirin or ibuprofen

## 2020-09-26 NOTE — MAU Note (Signed)
..  Susan Hubbard is a 30 y.o. at [redacted]w[redacted]d here in MAU reporting: Vaginal spotting that she saw tonight when she wiped. And abdominal cramping that began 2-3 days ago. She also has a headache. Pain score: 5/10  FHT: doppler 156 Lab orders placed from triage: UA

## 2020-09-27 LAB — GC/CHLAMYDIA PROBE AMP (~~LOC~~) NOT AT ARMC
Chlamydia: NEGATIVE
Comment: NEGATIVE
Comment: NORMAL
Neisseria Gonorrhea: NEGATIVE

## 2020-09-27 LAB — URINE CULTURE

## 2020-09-29 ENCOUNTER — Ambulatory Visit (INDEPENDENT_AMBULATORY_CARE_PROVIDER_SITE_OTHER): Payer: BLUE CROSS/BLUE SHIELD | Admitting: Obstetrics

## 2020-09-29 ENCOUNTER — Ambulatory Visit (INDEPENDENT_AMBULATORY_CARE_PROVIDER_SITE_OTHER): Payer: BLUE CROSS/BLUE SHIELD

## 2020-09-29 ENCOUNTER — Other Ambulatory Visit: Payer: Self-pay

## 2020-09-29 VITALS — BP 120/80 | Wt 229.0 lb

## 2020-09-29 DIAGNOSIS — Z3689 Encounter for other specified antenatal screening: Secondary | ICD-10-CM | POA: Diagnosis not present

## 2020-09-29 DIAGNOSIS — O0992 Supervision of high risk pregnancy, unspecified, second trimester: Secondary | ICD-10-CM

## 2020-09-29 DIAGNOSIS — O0991 Supervision of high risk pregnancy, unspecified, first trimester: Secondary | ICD-10-CM

## 2020-09-29 DIAGNOSIS — Z3A20 20 weeks gestation of pregnancy: Secondary | ICD-10-CM

## 2020-09-29 NOTE — Progress Notes (Signed)
Patient seen for her anatomy ultrasound. Left the office due to need to get to work. Several attempts to reach her by phone unsuccessful ( the voicemail box is full). Her anatomy scan is incomplete due to inability to get full views. Will schedule her for another in 2 weeks with a ROB. FHTS WNL per the ultrasound. Imagene Riches, CNM  09/29/2020 11:59 AM

## 2020-10-02 ENCOUNTER — Other Ambulatory Visit: Payer: Self-pay | Admitting: Obstetrics

## 2020-10-02 DIAGNOSIS — O0992 Supervision of high risk pregnancy, unspecified, second trimester: Secondary | ICD-10-CM

## 2020-10-02 DIAGNOSIS — Z3A22 22 weeks gestation of pregnancy: Secondary | ICD-10-CM

## 2020-10-20 ENCOUNTER — Ambulatory Visit: Payer: BLUE CROSS/BLUE SHIELD | Attending: Obstetrics and Gynecology

## 2020-10-20 ENCOUNTER — Other Ambulatory Visit: Payer: Self-pay

## 2020-10-20 DIAGNOSIS — R293 Abnormal posture: Secondary | ICD-10-CM | POA: Diagnosis present

## 2020-10-20 DIAGNOSIS — M533 Sacrococcygeal disorders, not elsewhere classified: Secondary | ICD-10-CM | POA: Diagnosis present

## 2020-10-20 DIAGNOSIS — M62838 Other muscle spasm: Secondary | ICD-10-CM | POA: Diagnosis present

## 2020-10-20 NOTE — Patient Instructions (Signed)
   L c-curve scoliosis, possible leg-length difference, and poor posture.

## 2020-10-20 NOTE — Therapy (Signed)
[place] MAIN [facility] SERVICES [address] [place], [place], [address] Phone: [phone]   Fax:  [phone]  Physical Therapy Evaluation  The patient has been informed of current processes in place at Outpatient Rehab to protect patients from Covid-19 exposure including social distancing, schedule modifications, and new cleaning procedures. After discussing their particular risk with a therapist based on the patient's personal risk factors, the patient has decided to proceed with in-person therapy.  Patient Details  Name: Susan Hubbard MRN: [id number] Date of Birth: [birth year] No data recorded  Encounter Date: 10/20/2020   PT End of Session - 10/22/20 0801    Visit Number 1    Number of Visits 12    Date for PT Re-Evaluation 01/14/21    Authorization Type [employer]    Authorization Time Period 10/20/20 through 01/14/21    Authorization - Visit Number 1    Authorization - Number of Visits 12    Progress Note Due on Visit 10    PT Start Time 1230    PT Stop Time 1330    PT Time Calculation (min) 60 min    Activity Tolerance Patient tolerated treatment well    Behavior During Therapy [facility] for tasks assessed/performed           Past Medical History:  Diagnosis Date  . Anemia   . Bacterial vaginosis 11/11/2015   History of  . Chlamydia   . Chronic kidney disease    h/o kidney stones  . Hyperlipidemia   . Irregular menses   . Oligomenorrhea   . PCOS (polycystic ovarian syndrome)   . PCOS (polycystic ovarian syndrome)   . Vaccine for human papilloma virus (HPV) types 6, 11, 16, and 18 administered   . Vitamin D deficiency   . Vitamin D deficiency     Past Surgical History:  Procedure Laterality Date  . CESAREAN SECTION    . HYSTEROSCOPY WITH D & C N/A 10/20/2015   Procedure: DILATATION AND CURETTAGE /HYSTEROSCOPY;  Surgeon: Malachy Mood, MD;  Location: [facility] ORS;  Service: Gynecology;  Laterality: N/A;    There were no vitals filed  for this visit.    Pelvic Floor Physical Therapy Evaluation and Assessment  SCREENING  Falls in last 6 mo: yes, slipped on ice and landed on her forearms and hit her head.  Red Flags:  Have you had any night sweats? no Unexplained weight loss? no Saddle anesthesia? no Unexplained changes in bowel or bladder habits? no  SUBJECTIVE  Patient reports: She first started having pain after she had her first daughter but she was not working during her first pregnancy. She has to stand a lot for work now and when she stands for a long period of time it hurts and it will keep hurting even when she is sitting. She has seen a chiropractor before and it helped some. Has to stand for 8-12 hours. This was a little problem before her pregnancy but has gotten worse. She had to have 3 attempts at an epidural and then ended up having a c-section 6 years ago.   Precautions:  [redacted] weeks pregnant, [id number] h/o kidney stones,  Social/Family/Vocational History:   Working full time at the [employer].  Recent Procedures/Tests/Findings:  none  Obstetrical History: She has one live birth, has had an abortion and did have to have a D&C two years after her prior delivery due to excessive bleeding.   Gynecological History: H/o BV, chlamydia, PCOS, oligomenorrhea,  Urinary History: none  Gastrointestinal History: Has constipation now due to pregnancy, only going about every 3 days though she gets the urge more frequently but cannot get it to come out.   Sexual activity/pain: No pain.  Location of pain: B LB and hips into thighs Current pain:  2/10  Max pain:  9/10 Least pain:  2/10 Nature of pain: sharp, shooting, aching  Patient Goals: To have less pain so she can continue to work through her pregnancy with appropriate modifications.  OBJECTIVE  Posture/Observations:  Sitting: reaching for floor with toes. Standing: 5'3",  R handed. Anterior pelvic tilt. R PSIS appears  high  Palpation/Segmental Motion/Joint Play: TTP to B hip-flexors and LB extensors.  Special tests:   Supine-to-long-sit: RLE long in both. Scoliosis: slight L lumbar scoliosis  Range of Motion/Flexibilty:  Spine: R SB ~ to knee, L SB ~ 1.5 fingers past knee, L ROT ~ 50% reduced with pain in the L mid-back. Forward bend ~ 8 in. From floor. Hips:   Strength/MMT: deferred to follow-up LE MMT  LE MMT Left Right  Hip flex:  (L2) /5 /5  Hip ext: /5 /5  Hip abd: /5 /5  Hip add: /5 /5  Hip IR /5 /5  Hip ER /5 /5     Abdominal:  Palpation: TTP to R>L psoas, iliacus, and adductors Diastasis: 7 fingers at umbilicus  Pelvic Floor External Exam: Deferred to follow-up Introitus Appears:  Skin integrity:  Palpation: Cough: Prolapse visible?: Scar mobility:  Internal Vaginal Exam: Strength (PERF):  Symmetry: Palpation: Prolapse:   Internal Rectal Exam: Strength (PERF): Symmetry: Palpation: Prolapse:   Gait Analysis: slow, antalgic, wide BOS.   Pelvic Floor Outcome Measures: FOTO PFDI Pain: 38  INTERVENTIONS THIS SESSION: Self-care: Educated on the structure and function of the pelvic floor in relation to their symptoms as well as the POC, and initial HEP in order to set patient expectations and understanding from which we will build on in the future sessions. Educated on standing posture and bell-band as well as squatty-potty to decrease pressure on LB and improve ease with BM's to minimize straining.  Total time: 60 min.                   Objective measurements completed on examination: See above findings.                 PT Short Term Goals - 10/22/20 1914      PT SHORT TERM GOAL #1   Title Patient will demonstrate improved pelvic alignment and balance of musculature surrounding the pelvis to facilitate decreased PFM spasms and decrease pelvic pain.    Baseline RLE long Vs. obliquity, hyperkyphosis/lordosis, 7 finger DR.    Time 6     Period Weeks    Status New    Target Date 12/03/20      PT SHORT TERM GOAL #2   Title Patient will report a reduction in pain to no greater than 5/10 over the prior week to demonstrate symptom improvement.    Baseline 9/10 at worst    Time 6    Period Weeks    Status New    Target Date 12/03/20      PT SHORT TERM GOAL #3   Title Pt. will demonstrate implementation of behavioral modifications such as fluid intake and use of squatty-potty/ bowel retraining etc. to allow for decreased UUI/frequency.    Baseline constipation with occasional straining and every 2-3 days between [facility]  Time 6    Period Weeks    Status New    Target Date 12/03/20             PT Long Term Goals - 10/22/20 0811      PT LONG TERM GOAL #1   Title Patient will describe pain no greater than 3/10 during working a full shift with appropriate modifications to demonstrate improved functional ability.    Baseline 9/10 pain during full work day    Time 12    Period Weeks    Status New    Target Date 01/14/21      PT LONG TERM GOAL #2   Title Patient will be independent with HEP in order to return to maintain activity and occupation during pregnancy.    Baseline Pt. lacks knowledge of which therapeutic exercises will help decrease her Sx.    Time 12    Period Weeks    Status New    Target Date 12/03/20      PT LONG TERM GOAL #3   Title Pt. will improve in FOTO score by  points to demonstrate improved function.    Baseline FOTO PFDI Pain: 38    Time 12    Period Weeks    Status New    Target Date 01/14/21      PT LONG TERM GOAL #4   Title Patient will report having BM's at least every-other day with consistency between [facility] stool scale 3-5 and no straining over the prior week to demonstrate decreased constipation.    Baseline having a BM every 2-3 days with occasional straining.    Time 12    Period Weeks    Status New    Target Date 01/14/21                  Plan - 10/22/20  0803    Clinical Impression Statement Pt. is a 30 y/o female who presents today with cheif c/o LBP during pregnancy. Her PMH is significant for current 23 weeks pregnancy with history of prior miscarriage/abortion and 1 prior vaginal delivery as well aas h/o kidney stones. Her Clinical exam revealed a likely LLD with the RLE long, hyper kyphosis/lordosis, and a 7 finger diastasis recti as well as spasms surrounding the pelvis. She will benefit from skilled pelvic floor PT to address the noted deficits and to continue to assess for and address any other potential causes of Sx.    Personal Factors and Comorbidities Comorbidity 2    Comorbidities [redacted] weeks pregnant, [id number] h/o kidney stones    Examination-Activity Limitations Caring for Others;Locomotion Level;Stand    Examination-Participation Restrictions Occupation    Stability/Clinical Decision Making Evolving/Moderate complexity    Clinical Decision Making Moderate    Rehab Potential Good    PT Frequency 1x / week    PT Duration 12 weeks    PT Treatment/Interventions ADLs/Self Care Home Management;Electrical Stimulation;Gait training;Functional mobility training;Therapeutic activities;Therapeutic exercise;Stair training;Balance training;Neuromuscular re-education;Orthotic Fit/Training;Patient/family education;Manual techniques;Scar mobilization;Dry needling;Taping;Spinal Manipulations;Joint Manipulations    PT Next Visit Plan Posture education and squatty-potty    Consulted and Agree with Plan of Care Patient           Patient will benefit from skilled therapeutic intervention in order to improve the following deficits and impairments:  Decreased activity tolerance,Decreased strength,Hypermobility,Difficulty walking,Decreased coordination,Impaired flexibility,Obesity,Postural dysfunction,Pain  Visit Diagnosis: Sacrococcygeal disorders, not elsewhere classified  Abnormal posture  Other muscle spasm     Problem List Patient Active  Problem List  Diagnosis Date Noted  . Supervision of high risk pregnancy in first trimester 08/05/2020  . Obesity affecting pregnancy in first trimester 08/05/2020  . Vitamin D deficiency 02/05/2015  . Pure hypercholesterolemia 02/05/2015  . Kidney stones 02/05/2015  . Sleep disturbance 02/05/2015  . Acne 02/05/2015  . Eczema 02/05/2015  . History of PCOS 02/05/2015   Willa Rough DPT, ATC Willa Rough 10/22/2020, 8:18 AM  [place] MAIN [facility] SERVICES [address] [place], [place], [address] Phone: [phone]   Fax:  [phone]  Name: STELLAROSE CERNY MRN: [id number] Date of Birth: 1990/09/04

## 2020-10-27 ENCOUNTER — Ambulatory Visit: Payer: BLUE CROSS/BLUE SHIELD

## 2020-10-28 ENCOUNTER — Ambulatory Visit: Payer: BLUE CROSS/BLUE SHIELD

## 2020-10-29 ENCOUNTER — Encounter: Payer: Self-pay | Admitting: Obstetrics and Gynecology

## 2020-10-29 ENCOUNTER — Other Ambulatory Visit: Payer: Self-pay

## 2020-10-29 ENCOUNTER — Other Ambulatory Visit: Payer: BLUE CROSS/BLUE SHIELD

## 2020-10-29 ENCOUNTER — Ambulatory Visit (INDEPENDENT_AMBULATORY_CARE_PROVIDER_SITE_OTHER): Payer: BLUE CROSS/BLUE SHIELD | Admitting: Obstetrics and Gynecology

## 2020-10-29 ENCOUNTER — Encounter: Payer: BLUE CROSS/BLUE SHIELD | Admitting: Obstetrics

## 2020-10-29 VITALS — BP 90/68 | Wt 223.0 lb

## 2020-10-29 DIAGNOSIS — Z113 Encounter for screening for infections with a predominantly sexual mode of transmission: Secondary | ICD-10-CM

## 2020-10-29 DIAGNOSIS — Z131 Encounter for screening for diabetes mellitus: Secondary | ICD-10-CM

## 2020-10-29 DIAGNOSIS — Z3A24 24 weeks gestation of pregnancy: Secondary | ICD-10-CM

## 2020-10-29 DIAGNOSIS — O34219 Maternal care for unspecified type scar from previous cesarean delivery: Secondary | ICD-10-CM

## 2020-10-29 DIAGNOSIS — O0992 Supervision of high risk pregnancy, unspecified, second trimester: Secondary | ICD-10-CM

## 2020-10-29 DIAGNOSIS — O99212 Obesity complicating pregnancy, second trimester: Secondary | ICD-10-CM

## 2020-10-29 NOTE — Progress Notes (Signed)
Routine Prenatal Care Visit  Subjective  Susan Hubbard is a 30 y.o. [id number] at [redacted]w[redacted]d being seen today for ongoing prenatal care.  She is currently monitored for the following issues for this high-risk pregnancy and has Vitamin D deficiency; Pure hypercholesterolemia; Kidney stones; Sleep disturbance; Acne; Eczema; History of PCOS; Supervision of high risk pregnancy in first trimester; Obesity affecting pregnancy in first trimester; and H/O cesarean section complicating pregnancy on their problem list.  ----------------------------------------------------------------------------------- Patient reports no complaints.   Contractions: Not present. Vag. Bleeding: None.  Movement: Present. Leaking Fluid denies.  ----------------------------------------------------------------------------------- The following portions of the patient's history were reviewed and updated as appropriate: allergies, current medications, past family history, past medical history, past social history, past surgical history and problem list. Problem list updated.  Objective  Blood pressure 90/68, weight 223 lb (101.2 kg), last menstrual period 05/08/2020. Pregravid weight 216 lb (98 kg) Total Weight Gain 7 lb (3.175 kg) Urinalysis: Urine Protein    Urine Glucose    Fetal Status: Fetal Heart Rate (bpm): 155   Movement: Present     General:  Alert, oriented and cooperative. Patient is in no acute distress.  Skin: Skin is warm and dry. No rash noted.   Cardiovascular: Normal heart rate noted  Respiratory: Normal respiratory effort, no problems with respiration noted  Abdomen: Soft, gravid, appropriate for gestational age. Pain/Pressure: Absent     Pelvic:  Cervical exam deferred        Extremities: Normal range of motion.     Mental Status: Normal mood and affect. Normal behavior. Normal judgment and thought content.   Assessment   30 y.o. [id number] at [redacted]w[redacted]d by  02/12/2021, by Last Menstrual Period presenting for  routine prenatal visit  Plan   pregnancy Problems (from 08/05/20 to present)    Problem Noted Resolved   H/O cesarean section complicating pregnancy 5/32/9924 by Will Bonnet, MD No   Overview Signed 10/29/2020  9:11 AM by Will Bonnet, MD    Due to fetal intolerance  30 y.o. [phone] at [redacted]w[redacted]d with Estimated Date of Delivery: 02/12/21 was seen today in office to discuss trial of labor after cesarean section (TOLAC) versus elective repeat cesarean delivery (ERCD). The following risks were discussed with the patient.  Risk of uterine rupture at term is 0.78 percent with TOLAC and 0.22 percent with ERCD. 1 in 10 uterine ruptures will result in neonatal death or neurological injury. The benefits of a trial of labor after cesarean (TOLAC) resulting in a vaginal birth after cesarean (VBAC) include the following: shorter length of hospital stay and postpartum recovery (in most cases); fewer complications, such as postpartum fever, wound or uterine infection, thromboembolism (blood clots in the leg or lung), need for blood transfusion and fewer neonatal breathing problems. The risks of an attempted VBAC or TOLAC include the following: Risk of failed trial of labor after cesarean (TOLAC) without a vaginal birth after cesarean (VBAC) resulting in repeat cesarean delivery (RCD) in about 20 to 44 percent of women who attempt VBAC.  Her individualized success rate using the MFMU VBAC risk calculator is 46%.   Risk of rupture of uterus resulting in an emergency cesarean delivery. The risk of uterine rupture may be related in part to the type of uterine incision made during the first cesarean delivery. A previous transverse uterine incision has the lowest risk of rupture (0.2 to 1.5 percent risk). Vertical or T-shaped uterine incisions have a higher risk of uterine rupture (4 to 9 percent  risk)The risk of fetal death is very low with both VBAC and elective repeat cesarean delivery (ERCD), but the  likelihood of fetal death is higher with VBAC than with ERCD. Maternal death is very rare with either type of delivery. The risks of an elective repeat cesarean delivery (ERCD) were reviewed with the patient including but not limited to: 08/998 risk of uterine rupture which could have serious consequences, bleeding which may require transfusion; infection which may require antibiotics; injury to bowel, bladder or other surrounding organs (bowel, bladder, ureters); injury to the fetus; need for additional procedures including hysterectomy in the event of a life-threatening hemorrhage; thromboembolic phenomenon; abnormal placentation; incisional problems; death and other postoperative or anesthesia complications.    In addition we discussed that our collective office practice is to allow patient's who desire to attempt TOLAC to go into labor naturally.  There is some limited data that rupture rate may increase past [redacted] weeks gestation, but it is reasonable for women who are strongly committed to [facility] to continue pregnancy into the 41st week.  Medical indications necessetating early delivery may arise during the course of any pregnancy.  Given the contraindication on the use of prostaglandins for use in cervical ripening,  recommendation would be to proceed with repeat cesarean for delivery for patient's with unfavorable cervix (low Bishops score) who reach 41 weeks or who otherwise have a medical indication for early delivery.   These risks and benefits are summarized on the consent form, which was reviewed with the patient during the visit.  All her questions answered and she signed a consent indicating a preference for TOLAC/ERCD. A copy of the consent was given to the patient.      Supervision of high risk pregnancy in first trimester 08/05/2020 by Rod Can, CNM No   Overview Addendum 09/29/2020 11:54 AM by Imagene Riches, CNM     Nursing Staff Provider  Office Location  [facility] Dating   LMP =  13 wk [place]  Language  English Anatomy US  Incomplete- needs repeat.  Flu Vaccine   declined Genetic Screen  NIPS:   declined  TDaP vaccine    Hgb A1C or  GTT A1C: 5.3 Third trimester :   Rhogam   n/a   LAB RESULTS   Feeding Plan  breast Blood Type O/Positive/-- (01/19 1148)   Contraception  unsure Antibody Negative (01/19 1148)  Circumcision  Rubella 2.85 (01/19 1148)  Pediatrician   RPR Non Reactive (01/19 1148)   Support Person  Phillips Odor HBsAg Negative (01/19 1148)   Prenatal Classes  declined HIV Non Reactive (01/19 1148)    Varicella  immune  BTL Consent  n/a GBS  (For PCN allergy, check sensitivities)        VBAC Consent  desires Pap      Hgb Electro   normal adult hgb    CF      SMA               Previous Version       Preterm labor symptoms and general obstetric precautions including but not limited to vaginal bleeding, contractions, leaking of fluid and fetal movement were reviewed in detail with the patient. Please refer to After Visit Summary for other counseling recommendations.   - did not get completion anatomy u/s yesterday.  Another order placed to reschedule - TOLAC counseling today. See problem list for details.   Return in about 3 weeks (around 11/19/2020) for 28 week labs with routine prenatal visit.  Prentice Docker, MD, [facility], [facility] 10/29/2020 9:11 AM

## 2020-11-03 ENCOUNTER — Ambulatory Visit: Payer: BLUE CROSS/BLUE SHIELD

## 2020-11-10 ENCOUNTER — Ambulatory Visit: Payer: BLUE CROSS/BLUE SHIELD

## 2020-11-10 ENCOUNTER — Encounter (HOSPITAL_COMMUNITY): Payer: Self-pay | Admitting: Obstetrics and Gynecology

## 2020-11-10 ENCOUNTER — Other Ambulatory Visit: Payer: Self-pay

## 2020-11-10 ENCOUNTER — Inpatient Hospital Stay (HOSPITAL_COMMUNITY)
Admission: AD | Admit: 2020-11-10 | Discharge: 2020-11-10 | Disposition: A | Discharge status: Home / Self Care | Payer: BLUE CROSS/BLUE SHIELD | Attending: Obstetrics and Gynecology | Admitting: Obstetrics and Gynecology

## 2020-11-10 DIAGNOSIS — Z3A26 26 weeks gestation of pregnancy: Secondary | ICD-10-CM

## 2020-11-10 DIAGNOSIS — O26892 Other specified pregnancy related conditions, second trimester: Secondary | ICD-10-CM | POA: Insufficient documentation

## 2020-11-10 DIAGNOSIS — R109 Unspecified abdominal pain: Secondary | ICD-10-CM

## 2020-11-10 DIAGNOSIS — Z3A27 27 weeks gestation of pregnancy: Secondary | ICD-10-CM | POA: Diagnosis not present

## 2020-11-10 DIAGNOSIS — O219 Vomiting of pregnancy, unspecified: Secondary | ICD-10-CM | POA: Diagnosis present

## 2020-11-10 LAB — URINALYSIS, ROUTINE W REFLEX MICROSCOPIC
Bilirubin Urine: NEGATIVE
Glucose, UA: NEGATIVE mg/dL
Hgb urine dipstick: NEGATIVE
Ketones, ur: NEGATIVE mg/dL
Leukocytes,Ua: NEGATIVE
Nitrite: NEGATIVE
Protein, ur: NEGATIVE mg/dL
Specific Gravity, Urine: 1.024 (ref 1.005–1.030)
pH: 6 (ref 5.0–8.0)

## 2020-11-10 LAB — WET PREP, GENITAL
Clue Cells Wet Prep HPF POC: NONE SEEN
Sperm: NONE SEEN
Trich, Wet Prep: NONE SEEN
Yeast Wet Prep HPF POC: NONE SEEN

## 2020-11-10 MED ORDER — ONDANSETRON 4 MG PO TBDP
8.0000 mg | ORAL_TABLET | Freq: Once | ORAL | Status: AC
  Administered 2020-11-10: 8 mg via ORAL
  Filled 2020-11-10: qty 2

## 2020-11-10 MED ORDER — PROMETHAZINE HCL 25 MG PO TABS: 25.0000 mg | ORAL_TABLET | Freq: Four times a day (QID) | ORAL | 0 refills | Status: DC | PRN

## 2020-11-10 NOTE — MAU Provider Note (Incomplete)
History     CSN: [id number]  Arrival date and time: 11/10/20 1252   Event Date/Time   First Provider Initiated Contact with Patient 11/10/20 1505      Chief Complaint  Patient presents with  . Abdominal Cramping  . Fatigue  . Nausea  . Emesis   HPI  {GYN/OB [id number]}  Past Medical History:  Diagnosis Date  . Anemia   . Bacterial vaginosis 11/11/2015   History of  . Chlamydia   . Chronic kidney disease    h/o kidney stones  . Hyperlipidemia   . Irregular menses   . Oligomenorrhea   . PCOS (polycystic ovarian syndrome)   . PCOS (polycystic ovarian syndrome)   . Vaccine for human papilloma virus (HPV) types 6, 11, 16, and 18 administered   . Vitamin D deficiency   . Vitamin D deficiency     Past Surgical History:  Procedure Laterality Date  . CESAREAN SECTION    . HYSTEROSCOPY WITH D & C N/A 10/20/2015   Procedure: DILATATION AND CURETTAGE /HYSTEROSCOPY;  Surgeon: Malachy Mood, MD;  Location: [facility] ORS;  Service: Gynecology;  Laterality: N/A;    Family History  Problem Relation Age of Onset  . Diabetes Mother        type II  . Hyperlipidemia Mother   . Hypertension Mother   . GER disease Mother   . Sleep apnea Mother   . Hypertension Father   . Hyperlipidemia Maternal Grandmother   . Diabetes Maternal Grandmother        Type II  . Cancer Maternal Grandfather        Lung cancer  . Hypertension Paternal Grandmother   . Brain cancer Paternal Grandmother   . Lung cancer Paternal Grandmother   . Cancer Paternal Grandfather   . Diabetes Paternal Grandfather   . Hyperlipidemia Paternal Grandfather   . Cataracts Paternal Grandfather     Social History   Tobacco Use  . Smoking status: Former Smoker    Packs/day: 0.25    Years: 8.00    Pack years: 2.00    Types: Cigarettes  . Smokeless tobacco: Never Used  . Tobacco comment: quit 2019  Vaping Use  . Vaping Use: Never used  Substance Use Topics  . Alcohol use: No  . Drug use: No    Allergies:   Allergies  Allergen Reactions  . Augmentin [Amoxicillin-Pot Clavulanate] Nausea And Vomiting    Has patient had a PCN reaction causing immediate rash, facial/tongue/throat swelling, SOB or lightheadedness with hypotension: no Has patient had a PCN reaction causing severe rash involving mucus membranes or skin necrosis: unknown Has patient had a PCN reaction that required hospitalization no Has patient had a PCN reaction occurring within the last 10 years: yes If all of the above answers are "NO", then may proceed with Cephalosporin use.     Medications Prior to Admission  Medication Sig Dispense Refill Last Dose  . Prenatal Vit-Fe Fumarate-FA (MULTIVITAMIN-PRENATAL) 27-0.8 MG TABS tablet Take 1 tablet by mouth daily at 12 noon. 30 tablet 0 11/10/2020 at Unknown time  . Fluocinolone Acetonide Body 0.01 % OIL APPLY TO FULL BODY DAILY FOR 2 WEEKS. (AVOID FACE, GROIN, UNDERARMS) (Patient not taking: Reported on 10/29/2020)     . hydrocortisone 2.5 % cream APPLY TO FACE TWICE DAILY FOR 2 WEEKS (Patient not taking: Reported on 10/29/2020)       Review of Systems Physical Exam   Blood pressure 124/67, pulse 89, temperature 98.7 F (37.1 C), temperature  source Oral, resp. rate 18, last menstrual period 05/08/2020, SpO2 99 %.  Physical Exam  MAU Course  Procedures  MDM ***  Assessment and Plan  ***  Celines Femia 11/10/2020, 3:05 PM

## 2020-11-10 NOTE — MAU Note (Signed)
Pt reports nausea not improved following Zofran

## 2020-11-10 NOTE — MAU Provider Note (Signed)
History     CSN: [id number]  Arrival date and time: 11/10/20 1252   Event Date/Time   First Provider Initiated Contact with Patient 11/10/20 1505      Chief Complaint  Patient presents with  . Abdominal Cramping  . Fatigue  . Nausea  . Emesis   Ms. Susan Hubbard is a 30 y.o. year old [id number] female at [redacted]w[redacted]d weeks gestation who presents to MAU reporting N/V "for weeks."  She reports menstrual-like cramping in the lower abdomen/suprapubic area this AM that has lasted all day; rated 8/10. She denies fever, chill, SROM, VB or vaginal d/c. She reports some DFM earlier today, but good (+) FM since arrival to MAU. She receives her [facility] with [facility], but transferring to [facility]; next appt on 11/26/20.   OB History    Gravida  5   Para  1   Term  1   Preterm      AB  3   Living  1     SAB  1   IAB  2   Ectopic      Multiple      Live Births  1           Past Medical History:  Diagnosis Date  . Anemia   . Bacterial vaginosis 11/11/2015   History of  . Chlamydia   . Chronic kidney disease    h/o kidney stones  . Hyperlipidemia   . Irregular menses   . Oligomenorrhea   . PCOS (polycystic ovarian syndrome)   . PCOS (polycystic ovarian syndrome)   . Vaccine for human papilloma virus (HPV) types 6, 11, 16, and 18 administered   . Vitamin D deficiency   . Vitamin D deficiency     Past Surgical History:  Procedure Laterality Date  . CESAREAN SECTION    . HYSTEROSCOPY WITH D & C N/A 10/20/2015   Procedure: DILATATION AND CURETTAGE /HYSTEROSCOPY;  Surgeon: Malachy Mood, MD;  Location: [facility] ORS;  Service: Gynecology;  Laterality: N/A;    Family History  Problem Relation Age of Onset  . Diabetes Mother        type II  . Hyperlipidemia Mother   . Hypertension Mother   . GER disease Mother   . Sleep apnea Mother   . Hypertension Father   . Hyperlipidemia Maternal Grandmother   . Diabetes Maternal Grandmother        Type II  . Cancer Maternal  Grandfather        Lung cancer  . Hypertension Paternal Grandmother   . Brain cancer Paternal Grandmother   . Lung cancer Paternal Grandmother   . Cancer Paternal Grandfather   . Diabetes Paternal Grandfather   . Hyperlipidemia Paternal Grandfather   . Cataracts Paternal Grandfather     Social History   Tobacco Use  . Smoking status: Former Smoker    Packs/day: 0.25    Years: 8.00    Pack years: 2.00    Types: Cigarettes  . Smokeless tobacco: Never Used  . Tobacco comment: quit 2019  Vaping Use  . Vaping Use: Never used  Substance Use Topics  . Alcohol use: No  . Drug use: No    Allergies:  Allergies  Allergen Reactions  . Augmentin [Amoxicillin-Pot Clavulanate] Nausea And Vomiting    Has patient had a PCN reaction causing immediate rash, facial/tongue/throat swelling, SOB or lightheadedness with hypotension: no Has patient had a PCN reaction causing severe rash involving mucus membranes or skin necrosis:  unknown Has patient had a PCN reaction that required hospitalization no Has patient had a PCN reaction occurring within the last 10 years: yes If all of the above answers are "NO", then may proceed with Cephalosporin use.     Medications Prior to Admission  Medication Sig Dispense Refill Last Dose  . Prenatal Vit-Fe Fumarate-FA (MULTIVITAMIN-PRENATAL) 27-0.8 MG TABS tablet Take 1 tablet by mouth daily at 12 noon. 30 tablet 0 11/10/2020 at Unknown time  . Fluocinolone Acetonide Body 0.01 % OIL APPLY TO FULL BODY DAILY FOR 2 WEEKS. (AVOID FACE, GROIN, UNDERARMS) (Patient not taking: Reported on 10/29/2020)     . hydrocortisone 2.5 % cream APPLY TO FACE TWICE DAILY FOR 2 WEEKS (Patient not taking: Reported on 10/29/2020)       Review of Systems  Constitutional: Negative.  Negative for chills and fever.  HENT: Negative.   Eyes: Negative.   Respiratory: Negative.   Cardiovascular: Negative.   Gastrointestinal: Positive for nausea ("for weeks") and vomiting ("for weeks").   Endocrine: Negative.   Genitourinary: Positive for pelvic pain (cramping in lower abdominal/suprapubic area since this AM; 8/10). Vaginal bleeding: denies VB. Vaginal discharge: denies LOF.       DFM earlier, but (+) FM since arrival to MAU  Musculoskeletal: Negative.   Skin: Negative.   Allergic/Immunologic: Negative.   Neurological: Negative.   Hematological: Negative.   Psychiatric/Behavioral: Negative.    Physical Exam   Blood pressure 124/67, pulse 89, temperature 98.7 F (37.1 C), temperature source Oral, resp. rate 18, last menstrual period 05/08/2020, SpO2 99 %.  Physical Exam Vitals and nursing note reviewed. Exam conducted with a chaperone present.  Constitutional:      Appearance: Normal appearance. She is obese.  Abdominal:     Palpations: Abdomen is soft.  Genitourinary:    Comments: Dilation: Closed Effacement (%): Thick Cervical Position: Posterior Station: Ballotable Presentation: Vertex Exam by: Sunday Corn, CNM  Skin:    General: Skin is warm and dry.  Neurological:     Mental Status: She is alert and oriented to person, place, and time.  Psychiatric:        Mood and Affect: Mood normal.        Behavior: Behavior normal.        Thought Content: Thought content normal.        Judgment: Judgment normal.   REACTIVE NST - FHR: 155 bpm / moderate variability / accels present / decels absent / TOCO: irregular UC (about 1 every 30 minutes) with UI   MAU Course  Procedures  MDM CCUA Zofran 8 mg ODT -- Nausea not resolved, but no vomiting since medication Offered Phenergan, but driving self to hospital  Results for orders placed or performed during the hospital encounter of 11/10/20 (from the past 24 hour(s))  Urinalysis, Routine w reflex microscopic Urine, Clean Catch     Status: Abnormal   Collection Time: 11/10/20  1:24 PM  Result Value Ref Range   Color, Urine YELLOW YELLOW   APPearance HAZY (A) CLEAR   Specific Gravity, Urine 1.024 1.005 - 1.030    pH 6.0 5.0 - 8.0   Glucose, UA NEGATIVE NEGATIVE mg/dL   Hgb urine dipstick NEGATIVE NEGATIVE   Bilirubin Urine NEGATIVE NEGATIVE   Ketones, ur NEGATIVE NEGATIVE mg/dL   Protein, ur NEGATIVE NEGATIVE mg/dL   Nitrite NEGATIVE NEGATIVE   Leukocytes,Ua NEGATIVE NEGATIVE    Assessment and Plan  Nausea and vomiting in pregnancy - Information provided on morning sickness  -  Rx for Phenergan 25 mg every 6 hours prn N/V  [redacted] weeks gestation of pregnancy  Abdominal pain during pregnancy in second trimester  - Information provided on abdominal pain in pregnancy   - Discharge patient - Keep scheduled appt with [facility] on 11/26/20 - Patient verbalized an understanding of the plan of care and agrees.     Laury Deep, CNM 11/10/2020, 3:06 PM

## 2020-11-10 NOTE — MAU Note (Signed)
Pt states she has had nausea and vomiting for weeks but this morning began having menstrual type cramping. Has lasted all day. Rates cramping pain at 8 on 0-10 scale. Located across lower abdomen-suprapubic area. Denies SROM, vaginal bleeding or vaginal discharge. Endorses + fetal movement since in MAU was decreased earlier today. Denies fever or chills.

## 2020-11-10 NOTE — MAU Note (Signed)
Zofran given as ordered for nausea. Vaginal swabs collected after explanation to pt. Pt reports cramping frequency has decreased.

## 2020-11-11 LAB — GC/CHLAMYDIA PROBE AMP (~~LOC~~) NOT AT ARMC
Chlamydia: NEGATIVE
Comment: NEGATIVE
Comment: NORMAL
Neisseria Gonorrhea: NEGATIVE

## 2020-11-25 ENCOUNTER — Ambulatory Visit: Payer: BLUE CROSS/BLUE SHIELD

## 2020-11-25 ENCOUNTER — Other Ambulatory Visit: Payer: Self-pay

## 2020-11-25 ENCOUNTER — Ambulatory Visit (INDEPENDENT_AMBULATORY_CARE_PROVIDER_SITE_OTHER): Payer: BLUE CROSS/BLUE SHIELD | Admitting: Obstetrics

## 2020-11-25 ENCOUNTER — Other Ambulatory Visit: Payer: BLUE CROSS/BLUE SHIELD

## 2020-11-25 ENCOUNTER — Encounter: Payer: Self-pay | Admitting: Obstetrics

## 2020-11-25 VITALS — BP 114/74 | HR 102 | Wt 230.0 lb

## 2020-11-25 DIAGNOSIS — O34219 Maternal care for unspecified type scar from previous cesarean delivery: Secondary | ICD-10-CM

## 2020-11-25 DIAGNOSIS — Z3480 Encounter for supervision of other normal pregnancy, unspecified trimester: Secondary | ICD-10-CM

## 2020-11-25 NOTE — Progress Notes (Signed)
Subjective:  Susan Hubbard is a 30 y.o. [id number] at [redacted]w[redacted]d being seen today for ongoing prenatal care.  She is currently monitored for the following issues for this low-risk pregnancy and has Vitamin D deficiency; Pure hypercholesterolemia; Kidney stones; Sleep disturbance; Acne; Eczema; History of PCOS; Supervision of high risk pregnancy in first trimester; Obesity affecting pregnancy in first trimester; and H/O cesarean section complicating pregnancy on their problem list.  Patient reports no complaints.  Contractions: Not present. Vag. Bleeding: None.  Movement: Present. Denies leaking of fluid.   The following portions of the patient's history were reviewed and updated as appropriate: allergies, current medications, past family history, past medical history, past social history, past surgical history and problem list. Problem list updated.  Objective:   Vitals:   11/25/20 0848  BP: 114/74  Pulse: (!) 102  Weight: 230 lb (104.3 kg)    Fetal Status:     Movement: Present     General:  Alert, oriented and cooperative. Patient is in no acute distress.  Skin: Skin is warm and dry. No rash noted.   Cardiovascular: Normal heart rate noted  Respiratory: Normal respiratory effort, no problems with respiration noted  Abdomen: Soft, gravid, appropriate for gestational age. Pain/Pressure: Present     Pelvic:  Cervical exam deferred        Extremities: Normal range of motion.     Mental Status: Normal mood and affect. Normal behavior. Normal judgment and thought content.   Urinalysis:      Assessment and Plan:  Pregnancy: G5P1031 at [redacted]w[redacted]d  1. Supervision of other normal pregnancy, antepartum Rx: - Glucose Tolerance, 2 Hours w/1 Hour - CBC - HIV Antibody (routine testing w rflx) - RPR  2. H/O cesarean section complicating pregnancy - considering VBAC - VBAC consent form dispensed   Preterm labor symptoms and general obstetric precautions including but not limited to vaginal  bleeding, contractions, leaking of fluid and fetal movement were reviewed in detail with the patient. Please refer to After Visit Summary for other counseling recommendations.   Return in about 2 weeks (around 12/09/2020) for ROB.   Shelly Bombard, MD  11/25/20

## 2020-11-26 ENCOUNTER — Other Ambulatory Visit: Payer: BLUE CROSS/BLUE SHIELD

## 2020-11-26 ENCOUNTER — Encounter: Payer: BLUE CROSS/BLUE SHIELD | Admitting: Student

## 2020-11-30 ENCOUNTER — Other Ambulatory Visit (INDEPENDENT_AMBULATORY_CARE_PROVIDER_SITE_OTHER): Payer: BLUE CROSS/BLUE SHIELD | Admitting: Obstetrics

## 2020-11-30 DIAGNOSIS — O24410 Gestational diabetes mellitus in pregnancy, diet controlled: Secondary | ICD-10-CM

## 2020-11-30 LAB — CBC
Hematocrit: 32.4 % — ABNORMAL LOW (ref 34.0–46.6)
Hemoglobin: 11.2 g/dL (ref 11.1–15.9)
MCH: 29.6 pg (ref 26.6–33.0)
MCHC: 34.6 g/dL (ref 31.5–35.7)
MCV: 86 fL (ref 79–97)
Platelets: 280 10*3/uL (ref 150–450)
RBC: 3.79 x10E6/uL (ref 3.77–5.28)
RDW: 12.6 % (ref 11.7–15.4)
WBC: 7.4 10*3/uL (ref 3.4–10.8)

## 2020-11-30 LAB — GLUCOSE TOLERANCE, 2 HOURS W/ 1HR
Glucose, 1 hour: 189 mg/dL — ABNORMAL HIGH (ref 65–179)
Glucose, 2 hour: 137 mg/dL (ref 65–152)
Glucose, Fasting: 81 mg/dL (ref 65–91)

## 2020-11-30 LAB — RPR: RPR Ser Ql: NONREACTIVE

## 2020-11-30 LAB — HIV ANTIBODY (ROUTINE TESTING W REFLEX): HIV Screen 4th Generation wRfx: NONREACTIVE

## 2020-12-01 ENCOUNTER — Other Ambulatory Visit: Payer: Self-pay

## 2020-12-01 DIAGNOSIS — O24410 Gestational diabetes mellitus in pregnancy, diet controlled: Secondary | ICD-10-CM

## 2020-12-01 MED ORDER — ACCU-CHEK GUIDE W/DEVICE KIT: PACK | 0 refills | Status: DC

## 2020-12-01 MED ORDER — ACCU-CHEK SOFTCLIX LANCETS MISC: 12 refills | Status: DC

## 2020-12-01 MED ORDER — ACCU-CHEK GUIDE VI STRP: ORAL_STRIP | 12 refills | Status: DC

## 2020-12-02 ENCOUNTER — Encounter: Payer: Self-pay | Admitting: Registered"

## 2020-12-02 ENCOUNTER — Other Ambulatory Visit: Payer: Self-pay

## 2020-12-02 ENCOUNTER — Encounter: Payer: BLUE CROSS/BLUE SHIELD | Attending: Obstetrics | Admitting: Registered"

## 2020-12-02 DIAGNOSIS — O24419 Gestational diabetes mellitus in pregnancy, unspecified control: Secondary | ICD-10-CM | POA: Insufficient documentation

## 2020-12-02 DIAGNOSIS — Z8632 Personal history of gestational diabetes: Secondary | ICD-10-CM | POA: Insufficient documentation

## 2020-12-02 NOTE — Progress Notes (Addendum)
Patient was seen on 12/02/20 for Gestational Diabetes self-management class at the [facility]. The following learning objectives were met by the patient during this course:   States the definition of Gestational Diabetes  States why dietary management is important in controlling blood glucose  Describes the effects each nutrient has on blood glucose levels  Demonstrates ability to create a balanced meal plan  Demonstrates carbohydrate counting   States when to check blood glucose levels  Demonstrates proper blood glucose monitoring techniques  States the effect of stress and exercise on blood glucose levels  States the importance of limiting caffeine and abstaining from alcohol and smoking  Blood glucose monitor given: Patient has meter and is checking blood sugar prior to class  Patient instructed to monitor glucose levels: FBS: 60 - <95; 1 hour: <140; 2 hour: <120  Patient received handouts:  Nutrition Diabetes and Pregnancy, including carb counting list  Patient will be seen for follow-up as needed.  Patient was assessed and managed by nursing staff during this encounter. I have reviewed the chart and agree with the documentation and plan. I have also made any necessary editorial changes.  Charles Harper, MD 12/02/2020 2:28 PM   

## 2020-12-08 ENCOUNTER — Other Ambulatory Visit: Payer: Self-pay | Admitting: *Deleted

## 2020-12-08 ENCOUNTER — Other Ambulatory Visit: Payer: Self-pay | Admitting: Obstetrics and Gynecology

## 2020-12-08 ENCOUNTER — Other Ambulatory Visit: Payer: Self-pay

## 2020-12-08 ENCOUNTER — Ambulatory Visit: Payer: BLUE CROSS/BLUE SHIELD | Attending: Obstetrics and Gynecology

## 2020-12-08 DIAGNOSIS — O99212 Obesity complicating pregnancy, second trimester: Secondary | ICD-10-CM

## 2020-12-08 DIAGNOSIS — O24419 Gestational diabetes mellitus in pregnancy, unspecified control: Secondary | ICD-10-CM | POA: Insufficient documentation

## 2020-12-08 DIAGNOSIS — O0992 Supervision of high risk pregnancy, unspecified, second trimester: Secondary | ICD-10-CM | POA: Diagnosis present

## 2020-12-09 ENCOUNTER — Ambulatory Visit (INDEPENDENT_AMBULATORY_CARE_PROVIDER_SITE_OTHER): Payer: BLUE CROSS/BLUE SHIELD | Admitting: Women's Health

## 2020-12-09 VITALS — BP 126/77 | HR 94 | Wt 229.0 lb

## 2020-12-09 DIAGNOSIS — Z3A30 30 weeks gestation of pregnancy: Secondary | ICD-10-CM

## 2020-12-09 DIAGNOSIS — O24419 Gestational diabetes mellitus in pregnancy, unspecified control: Secondary | ICD-10-CM

## 2020-12-09 DIAGNOSIS — O99211 Obesity complicating pregnancy, first trimester: Secondary | ICD-10-CM

## 2020-12-09 DIAGNOSIS — O0991 Supervision of high risk pregnancy, unspecified, first trimester: Secondary | ICD-10-CM

## 2020-12-09 DIAGNOSIS — O34219 Maternal care for unspecified type scar from previous cesarean delivery: Secondary | ICD-10-CM

## 2020-12-09 NOTE — Progress Notes (Signed)
Subjective:  Susan Hubbard is a 30 y.o. [id number] at [redacted]w[redacted]d being seen today for ongoing prenatal care.  She is currently monitored for the following issues for this high-risk pregnancy and has Vitamin D deficiency; Pure hypercholesterolemia; Kidney stones; Sleep disturbance; Acne; Eczema; History of PCOS; Supervision of high risk pregnancy in first trimester; Obesity affecting pregnancy in first trimester; H/O cesarean section complicating pregnancy; and Gestational diabetes mellitus (GDM), antepartum on their problem list.  Patient reports no complaints.  Contractions: Not present. Vag. Bleeding: None.  Movement: Present. Denies leaking of fluid.   The following portions of the patient's history were reviewed and updated as appropriate: allergies, current medications, past family history, past medical history, past social history, past surgical history and problem list. Problem list updated.  Objective:   Vitals:   12/09/20 1132  BP: 126/77  Pulse: 94  Weight: 229 lb (103.9 kg)    Fetal Status: Fetal Heart Rate (bpm): 159 Fundal Height: 34 cm Movement: Present     General:  Alert, oriented and cooperative. Patient is in no acute distress.  Skin: Skin is warm and dry. No rash noted.   Cardiovascular: Normal heart rate noted  Respiratory: Normal respiratory effort, no problems with respiration noted  Abdomen: Soft, gravid, appropriate for gestational age. Pain/Pressure: Present     Pelvic: Vag. Bleeding: None     Cervical exam deferred        Extremities: Normal range of motion.  Edema: None  Mental Status: Normal mood and affect. Normal behavior. Normal judgment and thought content.   Urinalysis:      Assessment and Plan:  Pregnancy: G5P1031 at [redacted]w[redacted]d  1. Supervision of high risk pregnancy in first trimester -EFW 30% on [place] 12/08/2020  2. Gestational diabetes mellitus (GDM), antepartum, gestational diabetes method of control unspecified -pt did not bring her glucose log, left it  in the car, pt will take photo of glucose log and send to office via MyChart  3. Obesity affecting pregnancy in first trimester -pt not previously started on low dose ASA  4. H/O cesarean section complicating pregnancy -desires VBAC  5. [redacted] weeks gestation of pregnancy  Preterm labor symptoms and general obstetric precautions including but not limited to vaginal bleeding, contractions, leaking of fluid and fetal movement were reviewed in detail with the patient. I discussed the assessment and treatment plan with the patient. The patient was provided an opportunity to ask questions and all were answered. The patient agreed with the plan and demonstrated an understanding of the instructions. The patient was advised to call back or seek an in-person office evaluation/go to MAU at [facility] for any urgent or concerning symptoms. Please refer to After Visit Summary for other counseling recommendations.  Return in about 2 weeks (around 12/23/2020) for in-person LOB/MD ONLY/discuss VBAC.   Lyndell Gillyard, Gerrie Nordmann, NP

## 2020-12-09 NOTE — Patient Instructions (Signed)
Maternity Assessment Unit (MAU)  The Maternity Assessment Unit (MAU) is located at the [facility] and [place] [facility]. The address is: [address], [place], [place], [place] [address]. Please see map below for additional directions.    The Maternity Assessment Unit is designed to help you during your pregnancy, and for up to 6 weeks after delivery, with any pregnancy- or postpartum-related emergencies, if you think you are in labor, or if your water has broken. For example, if you experience nausea and vomiting, vaginal bleeding, severe abdominal or pelvic pain, elevated blood pressure or other problems related to your pregnancy or postpartum time, please come to the Maternity Assessment Unit for assistance.        Preterm Labor The normal length of a pregnancy is 39-41 weeks. Preterm labor is when labor starts before 37 completed weeks of pregnancy. Babies who are born prematurely and survive may not be fully developed and may be at an increased risk for long-term problems such as cerebral palsy, developmental delays, and vision and hearing problems. Babies who are born too early may have problems soon after birth. Problems may include regulating blood sugar, body temperature, heart rate, and breathing rate. These babies often have trouble with feeding. The risk of having problems is highest for babies who are born before 44 weeks of pregnancy. What are the causes? The exact cause of this condition is not known. What increases the risk? You are more likely to have preterm labor if you have certain risk factors that relate to your medical history, problems with present and past pregnancies, and lifestyle factors. Medical history  You have abnormalities of the uterus, including a short cervix.  You have STIs (sexually transmitted infections), or other infections of the urinary tract and the vagina.  You have chronic illnesses, such as blood clotting problems,  diabetes, or high blood pressure.  You are overweight or underweight. Present and past pregnancies  You have had preterm labor before.  You are pregnant with twins or other multiples.  You have been diagnosed with a condition in which the placenta covers your cervix (placenta previa).  You waited less than 6 months between giving birth and becoming pregnant again.  Your unborn baby has some abnormalities.  You have vaginal bleeding during pregnancy.  You became pregnant through in vitro fertilization (IVF). Lifestyle and environmental factors  You use tobacco products.  You drink alcohol.  You use street drugs.  You have stress and no social support.  You experience domestic violence.  You are exposed to certain chemicals or environmental pollutants. Other factors  You are younger than age 110 or older than age 19. What are the signs or symptoms? Symptoms of this condition include:  Cramps similar to those that can happen during a menstrual period. The cramps may happen with diarrhea.  Pain in the abdomen or lower back.  Regular contractions that may feel like tightening of the abdomen.  A feeling of increased pressure in the pelvis.  Increased watery or bloody mucus discharge from the vagina.  Water breaking (ruptured amniotic sac). How is this diagnosed? This condition is diagnosed based on:  Your medical history and a physical exam.  A pelvic exam.  An ultrasound.  Monitoring your uterus for contractions.  Other tests, including: ? A swab of the cervix to check for a chemical called fetal fibronectin. ? Urine tests. How is this treated? Treatment for this condition depends on the length of your pregnancy, your  condition, and the health of your baby. Treatment may include:  Taking medicines, such as: ? Hormone medicines. These may be given early in pregnancy to help support the pregnancy. ? Medicines to stop contractions. ? Medicines to help mature  the baby's lungs. These may be prescribed if the risk of delivery is high. ? Medicines to prevent your baby from developing cerebral palsy.  Bed rest. If the labor happens before 34 weeks of pregnancy, you may need to stay in the hospital.  Delivery of the baby. Follow these instructions at home:  Do not use any products that contain nicotine or tobacco, such as cigarettes, e-cigarettes, and chewing tobacco. If you need help quitting, ask your health care provider.  Do not drink alcohol.  Take over-the-counter and prescription medicines only as told by your health care provider.  Rest as told by your health care provider.  Return to your normal activities as told by your health care provider. Ask your health care provider what activities are safe for you.  Keep all follow-up visits as told by your health care provider. This is important.   How is this prevented? To increase your chance of having a full-term pregnancy:  Do not use street drugs or medicines that have not been prescribed to you during your pregnancy.  Talk with your health care provider before taking any herbal supplements, even if you have been taking them regularly.  Make sure you gain a healthy amount of weight during your pregnancy.  Watch for infection. If you think that you might have an infection, get it checked right away. Symptoms of infection may include: ? Fever. ? Abnormal vaginal discharge or discharge that smells bad. ? Pain or burning with urination. ? Needing to urinate urgently. ? Frequently urinating or passing small amounts of urine frequently. ? Blood in your urine. ? Urine that smells bad or unusual.  Tell your health care provider if you have had preterm labor before. Contact a health care provider if:  You think you are going into preterm labor.  You have signs or symptoms of preterm labor.  You have symptoms of infection. Get help right away if:  You are having regular, painful  contractions every 5 minutes or less.  Your water breaks. Summary  Preterm labor is labor that starts before you reach 37 weeks of pregnancy.  Delivering your baby early increases your baby's risk of developing lifelong problems.  The exact cause of preterm labor is unknown. However, having an abnormal uterus, an STI (sexually transmitted infection), or vaginal bleeding during pregnancy increases your risk for preterm labor.  Keep all follow-up visits as told by your health care provider. This is important.  Contact a health care provider if you have signs or symptoms of preterm labor. This information is not intended to replace advice given to you by your health care provider. Make sure you discuss any questions you have with your health care provider. Document Revised: 08/06/2019 Document Reviewed: 08/06/2019 Elsevier Patient Education  2021 [place].        Gestational Diabetes Mellitus, Self-Care Caring for yourself after a diagnosis of gestational diabetes mellitus means keeping your blood sugar under control. This can be done through nutrition, exercise, lifestyle changes, insulin and other medicines, and support from your health care team. Your health care provider will set individualized treatment goals for you. What are the risks? If left untreated, gestational diabetes can cause problems for mother and baby. For the mother Women who get gestational  diabetes are more likely to:  Have labor induced and deliver early.  Have problems during labor and delivery, if the baby is larger than normal. This includes difficult labor and damage to the birth canal.  Have a cesarean delivery.  Have problems with blood pressure, including high blood pressure and preeclampsia.  Get it again if they become pregnant.  Develop type 2 diabetes in the future. For the baby Gestational diabetes that is not treated can cause the baby to have:  Low blood glucose  (hypoglycemia).  Larger-than-normal body size (macrosomia).  Breathing problems. How to monitor blood glucose Check your blood glucose every day and as often as told by your health care provider. To do this: 1. Wash your hands with soap and water for at least 20 seconds. 2. Prick the side of your finger (not the tip) with the lancet. Use a different finger each time. 3. Gently rub the finger until a small drop of blood appears. 4. Follow instructions that come with your meter for inserting the test strip, applying blood to the strip, and getting the result. 5. Write down your result and any notes. Blood glucose goals are:  95 mg/dL (5.3 mmol/L) when fasting.  140 mg/dL (7.8 mmol/L) 1 hour after a meal.  120 mg/dL (6.7 mmol/L) 2 hours after a meal.   Follow these instructions at home: Medicines  Take over-the-counter and prescription medicines only as told by your health care provider.  If your health care provider prescribed insulin or other diabetes medicines: ? Take them every day. ? Do not run out of insulin or other medicines. Plan ahead so you always have them available. Eating and drinking  Follow instructions from your health care provider about eating or drinking restrictions.  See a diet and nutrition expert (registered dietician) to help you create an eating plan that helps control your diabetes. The foods in this plan will include: ? Lean proteins. ? Complex carbohydrates. These are carbohydrates that contain fiber, have a lot of nutrients, and are digested slowly. They include dried beans, nuts, and whole grain breads, cereals, or pasta. ? Fresh fruits and vegetables. ? Low-fat dairy products. ? Healthy fats.  Eat healthy snacks between nutritious meals.  Drink enough fluid to keep your urine pale yellow.  Keep a record of the carbohydrates that you eat. To do this: ? Read food labels. ? Learn the standard serving sizes of foods.  Make a sick day plan with  your health care provider before you get sick. Follow this plan whenever you cannot eat or drink as usual.   Activity  Do exercises as told by your health care provider.  Do 30 or more minutes of physical activity a day, or as much physical activity as your health care provider recommends. It may help to control blood glucose levels after a meal if you: ? Do 10 minutes of exercise after each meal. ? Start this exercise 30 minutes after the meal.  If you start a new exercise or activity, work with your health care provider to adjust your insulin, other medicines, or food as needed. Lifestyle  Do not drink alcohol.  Do not use any products that contain nicotine or tobacco, such as cigarettes, e-cigarettes, and chewing tobacco. If you need help quitting, ask your health care provider.  Learn to manage stress. If you need help with this, ask your health care provider. Body care  Keep your vaccines up to date.  Practice good oral hygiene. To do this: ?  Clean your teeth and gums two times a day. ? Floss one or more times a day. ? Visit your dentist one or more times every 6 months.  Stay at a healthy weight while you are pregnant. Your expected weight gain depends on your BMI (body mass index) before pregnancy. General instructions  Talk with your health care provider about the risk for high blood pressure during pregnancy (preeclampsia and eclampsia).  Share your diabetes management plan with people in your workplace, school, and household.  Check your urine for ketones when sick and as told by your health care provider. Ketones are made by the liver when a lack of glucose forces the body to use fat for energy.  Carry a medical alert card or wear medical alert jewelry that says you have gestational diabetes.  Keep all follow-up visits. This is important. Get care after delivery  Have your blood glucose level checked with an oral glucose tolerance test (OGTT) 4-12 weeks after  delivery.  Get screened for diabetes at least every 3 years, or as often as told by your health care provider. Where to find more information  American Diabetes Association (ADA): diabetes.org  Association of Diabetes Care & Education Specialists (ADCES): diabeteseducator.org  Centers for Disease Control and Prevention (CDC): StoreMirror.com.cy  American Pregnancy Association: americanpregnancy.org  U.S. Department of Agriculture MyPlate: http://www.wilson-mendoza.org/ Contact a health care provider if:  Your blood glucose is above your target for two tests in a row.  You have a fever.  You have been sick for 2 days or more and are not getting better.  You have either of these problems for more than 6 hours: ? Vomiting every time you eat or drink. ? Diarrhea. Get help right away if you:  Become confused or cannot think clearly.  Have trouble breathing.  Have moderate or high ketones in your urine.  Feel your baby is not moving as usual.  Develop unusual discharge or bleeding from your vagina.  Start having early (premature) contractions. Contractions may feel like a tightening in your lower abdomen  Have a severe headache. These symptoms may represent a serious problem that is an emergency. Do not wait to see if the symptoms will go away. Get medical help right away. Call your local emergency services (911 in the [place] Do not drive yourself to the hospital. Summary  Check your blood glucose every day during your pregnancy. Do this as often as told by your health care provider.  Take insulin or other diabetes medicines every day, if your health care provider prescribed them.  Have your blood glucose level checked 4-12 weeks after delivery.  Keep all follow-up visits. This is important. This information is not intended to replace advice given to you by your health care provider. Make sure you discuss any questions you have with your health care provider. Document Revised: 12/09/2019 Document  Reviewed: 12/09/2019 Elsevier Patient Education  [place].

## 2020-12-09 NOTE — Progress Notes (Signed)
ROB c/o pressure and numbness in both hands

## 2020-12-13 ENCOUNTER — Encounter (HOSPITAL_COMMUNITY): Payer: Self-pay | Admitting: Obstetrics and Gynecology

## 2020-12-13 ENCOUNTER — Other Ambulatory Visit: Payer: Self-pay

## 2020-12-13 ENCOUNTER — Inpatient Hospital Stay (HOSPITAL_COMMUNITY)
Admission: AD | Admit: 2020-12-13 | Discharge: 2020-12-13 | Disposition: A | Discharge status: Home / Self Care | Payer: BLUE CROSS/BLUE SHIELD | Attending: Family Medicine | Admitting: Family Medicine

## 2020-12-13 DIAGNOSIS — R102 Pelvic and perineal pain: Secondary | ICD-10-CM

## 2020-12-13 DIAGNOSIS — O0991 Supervision of high risk pregnancy, unspecified, first trimester: Secondary | ICD-10-CM

## 2020-12-13 DIAGNOSIS — O34219 Maternal care for unspecified type scar from previous cesarean delivery: Secondary | ICD-10-CM

## 2020-12-13 LAB — URINALYSIS, ROUTINE W REFLEX MICROSCOPIC
Bilirubin Urine: NEGATIVE
Glucose, UA: NEGATIVE mg/dL
Hgb urine dipstick: NEGATIVE
Ketones, ur: NEGATIVE mg/dL
Leukocytes,Ua: NEGATIVE
Nitrite: NEGATIVE
Protein, ur: NEGATIVE mg/dL
Specific Gravity, Urine: 1.023 (ref 1.005–1.030)
pH: 6 (ref 5.0–8.0)

## 2020-12-13 NOTE — MAU Note (Signed)
Susan Hubbard is a 30 y.o. at [redacted]w[redacted]d here in MAU reporting: pelvic pressure. States she normally has some pelvic pressure but states today it has been more constant and more uncomfortable. States she was cramping earlier but that has stopped. No LOF. +FM  Onset of complaint: today  Pain score: 8/10  Vitals:   12/13/20 1849  BP: 133/81  Pulse: 89  Resp: 16  Temp: 98.7 F (37.1 C)  SpO2: 100%     FHT: 132  Lab orders placed from triage: UA

## 2020-12-13 NOTE — MAU Provider Note (Signed)
History     CSN: [id number]  Arrival date and time: 12/13/20 1831   Event Date/Time   First Provider Initiated Contact with Patient 12/13/20 1928      Chief Complaint  Patient presents with  . Pelvic Pain   HPI Susan Hubbard 30 y.o. 56w2dComes to MAU with worsening pelvic pain and she is concerned about having contractions.  [place] [facility] [place] [facility] [place] [facility]  Is on her feet at work.  Tried a pregnancy support belt and it was working at first, but now she notices no difference from the support belt.  Having some pain in her lower pelvis and low back.  OB History    Gravida  5   Para  1   Term  1   Preterm      AB  3   Living  1     SAB  1   IAB  2   Ectopic      Multiple      Live Births  1           Past Medical History:  Diagnosis Date  . Anemia   . Bacterial vaginosis 11/11/2015   History of  . Chlamydia   . Chronic kidney disease    h/o kidney stones  . Hyperlipidemia   . Irregular menses   . Oligomenorrhea   . PCOS (polycystic ovarian syndrome)   . PCOS (polycystic ovarian syndrome)   . Vaccine for human papilloma virus (HPV) types 6, 11, 16, and 18 administered   . Vitamin D deficiency   . Vitamin D deficiency     Past Surgical History:  Procedure Laterality Date  . CESAREAN SECTION    . HYSTEROSCOPY WITH D & C N/A 10/20/2015   Procedure: DILATATION AND CURETTAGE /HYSTEROSCOPY;  Surgeon: AMalachy Mood MD;  Location: [facility] ORS;  Service: Gynecology;  Laterality: N/A;    Family History  Problem Relation Age of Onset  . Diabetes Mother        type II  . Hyperlipidemia Mother   . Hypertension Mother   . GER disease Mother   . Sleep apnea Mother   . Hypertension Father   . Hyperlipidemia Maternal Grandmother   . Diabetes Maternal Grandmother        Type II  . Cancer Maternal Grandfather        Lung cancer  . Hypertension Paternal  Grandmother   . Brain cancer Paternal Grandmother   . Lung cancer Paternal Grandmother   . Cancer Paternal Grandfather   . Diabetes Paternal Grandfather   . Hyperlipidemia Paternal Grandfather   . Cataracts Paternal Grandfather     Social History   Tobacco Use  . Smoking status: Former Smoker    Packs/day: 0.25    Years: 8.00    Pack years: 2.00    Types: Cigarettes  . Smokeless tobacco: Never Used  . Tobacco comment: quit 2019  Vaping Use  . Vaping Use: Never used  Substance Use Topics  . Alcohol use: No  . Drug use: No    Allergies:  Allergies  Allergen Reactions  . Augmentin [Amoxicillin-Pot Clavulanate] Nausea And Vomiting    Has patient had a PCN reaction causing immediate rash, facial/tongue/throat swelling, SOB or lightheadedness with hypotension: no Has patient had a PCN reaction causing severe rash involving mucus membranes or skin necrosis: unknown Has patient had  a PCN reaction that required hospitalization no Has patient had a PCN reaction occurring within the last 10 years: yes If all of the above answers are "NO", then may proceed with Cephalosporin use.     Medications Prior to Admission  Medication Sig Dispense Refill Last Dose  . Prenatal Vit-Fe Fumarate-FA (MULTIVITAMIN-PRENATAL) 27-0.8 MG TABS tablet Take 1 tablet by mouth daily at 12 noon. 30 tablet 0 12/12/2020 at Unknown time  . Accu-Chek Softclix Lancets lancets Use to check blood sugars four times a day as instructed 100 each 12   . Blood Glucose Monitoring Suppl (ACCU-CHEK GUIDE) w/Device KIT Use to check blood sugars four times a day as instructed 1 kit 0   . Fluocinolone Acetonide Body 0.01 % OIL APPLY TO FULL BODY DAILY FOR 2 WEEKS. (AVOID FACE, GROIN, UNDERARMS) (Patient not taking: Reported on 10/29/2020)     . glucose blood (ACCU-CHEK GUIDE) test strip Use to check blood sugars four times a day as instructed 100 each 12   . hydrocortisone 2.5 % cream APPLY TO FACE TWICE DAILY FOR 2 WEEKS  (Patient not taking: Reported on 10/29/2020)     . promethazine (PHENERGAN) 25 MG tablet Take 1 tablet (25 mg total) by mouth every 6 (six) hours as needed for nausea or vomiting. 30 tablet 0 More than a month at Unknown time    Review of Systems  Constitutional: Negative for fever.  Respiratory: Negative for cough, shortness of breath and wheezing.   Gastrointestinal: Negative for nausea and vomiting.  Genitourinary: Negative for dysuria, vaginal bleeding and vaginal discharge.  Musculoskeletal: Positive for back pain. Negative for gait problem.       Having pelvic pain - new in this pregnancy   Physical Exam   Blood pressure 135/70, pulse 92, temperature 98.5 F (36.9 C), temperature source Oral, resp. rate 17, height '5\' 3"'  (1.6 m), weight 104.4 kg, last menstrual period 05/08/2020, SpO2 99 %.  Physical Exam Vitals and nursing note reviewed.  Constitutional:      Appearance: She is well-developed.  HENT:     Head: Normocephalic.  Cardiovascular:     Rate and Rhythm: Normal rate and regular rhythm.  Pulmonary:     Effort: Pulmonary effort is normal.     Breath sounds: Normal breath sounds.  Abdominal:     Palpations: Abdomen is soft.     Tenderness: There is no abdominal tenderness. There is no guarding or rebound.  Genitourinary:    Comments: Cervix - long and closed Musculoskeletal:        General: Normal range of motion.     Cervical back: Neck supple.  Skin:    General: Skin is warm and dry.  Neurological:     Mental Status: She is alert and oriented to person, place, and time.   NST - FHT baseline 150 with moderate variability and 15x15 accels noted.  No decelerations.  Occasional contraction Category 1 tracing.  MAU Course  Procedures  MDM Reviewed that worse pelvic pain is normal in a second pregnancy. Encouraged to keep appointment with pelvic PT. Drink at least 8 8-oz glasses of water every day.   Assessment and Plan  Pelvic pain Not in labor - no  cervical change - baby not in the pelvis Gestational diabetes 96w2dReactive NST  Plan Keep your appointments in the office. Keep checking glucose levels as you have been instructed. Return if your symptoms worsen.   TVirginia Rochester5/29/2022, 7:46 PM

## 2020-12-13 NOTE — MAU Note (Signed)
UA collected ans sent prior to discharge

## 2020-12-23 ENCOUNTER — Encounter: Payer: Self-pay | Admitting: Family Medicine

## 2020-12-23 ENCOUNTER — Ambulatory Visit (INDEPENDENT_AMBULATORY_CARE_PROVIDER_SITE_OTHER): Payer: BLUE CROSS/BLUE SHIELD | Admitting: Family Medicine

## 2020-12-23 ENCOUNTER — Other Ambulatory Visit: Payer: Self-pay

## 2020-12-23 VITALS — BP 111/74 | HR 102 | Wt 231.0 lb

## 2020-12-23 DIAGNOSIS — O0991 Supervision of high risk pregnancy, unspecified, first trimester: Secondary | ICD-10-CM

## 2020-12-23 DIAGNOSIS — O99211 Obesity complicating pregnancy, first trimester: Secondary | ICD-10-CM

## 2020-12-23 DIAGNOSIS — O24419 Gestational diabetes mellitus in pregnancy, unspecified control: Secondary | ICD-10-CM

## 2020-12-23 DIAGNOSIS — O34219 Maternal care for unspecified type scar from previous cesarean delivery: Secondary | ICD-10-CM

## 2020-12-23 NOTE — Progress Notes (Signed)
32wks 5d  CC: Gas and abdominal discomfort due to gas. Pt has not tried any OTC medications yet.  Pt did not bring blood sugar log today.

## 2020-12-23 NOTE — Patient Instructions (Signed)
Contraception Choices Contraception, also called birth control, refers to methods or devices that prevent pregnancy. Hormonal methods Contraceptive implant A contraceptive implant is a thin, plastic tube that contains a hormone that prevents pregnancy. It is different from an intrauterine device (IUD). It is inserted into the upper part of the arm by a health care provider. Implants can be effective for up to 3 years. Progestin-only injections Progestin-only injections are injections of progestin, a synthetic form of the hormone progesterone. They are given every 3 months by a health care provider. Birth control pills Birth control pills are pills that contain hormones that prevent pregnancy. They must be taken once a day, preferably at the same time each day. A prescription is needed to use this method of contraception. Birth control patch The birth control patch contains hormones that prevent pregnancy. It is placed on the skin and must be changed once a week for three weeks and removed on the fourth week. A prescription is needed to use this method of contraception. Vaginal ring A vaginal ring contains hormones that prevent pregnancy. It is placed in the vagina for three weeks and removed on the fourth week. After that, the process is repeated with a new ring. A prescription is needed to use this method of contraception. Emergency contraceptive Emergency contraceptives prevent pregnancy after unprotected sex. They come in pill form and can be taken up to 5 days after sex. They work best the sooner they are taken after having sex. Most emergency contraceptives are available without a prescription. This method should not be used as your only form of birth control.   Barrier methods Female condom A female condom is a thin sheath that is worn over the penis during sex. Condoms keep sperm from going inside a woman's body. They can be used with a sperm-killing substance (spermicide) to increase their  effectiveness. They should be thrown away after one use. Female condom A female condom is a soft, loose-fitting sheath that is put into the vagina before sex. The condom keeps sperm from going inside a woman's body. They should be thrown away after one use. Diaphragm A diaphragm is a soft, dome-shaped barrier. It is inserted into the vagina before sex, along with a spermicide. The diaphragm blocks sperm from entering the uterus, and the spermicide kills sperm. A diaphragm should be left in the vagina for 6-8 hours after sex and removed within 24 hours. A diaphragm is prescribed and fitted by a health care provider. A diaphragm should be replaced every 1-2 years, after giving birth, after gaining more than 15 lb (6.8 kg), and after pelvic surgery. Cervical cap A cervical cap is a round, soft latex or plastic cup that fits over the cervix. It is inserted into the vagina before sex, along with spermicide. It blocks sperm from entering the uterus. The cap should be left in place for 6-8 hours after sex and removed within 48 hours. A cervical cap must be prescribed and fitted by a health care provider. It should be replaced every 2 years. Sponge A sponge is a soft, circular piece of polyurethane foam with spermicide in it. The sponge helps block sperm from entering the uterus, and the spermicide kills sperm. To use it, you make it wet and then insert it into the vagina. It should be inserted before sex, left in for at least 6 hours after sex, and removed and thrown away within 30 hours. Spermicides Spermicides are chemicals that kill or block sperm from entering the  cervix and uterus. They can come as a cream, jelly, suppository, foam, or tablet. A spermicide should be inserted into the vagina with an applicator at least 44-81 minutes before sex to allow time for it to work. The process must be repeated every time you have sex. Spermicides do not require a prescription.   Intrauterine  contraception Intrauterine device (IUD) An IUD is a T-shaped device that is put in a woman's uterus. There are two types:  Hormone IUD.This type contains progestin, a synthetic form of the hormone progesterone. This type can stay in place for 3-5 years.  Copper IUD.This type is wrapped in copper wire. It can stay in place for 10 years. Permanent methods of contraception Female tubal ligation In this method, a woman's fallopian tubes are sealed, tied, or blocked during surgery to prevent eggs from traveling to the uterus. Hysteroscopic sterilization In this method, a small, flexible insert is placed into each fallopian tube. The inserts cause scar tissue to form in the fallopian tubes and block them, so sperm cannot reach an egg. The procedure takes about 3 months to be effective. Another form of birth control must be used during those 3 months. Female sterilization This is a procedure to tie off the tubes that carry sperm (vasectomy). After the procedure, the man can still ejaculate fluid (semen). Another form of birth control must be used for 3 months after the procedure. Natural planning methods Natural family planning In this method, a couple does not have sex on days when the woman could become pregnant. Calendar method In this method, the woman keeps track of the length of each menstrual cycle, identifies the days when pregnancy can happen, and does not have sex on those days. Ovulation method In this method, a couple avoids sex during ovulation. Symptothermal method This method involves not having sex during ovulation. The woman typically checks for ovulation by watching changes in her temperature and in the consistency of cervical mucus. Post-ovulation method In this method, a couple waits to have sex until after ovulation. Where to find more information  Centers for Disease Control and Prevention: http://www.wolf.info/ Summary  Contraception, also called birth control, refers to methods or  devices that prevent pregnancy.  Hormonal methods of contraception include implants, injections, pills, patches, vaginal rings, and emergency contraceptives.  Barrier methods of contraception can include female condoms, female condoms, diaphragms, cervical caps, sponges, and spermicides.  There are two types of IUDs (intrauterine devices). An IUD can be put in a woman's uterus to prevent pregnancy for 3-5 years.  Permanent sterilization can be done through a procedure for males and females. Natural family planning methods involve nothaving sex on days when the woman could become pregnant. This information is not intended to replace advice given to you by your health care provider. Make sure you discuss any questions you have with your health care provider. Document Revised: 12/09/2019 Document Reviewed: 12/09/2019 Elsevier Patient Education  [place].   Breastfeeding  Choosing to breastfeed is one of the best decisions you can make for yourself and your baby. A change in hormones during pregnancy causes your breasts to make breast milk in your milk-producing glands. Hormones prevent breast milk from being released before your baby is born. They also prompt milk flow after birth. Once breastfeeding has begun, thoughts of your baby, as well as his or her sucking or crying, can stimulate the release of milk from your milk-producing glands. Benefits of breastfeeding Research shows that breastfeeding offers many health benefits  for infants and mothers. It also offers a cost-free and convenient way to feed your baby. For your baby  Your first milk (colostrum) helps your baby's digestive system to function better.  Special cells in your milk (antibodies) help your baby to fight off infections.  Breastfed babies are less likely to develop asthma, allergies, obesity, or type 2 diabetes. They are also at lower risk for sudden infant death syndrome (SIDS).  Nutrients in breast milk are better  able to meet your baby's needs compared to infant formula.  Breast milk improves your baby's brain development. For you  Breastfeeding helps to create a very special bond between you and your baby.  Breastfeeding is convenient. Breast milk costs nothing and is always available at the correct temperature.  Breastfeeding helps to burn calories. It helps you to lose the weight that you gained during pregnancy.  Breastfeeding makes your uterus return faster to its size before pregnancy. It also slows bleeding (lochia) after you give birth.  Breastfeeding helps to lower your risk of developing type 2 diabetes, osteoporosis, rheumatoid arthritis, cardiovascular disease, and breast, ovarian, uterine, and endometrial cancer later in life. Breastfeeding basics Starting breastfeeding  Find a comfortable place to sit or lie down, with your neck and back well-supported.  Place a pillow or a rolled-up blanket under your baby to bring him or her to the level of your breast (if you are seated). Nursing pillows are specially designed to help support your arms and your baby while you breastfeed.  Make sure that your baby's tummy (abdomen) is facing your abdomen.  Gently massage your breast. With your fingertips, massage from the outer edges of your breast inward toward the nipple. This encourages milk flow. If your milk flows slowly, you may need to continue this action during the feeding.  Support your breast with 4 fingers underneath and your thumb above your nipple (make the letter "C" with your hand). Make sure your fingers are well away from your nipple and your baby's mouth.  Stroke your baby's lips gently with your finger or nipple.  When your baby's mouth is open wide enough, quickly bring your baby to your breast, placing your entire nipple and as much of the areola as possible into your baby's mouth. The areola is the colored area around your nipple. ? More areola should be visible above your  baby's upper lip than below the lower lip. ? Your baby's lips should be opened and extended outward (flanged) to ensure an adequate, comfortable latch. ? Your baby's tongue should be between his or her lower gum and your breast.  Make sure that your baby's mouth is correctly positioned around your nipple (latched). Your baby's lips should create a seal on your breast and be turned out (everted).  It is common for your baby to suck about 2-3 minutes in order to start the flow of breast milk. Latching Teaching your baby how to latch onto your breast properly is very important. An improper latch can cause nipple pain, decreased milk supply, and poor weight gain in your baby. Also, if your baby is not latched onto your nipple properly, he or she may swallow some air during feeding. This can make your baby fussy. Burping your baby when you switch breasts during the feeding can help to get rid of the air. However, teaching your baby to latch on properly is still the best way to prevent fussiness from swallowing air while breastfeeding. Signs that your baby has successfully latched onto  your nipple  Silent tugging or silent sucking, without causing you pain. Infant's lips should be extended outward (flanged).  Swallowing heard between every 3-4 sucks once your milk has started to flow (after your let-down milk reflex occurs).  Muscle movement above and in front of his or her ears while sucking. Signs that your baby has not successfully latched onto your nipple  Sucking sounds or smacking sounds from your baby while breastfeeding.  Nipple pain. If you think your baby has not latched on correctly, slip your finger into the corner of your baby's mouth to break the suction and place it between your baby's gums. Attempt to start breastfeeding again. Signs of successful breastfeeding Signs from your baby  Your baby will gradually decrease the number of sucks or will completely stop sucking.  Your baby  will fall asleep.  Your baby's body will relax.  Your baby will retain a small amount of milk in his or her mouth.  Your baby will let go of your breast by himself or herself. Signs from you  Breasts that have increased in firmness, weight, and size 1-3 hours after feeding.  Breasts that are softer immediately after breastfeeding.  Increased milk volume, as well as a change in milk consistency and color by the fifth day of breastfeeding.  Nipples that are not sore, cracked, or bleeding. Signs that your baby is getting enough milk  Wetting at least 1-2 diapers during the first 24 hours after birth.  Wetting at least 5-6 diapers every 24 hours for the first week after birth. The urine should be clear or pale yellow by the age of 5 days.  Wetting 6-8 diapers every 24 hours as your baby continues to grow and develop.  At least 3 stools in a 24-hour period by the age of 5 days. The stool should be soft and yellow.  At least 3 stools in a 24-hour period by the age of 7 days. The stool should be seedy and yellow.  No loss of weight greater than 10% of birth weight during the first 3 days of life.  Average weight gain of 4-7 oz (113-198 g) per week after the age of 4 days.  Consistent daily weight gain by the age of 5 days, without weight loss after the age of 2 weeks. After a feeding, your baby may spit up a small amount of milk. This is normal. Breastfeeding frequency and duration Frequent feeding will help you make more milk and can prevent sore nipples and extremely full breasts (breast engorgement). Breastfeed when you feel the need to reduce the fullness of your breasts or when your baby shows signs of hunger. This is called "breastfeeding on demand." Signs that your baby is hungry include:  Increased alertness, activity, or restlessness.  Movement of the head from side to side.  Opening of the mouth when the corner of the mouth or cheek is stroked (rooting).  Increased  sucking sounds, smacking lips, cooing, sighing, or squeaking.  Hand-to-mouth movements and sucking on fingers or hands.  Fussing or crying. Avoid introducing a pacifier to your baby in the first 4-6 weeks after your baby is born. After this time, you may choose to use a pacifier. Research has shown that pacifier use during the first year of a baby's life decreases the risk of sudden infant death syndrome (SIDS). Allow your baby to feed on each breast as long as he or she wants. When your baby unlatches or falls asleep while feeding from the  first breast, offer the second breast. Because newborns are often sleepy in the first few weeks of life, you may need to awaken your baby to get him or her to feed. Breastfeeding times will vary from baby to baby. However, the following rules can serve as a guide to help you make sure that your baby is properly fed:  Newborns (babies 7 weeks of age or younger) may breastfeed every 1-3 hours.  Newborns should not go without breastfeeding for longer than 3 hours during the day or 5 hours during the night.  You should breastfeed your baby a minimum of 8 times in a 24-hour period. Breast milk pumping Pumping and storing breast milk allows you to make sure that your baby is exclusively fed your breast milk, even at times when you are unable to breastfeed. This is especially important if you go back to work while you are still breastfeeding, or if you are not able to be present during feedings. Your lactation consultant can help you find a method of pumping that works best for you and give you guidelines about how long it is safe to store breast milk.      Caring for your breasts while you breastfeed Nipples can become dry, cracked, and sore while breastfeeding. The following recommendations can help keep your breasts moisturized and healthy:  Avoid using soap on your nipples.  Wear a supportive bra designed especially for nursing. Avoid wearing underwire-style  bras or extremely tight bras (sports bras).  Air-dry your nipples for 3-4 minutes after each feeding.  Use only cotton bra pads to absorb leaked breast milk. Leaking of breast milk between feedings is normal.  Use lanolin on your nipples after breastfeeding. Lanolin helps to maintain your skin's normal moisture barrier. Pure lanolin is not harmful (not toxic) to your baby. You may also hand express a few drops of breast milk and gently massage that milk into your nipples and allow the milk to air-dry. In the first few weeks after giving birth, some women experience breast engorgement. Engorgement can make your breasts feel heavy, warm, and tender to the touch. Engorgement peaks within 3-5 days after you give birth. The following recommendations can help to ease engorgement:  Completely empty your breasts while breastfeeding or pumping. You may want to start by applying warm, moist heat (in the shower or with warm, water-soaked hand towels) just before feeding or pumping. This increases circulation and helps the milk flow. If your baby does not completely empty your breasts while breastfeeding, pump any extra milk after he or she is finished.  Apply ice packs to your breasts immediately after breastfeeding or pumping, unless this is too uncomfortable for you. To do this: ? Put ice in a plastic bag. ? Place a towel between your skin and the bag. ? Leave the ice on for 20 minutes, 2-3 times a day.  Make sure that your baby is latched on and positioned properly while breastfeeding. If engorgement persists after 48 hours of following these recommendations, contact your health care provider or a Science writer. Overall health care recommendations while breastfeeding  Eat 3 healthy meals and 3 snacks every day. Well-nourished mothers who are breastfeeding need an additional 450-500 calories a day. You can meet this requirement by increasing the amount of a balanced diet that you eat.  Drink  enough water to keep your urine pale yellow or clear.  Rest often, relax, and continue to take your prenatal vitamins to prevent fatigue, stress, and low  vitamin and mineral levels in your body (nutrient deficiencies).  Do not use any products that contain nicotine or tobacco, such as cigarettes and e-cigarettes. Your baby may be harmed by chemicals from cigarettes that pass into breast milk and exposure to secondhand smoke. If you need help quitting, ask your health care provider.  Avoid alcohol.  Do not use illegal drugs or marijuana.  Talk with your health care provider before taking any medicines. These include over-the-counter and prescription medicines as well as vitamins and herbal supplements. Some medicines that may be harmful to your baby can pass through breast milk.  It is possible to become pregnant while breastfeeding. If birth control is desired, ask your health care provider about options that will be safe while breastfeeding your baby. Where to find more information: [employer]: www.llli.org Contact a health care provider if:  You feel like you want to stop breastfeeding or have become frustrated with breastfeeding.  Your nipples are cracked or bleeding.  Your breasts are red, tender, or warm.  You have: ? Painful breasts or nipples. ? A swollen area on either breast. ? A fever or chills. ? Nausea or vomiting. ? Drainage other than breast milk from your nipples.  Your breasts do not become full before feedings by the fifth day after you give birth.  You feel sad and depressed.  Your baby is: ? Too sleepy to eat well. ? Having trouble sleeping. ? More than 64 week old and wetting fewer than 6 diapers in a 24-hour period. ? Not gaining weight by 52 days of age.  Your baby has fewer than 3 stools in a 24-hour period.  Your baby's skin or the white parts of his or her eyes become yellow. Get help right away if:  Your baby is overly tired  (lethargic) and does not want to wake up and feed.  Your baby develops an unexplained fever. Summary  Breastfeeding offers many health benefits for infant and mothers.  Try to breastfeed your infant when he or she shows early signs of hunger.  Gently tickle or stroke your baby's lips with your finger or nipple to allow the baby to open his or her mouth. Bring the baby to your breast. Make sure that much of the areola is in your baby's mouth. Offer one side and burp the baby before you offer the other side.  Talk with your health care provider or lactation consultant if you have questions or you face problems as you breastfeed. This information is not intended to replace advice given to you by your health care provider. Make sure you discuss any questions you have with your health care provider. Document Revised: 09/28/2017 Document Reviewed: 08/05/2016 Elsevier Patient Education  2021 [employer].

## 2020-12-23 NOTE — Progress Notes (Signed)
  Patient ID: Susan Hubbard, female   DOB: 04-28-1991, 30 y.o.   MRN: [id number]    Subjective:  Susan Hubbard is a 30 y.o. [id number] at [redacted]w[redacted]d being seen today for ongoing prenatal care.  She is currently monitored for the following issues for this high-risk pregnancy and has Vitamin D deficiency; Pure hypercholesterolemia; Kidney stones; Sleep disturbance; Acne; Eczema; History of PCOS; Supervision of high risk pregnancy in first trimester; Obesity affecting pregnancy in first trimester; H/O cesarean section complicating pregnancy; and Gestational diabetes mellitus (GDM), antepartum on their problem list.  Patient reports gas.  Contractions: Irritability. Vag. Bleeding: None.  Movement: Present. Denies leaking of fluid.   The following portions of the patient's history were reviewed and updated as appropriate: allergies, current medications, past family history, past medical history, past social history, past surgical history and problem list. Problem list updated.  Objective:   Vitals:   12/23/20 1045  BP: 111/74  Pulse: (!) 102  Weight: 231 lb (104.8 kg)    Fetal Status: Fetal Heart Rate (bpm): 156   Movement: Present     General:  Alert, oriented and cooperative. Patient is in no acute distress.  Skin: Skin is warm and dry. No rash noted.   Cardiovascular: Normal heart rate noted  Respiratory: Normal respiratory effort, no problems with respiration noted  Abdomen: Soft, gravid, appropriate for gestational age. Pain/Pressure: Present     Pelvic: Vag. Bleeding: None     Cervical exam deferred        Extremities: Normal range of motion.  Edema: Trace  Mental Status: Normal mood and affect. Normal behavior. Normal judgment and thought content.   Urinalysis:      Assessment and Plan:  Pregnancy: [id number] at [redacted]w[redacted]d  1. Supervision of high risk pregnancy in first trimester BP and FHR normal  2. Gestational diabetes mellitus (GDM), antepartum, gestational diabetes method of control  unspecified Did not bring sugar log today From recall fastings are 80-90's Highest post prandial was 140's, normally in 120's Reviewed risks of GDM She will send picture of log through mychart and bring log to next visit  3. Obesity affecting pregnancy in first trimester   4. H/O cesarean section complicating pregnancy Desires VBAC Consent signed today  Preterm labor symptoms and general obstetric precautions including but not limited to vaginal bleeding, contractions, leaking of fluid and fetal movement were reviewed in detail with the patient. Please refer to After Visit Summary for other counseling recommendations.  Return in 2 weeks (on 01/06/2021) for [facility], ob visit, needs MD.   Clarnce Flock, MD

## 2020-12-30 ENCOUNTER — Other Ambulatory Visit: Payer: Self-pay

## 2020-12-30 ENCOUNTER — Ambulatory Visit (INDEPENDENT_AMBULATORY_CARE_PROVIDER_SITE_OTHER): Payer: BLUE CROSS/BLUE SHIELD

## 2020-12-30 ENCOUNTER — Ambulatory Visit: Payer: BLUE CROSS/BLUE SHIELD | Admitting: *Deleted

## 2020-12-30 VITALS — BP 106/48 | HR 98 | Wt 230.9 lb

## 2020-12-30 DIAGNOSIS — O99213 Obesity complicating pregnancy, third trimester: Secondary | ICD-10-CM

## 2020-12-30 DIAGNOSIS — O24419 Gestational diabetes mellitus in pregnancy, unspecified control: Secondary | ICD-10-CM

## 2020-12-30 NOTE — Progress Notes (Signed)

## 2021-01-06 ENCOUNTER — Other Ambulatory Visit: Payer: Self-pay

## 2021-01-06 ENCOUNTER — Ambulatory Visit (INDEPENDENT_AMBULATORY_CARE_PROVIDER_SITE_OTHER): Payer: BLUE CROSS/BLUE SHIELD | Admitting: Obstetrics and Gynecology

## 2021-01-06 ENCOUNTER — Encounter: Payer: Self-pay | Admitting: *Deleted

## 2021-01-06 ENCOUNTER — Ambulatory Visit: Payer: BLUE CROSS/BLUE SHIELD | Admitting: *Deleted

## 2021-01-06 ENCOUNTER — Ambulatory Visit: Payer: BLUE CROSS/BLUE SHIELD | Attending: Obstetrics

## 2021-01-06 ENCOUNTER — Other Ambulatory Visit: Payer: Self-pay | Admitting: *Deleted

## 2021-01-06 VITALS — BP 122/72 | HR 81

## 2021-01-06 VITALS — BP 115/79 | HR 97 | Wt 228.0 lb

## 2021-01-06 DIAGNOSIS — O0991 Supervision of high risk pregnancy, unspecified, first trimester: Secondary | ICD-10-CM | POA: Diagnosis present

## 2021-01-06 DIAGNOSIS — E669 Obesity, unspecified: Secondary | ICD-10-CM

## 2021-01-06 DIAGNOSIS — O24410 Gestational diabetes mellitus in pregnancy, diet controlled: Secondary | ICD-10-CM | POA: Diagnosis not present

## 2021-01-06 DIAGNOSIS — O0993 Supervision of high risk pregnancy, unspecified, third trimester: Secondary | ICD-10-CM

## 2021-01-06 DIAGNOSIS — Z8742 Personal history of other diseases of the female genital tract: Secondary | ICD-10-CM

## 2021-01-06 DIAGNOSIS — O34219 Maternal care for unspecified type scar from previous cesarean delivery: Secondary | ICD-10-CM

## 2021-01-06 DIAGNOSIS — O99213 Obesity complicating pregnancy, third trimester: Secondary | ICD-10-CM | POA: Diagnosis not present

## 2021-01-06 DIAGNOSIS — O24419 Gestational diabetes mellitus in pregnancy, unspecified control: Secondary | ICD-10-CM | POA: Diagnosis not present

## 2021-01-06 DIAGNOSIS — Z3A34 34 weeks gestation of pregnancy: Secondary | ICD-10-CM | POA: Diagnosis not present

## 2021-01-06 NOTE — Progress Notes (Signed)
+   Fetal movement. Pt states blood sugars are running ok. Blood sugar logs to review.

## 2021-01-06 NOTE — Progress Notes (Signed)
   PRENATAL VISIT NOTE  Subjective:  Susan Hubbard is a 30 y.o. P6P9509 at [redacted]w[redacted]d being seen today for ongoing prenatal care.  She is currently monitored for the following issues for this high-risk pregnancy and has Vitamin D deficiency; Pure hypercholesterolemia; Kidney stones; Sleep disturbance; Acne; Eczema; History of PCOS; Supervision of high risk pregnancy, antepartum, third trimester; Obesity affecting pregnancy in first trimester; H/O cesarean section complicating pregnancy; Gestational diabetes mellitus (GDM), antepartum; and [redacted] weeks gestation of pregnancy on their problem list.  Patient doing well with no acute concerns today. She reports no complaints.  Contractions: Irritability. Vag. Bleeding: None.  Movement: Present. Denies leaking of fluid.   FBS: 80-90 PPBS: 120-130s  The following portions of the patient's history were reviewed and updated as appropriate: allergies, current medications, past family history, past medical history, past social history, past surgical history and problem list. Problem list updated.  Objective:   Vitals:   01/06/21 1436  BP: 115/79  Pulse: 97  Weight: 228 lb (103.4 kg)    Fetal Status: Fetal Heart Rate (bpm): 152 Fundal Height: 36 cm Movement: Present     General:  Alert, oriented and cooperative. Patient is in no acute distress.  Skin: Skin is warm and dry. No rash noted.   Cardiovascular: Normal heart rate noted  Respiratory: Normal respiratory effort, no problems with respiration noted  Abdomen: Soft, gravid, appropriate for gestational age.  Pain/Pressure: Absent     Pelvic: Cervical exam deferred        Extremities: Normal range of motion.  Edema: Trace  Mental Status:  Normal mood and affect. Normal behavior. Normal judgment and thought content.   Assessment and Plan:  Pregnancy: T2I7124 at [redacted]w[redacted]d  1. [redacted] weeks gestation of pregnancy   2. Diet controlled gestational diabetes mellitus (GDM), antepartum Moderate control of  blood sugar, glucose monitoring sheets given, continue with diet control  3. H/O cesarean section complicating pregnancy Pt desires VBAC  4. History of PCOS   5. Supervision of high risk pregnancy, antepartum, third trimester Continue routine care, 36 week labs next visit  Preterm labor symptoms and general obstetric precautions including but not limited to vaginal bleeding, contractions, leaking of fluid and fetal movement were reviewed in detail with the patient.  Please refer to After Visit Summary for other counseling recommendations.   Return in about 1 week (around 01/13/2021) for [facility], in person, 36 weeks swabs.   Lynnda Shields, MD Faculty Attending Center for [facility]

## 2021-01-13 ENCOUNTER — Ambulatory Visit (INDEPENDENT_AMBULATORY_CARE_PROVIDER_SITE_OTHER): Payer: BLUE CROSS/BLUE SHIELD | Admitting: Women's Health

## 2021-01-13 ENCOUNTER — Other Ambulatory Visit: Payer: Self-pay

## 2021-01-13 ENCOUNTER — Other Ambulatory Visit (HOSPITAL_COMMUNITY)
Admission: RE | Admit: 2021-01-13 | Discharge: 2021-01-13 | Disposition: A | Discharge status: Home / Self Care | Payer: BLUE CROSS/BLUE SHIELD | Source: Ambulatory Visit | Attending: Family Medicine | Admitting: Family Medicine

## 2021-01-13 ENCOUNTER — Ambulatory Visit (INDEPENDENT_AMBULATORY_CARE_PROVIDER_SITE_OTHER): Payer: BLUE CROSS/BLUE SHIELD

## 2021-01-13 ENCOUNTER — Ambulatory Visit (INDEPENDENT_AMBULATORY_CARE_PROVIDER_SITE_OTHER): Payer: BLUE CROSS/BLUE SHIELD | Admitting: General Practice

## 2021-01-13 VITALS — BP 133/82 | HR 89

## 2021-01-13 VITALS — BP 121/85 | HR 102 | Wt 230.0 lb

## 2021-01-13 DIAGNOSIS — O24410 Gestational diabetes mellitus in pregnancy, diet controlled: Secondary | ICD-10-CM

## 2021-01-13 DIAGNOSIS — O34219 Maternal care for unspecified type scar from previous cesarean delivery: Secondary | ICD-10-CM

## 2021-01-13 DIAGNOSIS — O0993 Supervision of high risk pregnancy, unspecified, third trimester: Secondary | ICD-10-CM

## 2021-01-13 DIAGNOSIS — Z3A35 35 weeks gestation of pregnancy: Secondary | ICD-10-CM

## 2021-01-13 DIAGNOSIS — O99211 Obesity complicating pregnancy, first trimester: Secondary | ICD-10-CM

## 2021-01-13 NOTE — Progress Notes (Signed)
Pt informed that the ultrasound is considered a limited OB ultrasound and is not intended to be a complete ultrasound exam.  Patient also informed that the ultrasound is not being completed with the intent of assessing for fetal or placental anomalies or any pelvic abnormalities.  Explained that the purpose of today's ultrasound is to assess for  BPP, presentation, and AFI.  Patient acknowledges the purpose of the exam and the limitations of the study.     Koren Bound RN BSN 01/13/21

## 2021-01-13 NOTE — Progress Notes (Signed)
Subjective:  Susan Hubbard is a 30 y.o. L4Y5035 at 74w5dbeing seen today for ongoing prenatal care.  She is currently monitored for the following issues for this high-risk pregnancy and has Vitamin D deficiency; Pure hypercholesterolemia; Kidney stones; Sleep disturbance; Acne; Eczema; History of PCOS; Supervision of high risk pregnancy, antepartum, third trimester; Obesity affecting pregnancy in first trimester; H/O cesarean section complicating pregnancy; and Gestational diabetes mellitus (GDM), antepartum on their problem list.  Patient reports no complaints.  Contractions: Irregular. Vag. Bleeding: None.  Movement: Present. Denies leaking of fluid.   The following portions of the patient's history were reviewed and updated as appropriate: allergies, current medications, past family history, past medical history, past social history, past surgical history and problem list. Problem list updated.  Objective:   Vitals:   01/13/21 1553  BP: 121/85  Pulse: (!) 102  Weight: 230 lb (104.3 kg)    Fetal Status: Fetal Heart Rate (bpm): 160   Movement: Present     General:  Alert, oriented and cooperative. Patient is in no acute distress.  Skin: Skin is warm and dry. No rash noted.   Cardiovascular: Normal heart rate noted  Respiratory: Normal respiratory effort, no problems with respiration noted  Abdomen: Soft, gravid, appropriate for gestational age. Pain/Pressure: Present     Pelvic: Vag. Bleeding: None     Cervical exam deferred        Extremities: Normal range of motion.     Mental Status: Normal mood and affect. Normal behavior. Normal judgment and thought content.   Urinalysis:      Assessment and Plan:  Pregnancy: G5P1031 at 333w5d1. Supervision of high risk pregnancy, antepartum, third trimester - Strep Gp B NAA - Cervicovaginal ancillary only( [place])  2. H/O cesarean section complicating pregnancy - pt wishes to meet with MD next visit to discuss possible rC/S,  unsure about VBAC at this time  3. Obesity affecting pregnancy in first trimester - Comp Met (CMET) - Protein / creatinine ratio, urine  3. GDM - diet controlled GDM, some elevated postprandial numbers, but patient has written in her log that she had foods like coke or cupcakes at the time of those elevated numbers. Patient advised needs to improve diet for next week and may need to be started on medication if numbers don't improve. Patient verbalizes understanding and agrees with plan.  Term labor symptoms and general obstetric precautions including but not limited to vaginal bleeding, contractions, leaking of fluid and fetal movement were reviewed in detail with the patient. I discussed the assessment and treatment plan with the patient. The patient was provided an opportunity to ask questions and all were answered. The patient agreed with the plan and demonstrated an understanding of the instructions. The patient was advised to call back or seek an in-person office evaluation[facility]. Please refer to After Visit Summary for other counseling recommendations.  Return in about 1 week (around 01/20/2021) for in-person HOB/MD ONLY, discuss rC/S.   Jojo Pehl, NiGerrie NordmannNP

## 2021-01-14 LAB — COMPREHENSIVE METABOLIC PANEL
ALT: 10 IU/L (ref 0–32)
AST: 10 IU/L (ref 0–40)
Albumin/Globulin Ratio: 1.4 (ref 1.2–2.2)
Albumin: 3.6 g/dL — ABNORMAL LOW (ref 3.9–5.0)
Alkaline Phosphatase: 116 IU/L (ref 44–121)
BUN/Creatinine Ratio: 10 (ref 9–23)
BUN: 7 mg/dL (ref 6–20)
Bilirubin Total: <0.2 mg/dL (ref 0.0–1.2)
CO2: 21 mmol/L (ref 20–29)
Calcium: 9.1 mg/dL (ref 8.7–10.2)
Chloride: 104 mmol/L (ref 96–106)
Creatinine, Ser: 0.69 mg/dL (ref 0.57–1.00)
Globulin, Total: 2.5 g/dL (ref 1.5–4.5)
Glucose: 102 mg/dL — ABNORMAL HIGH (ref 65–99)
Potassium: 4.2 mmol/L (ref 3.5–5.2)
Sodium: 139 mmol/L (ref 134–144)
Total Protein: 6.1 g/dL (ref 6.0–8.5)
eGFR: 120 mL/min/{1.73_m2} (ref >59)

## 2021-01-14 LAB — PROTEIN / CREATININE RATIO, URINE
Creatinine, Urine: 263 mg/dL
Protein, Ur: 25.6 mg/dL
Protein/Creat Ratio: 97 mg/g creat (ref 0–200)

## 2021-01-15 LAB — CERVICOVAGINAL ANCILLARY ONLY
Chlamydia: NEGATIVE
Comment: NEGATIVE
Comment: NORMAL
Neisseria Gonorrhea: NEGATIVE

## 2021-01-15 LAB — STREP GP B NAA: Strep Gp B NAA: NEGATIVE

## 2021-01-19 ENCOUNTER — Other Ambulatory Visit: Payer: Self-pay

## 2021-01-19 ENCOUNTER — Ambulatory Visit: Payer: BLUE CROSS/BLUE SHIELD | Admitting: *Deleted

## 2021-01-19 ENCOUNTER — Ambulatory Visit (INDEPENDENT_AMBULATORY_CARE_PROVIDER_SITE_OTHER): Payer: BLUE CROSS/BLUE SHIELD

## 2021-01-19 DIAGNOSIS — O99211 Obesity complicating pregnancy, first trimester: Secondary | ICD-10-CM

## 2021-01-19 NOTE — Progress Notes (Signed)

## 2021-01-21 ENCOUNTER — Other Ambulatory Visit: Payer: Self-pay

## 2021-01-21 ENCOUNTER — Encounter: Payer: BLUE CROSS/BLUE SHIELD | Admitting: Obstetrics & Gynecology

## 2021-01-21 ENCOUNTER — Ambulatory Visit (INDEPENDENT_AMBULATORY_CARE_PROVIDER_SITE_OTHER): Payer: BLUE CROSS/BLUE SHIELD | Admitting: Obstetrics & Gynecology

## 2021-01-21 VITALS — BP 112/77 | HR 112 | Wt 231.4 lb

## 2021-01-21 DIAGNOSIS — O24410 Gestational diabetes mellitus in pregnancy, diet controlled: Secondary | ICD-10-CM

## 2021-01-21 DIAGNOSIS — O34219 Maternal care for unspecified type scar from previous cesarean delivery: Secondary | ICD-10-CM

## 2021-01-21 DIAGNOSIS — D392 Neoplasm of uncertain behavior of placenta: Secondary | ICD-10-CM

## 2021-01-21 DIAGNOSIS — O0993 Supervision of high risk pregnancy, unspecified, third trimester: Secondary | ICD-10-CM

## 2021-01-21 NOTE — Progress Notes (Signed)
Pt reports fetal movement with some contractions and pressure.

## 2021-01-21 NOTE — Progress Notes (Signed)
   PRENATAL VISIT NOTE  Subjective:  Susan Hubbard is a 30 y.o. L5Q4920 at [redacted]w[redacted]d being seen today for ongoing prenatal care.  She is currently monitored for the following issues for this high-risk pregnancy and has Vitamin D deficiency; Pure hypercholesterolemia; Kidney stones; Sleep disturbance; Acne; Eczema; History of PCOS; Placental site nodule; Supervision of high risk pregnancy, antepartum, third trimester; Obesity affecting pregnancy in first trimester; H/O cesarean section complicating pregnancy; and Gestational diabetes mellitus (GDM), antepartum on their problem list.  Patient reports no complaints.  Contractions: Irregular. Vag. Bleeding: None.  Movement: Present. Denies leaking of fluid.   The following portions of the patient's history were reviewed and updated as appropriate: allergies, current medications, past family history, past medical history, past social history, past surgical history and problem list.   Objective:   Vitals:   01/21/21 1449  BP: 112/77  Pulse: (!) 112  Weight: 231 lb 6.4 oz (105 kg)    Fetal Status: Fetal Heart Rate (bpm): 153   Movement: Present     General:  Alert, oriented and cooperative. Patient is in no acute distress.  Skin: Skin is warm and dry. No rash noted.   Cardiovascular: Normal heart rate noted  Respiratory: Normal respiratory effort, no problems with respiration noted  Abdomen: Soft, gravid, appropriate for gestational age.  Pain/Pressure: Present     Pelvic: Cervical exam deferred        Extremities: Normal range of motion.  Edema: Mild pitting, slight indentation  Mental Status: Normal mood and affect. Normal behavior. Normal judgment and thought content.   Assessment and Plan:  Pregnancy: F0O7121 at [redacted]w[redacted]d 1. Supervision of high risk pregnancy, antepartum, third trimester Has weekly surveillance  2. Diet controlled gestational diabetes mellitus (GDM), antepartum FBS >80% in rang PP all below 140 39 week delivery still is  acceptable 3. H/O cesarean section complicating pregnancy Still plans TOLAC but this was discussed again  4. Placental site nodule Resolved from 2017  Preterm labor symptoms and general obstetric precautions including but not limited to vaginal bleeding, contractions, leaking of fluid and fetal movement were reviewed in detail with the patient. Please refer to After Visit Summary for other counseling recommendations.   Return in about 1 week (around 01/28/2021).  Future Appointments  Date Time Provider [place]  01/27/2021  3:45 PM Clarnce Flock, MD [place] None  01/28/2021  3:30 PM [facility] NURSE [facility]  01/28/2021  3:45 PM [facility] US5 [facility]  02/03/2021  8:15 AM [facility] NST [facility]    Emeterio Reeve, MD

## 2021-01-27 ENCOUNTER — Other Ambulatory Visit: Payer: Self-pay

## 2021-01-27 ENCOUNTER — Encounter: Payer: Self-pay | Admitting: Family Medicine

## 2021-01-27 ENCOUNTER — Other Ambulatory Visit: Payer: BLUE CROSS/BLUE SHIELD

## 2021-01-27 ENCOUNTER — Ambulatory Visit (INDEPENDENT_AMBULATORY_CARE_PROVIDER_SITE_OTHER): Payer: BLUE CROSS/BLUE SHIELD | Admitting: Family Medicine

## 2021-01-27 VITALS — BP 117/80 | HR 92 | Wt 233.0 lb

## 2021-01-27 DIAGNOSIS — O34219 Maternal care for unspecified type scar from previous cesarean delivery: Secondary | ICD-10-CM

## 2021-01-27 DIAGNOSIS — O0993 Supervision of high risk pregnancy, unspecified, third trimester: Secondary | ICD-10-CM

## 2021-01-27 DIAGNOSIS — O24410 Gestational diabetes mellitus in pregnancy, diet controlled: Secondary | ICD-10-CM

## 2021-01-27 NOTE — Progress Notes (Signed)
   Subjective:  Susan Hubbard is a 30 y.o. Z6X0960 at [redacted]w[redacted]d being seen today for ongoing prenatal care.  She is currently monitored for the following issues for this high-risk pregnancy and has Vitamin D deficiency; Pure hypercholesterolemia; Kidney stones; Sleep disturbance; Acne; Eczema; History of PCOS; Placental site nodule; Supervision of high risk pregnancy, antepartum, third trimester; Obesity affecting pregnancy in first trimester; H/O cesarean section complicating pregnancy; and Gestational diabetes mellitus (GDM), antepartum on their problem list.  Patient reports no complaints.  Contractions: Irregular. Vag. Bleeding: None.  Movement: Present. Denies leaking of fluid.   The following portions of the patient's history were reviewed and updated as appropriate: allergies, current medications, past family history, past medical history, past social history, past surgical history and problem list. Problem list updated.  Objective:   Vitals:   01/27/21 1511  BP: 117/80  Pulse: 92  Weight: 233 lb (105.7 kg)    Fetal Status: Fetal Heart Rate (bpm): 152   Movement: Present     General:  Alert, oriented and cooperative. Patient is in no acute distress.  Skin: Skin is warm and dry. No rash noted.   Cardiovascular: Normal heart rate noted  Respiratory: Normal respiratory effort, no problems with respiration noted  Abdomen: Soft, gravid, appropriate for gestational age. Pain/Pressure: Present     Pelvic: Vag. Bleeding: None     Cervical exam deferred        Extremities: Normal range of motion.  Edema: Mild pitting, slight indentation  Mental Status: Normal mood and affect. Normal behavior. Normal judgment and thought content.   Urinalysis:      Assessment and Plan:  Pregnancy: G5P1031 at [redacted]w[redacted]d  1. Supervision of high risk pregnancy, antepartum, third trimester BP and FHR normal IOL scheduled for [redacted]w[redacted]d after reviewing the schedule Orders placed  2. Diet controlled gestational  diabetes mellitus (GDM), antepartum Sugars are well controlled per recall (did not bring log in) IOL as above Cont weekly testing per MFM  3. H/O cesarean section complicating pregnancy Desires TOLAC  Term labor symptoms and general obstetric precautions including but not limited to vaginal bleeding, contractions, leaking of fluid and fetal movement were reviewed in detail with the patient. Please refer to After Visit Summary for other counseling recommendations.  Return in 1 week (on 02/03/2021) for [facility], ob visit.   Clarnce Flock, MD

## 2021-01-27 NOTE — Progress Notes (Signed)
+   Fetal movement. Pt c/o increased cramping. Pt states glucose levels are staying consistent.

## 2021-01-27 NOTE — Patient Instructions (Signed)

## 2021-01-28 ENCOUNTER — Encounter: Payer: Self-pay | Admitting: *Deleted

## 2021-01-28 ENCOUNTER — Encounter: Payer: Self-pay | Admitting: Family Medicine

## 2021-01-28 ENCOUNTER — Ambulatory Visit: Payer: BLUE CROSS/BLUE SHIELD | Admitting: *Deleted

## 2021-01-28 ENCOUNTER — Ambulatory Visit: Payer: BLUE CROSS/BLUE SHIELD | Attending: Obstetrics and Gynecology

## 2021-01-28 VITALS — BP 125/70 | HR 97

## 2021-01-28 DIAGNOSIS — O0993 Supervision of high risk pregnancy, unspecified, third trimester: Secondary | ICD-10-CM | POA: Insufficient documentation

## 2021-01-28 DIAGNOSIS — O24410 Gestational diabetes mellitus in pregnancy, diet controlled: Secondary | ICD-10-CM

## 2021-01-28 DIAGNOSIS — O24419 Gestational diabetes mellitus in pregnancy, unspecified control: Secondary | ICD-10-CM | POA: Diagnosis present

## 2021-01-28 DIAGNOSIS — Z362 Encounter for other antenatal screening follow-up: Secondary | ICD-10-CM

## 2021-01-28 DIAGNOSIS — O34219 Maternal care for unspecified type scar from previous cesarean delivery: Secondary | ICD-10-CM

## 2021-01-28 DIAGNOSIS — Z3A37 37 weeks gestation of pregnancy: Secondary | ICD-10-CM

## 2021-01-28 DIAGNOSIS — O99213 Obesity complicating pregnancy, third trimester: Secondary | ICD-10-CM

## 2021-01-28 DIAGNOSIS — E669 Obesity, unspecified: Secondary | ICD-10-CM

## 2021-02-02 ENCOUNTER — Other Ambulatory Visit: Payer: Self-pay

## 2021-02-02 ENCOUNTER — Ambulatory Visit (INDEPENDENT_AMBULATORY_CARE_PROVIDER_SITE_OTHER): Payer: BLUE CROSS/BLUE SHIELD | Admitting: Obstetrics and Gynecology

## 2021-02-02 ENCOUNTER — Telehealth: Payer: Self-pay

## 2021-02-02 VITALS — BP 122/78 | HR 90 | Wt 235.0 lb

## 2021-02-02 DIAGNOSIS — O0993 Supervision of high risk pregnancy, unspecified, third trimester: Secondary | ICD-10-CM

## 2021-02-02 DIAGNOSIS — O24410 Gestational diabetes mellitus in pregnancy, diet controlled: Secondary | ICD-10-CM

## 2021-02-02 DIAGNOSIS — O34219 Maternal care for unspecified type scar from previous cesarean delivery: Secondary | ICD-10-CM

## 2021-02-02 DIAGNOSIS — Z3A38 38 weeks gestation of pregnancy: Secondary | ICD-10-CM

## 2021-02-02 NOTE — Telephone Encounter (Signed)
Called patient, surgery date, time and preop instructions given. Patient expressed understanding.

## 2021-02-02 NOTE — Progress Notes (Signed)
+   Fetal movement. Pt states blood sugars are still running consistent.

## 2021-02-02 NOTE — Progress Notes (Signed)
   PRENATAL VISIT NOTE  Subjective:  Susan Hubbard is a 30 y.o. W1X9147 at [redacted]w[redacted]d being seen today for ongoing prenatal care.  She is currently monitored for the following issues for this high-risk pregnancy and has Vitamin D deficiency; Pure hypercholesterolemia; Kidney stones; Sleep disturbance; Acne; Eczema; History of PCOS; Placental site nodule; Supervision of high risk pregnancy, antepartum, third trimester; Obesity affecting pregnancy in first trimester; H/O cesarean section complicating pregnancy; Gestational diabetes mellitus (GDM), antepartum; and [redacted] weeks gestation of pregnancy on their problem list.  Patient doing well with no acute concerns today. She reports no complaints.  Contractions: Irregular. Vag. Bleeding: None.  Movement: Present. Denies leaking of fluid.   Pt was initially scheduled for IOL on 7/24 with VBAC.  Pt has changed her mind and now desires repeat c section.  Risks and benefits given again.  Will post and pt will be contacted with delivery date.   The following portions of the patient's history were reviewed and updated as appropriate: allergies, current medications, past family history, past medical history, past social history, past surgical history and problem list. Problem list updated.  Objective:   Vitals:   02/02/21 1358  BP: 122/78  Pulse: 90  Weight: 235 lb (106.6 kg)    Fetal Status: Fetal Heart Rate (bpm): 152 Fundal Height: 38 cm Movement: Present     General:  Alert, oriented and cooperative. Patient is in no acute distress.  Skin: Skin is warm and dry. No rash noted.   Cardiovascular: Normal heart rate noted  Respiratory: Normal respiratory effort, no problems with respiration noted  Abdomen: Soft, gravid, appropriate for gestational age.  Pain/Pressure: Present     Pelvic: Cervical exam deferred        Extremities: Normal range of motion.  Edema: Mild pitting, slight indentation  Mental Status:  Normal mood and affect. Normal behavior.  Normal judgment and thought content.   Assessment and Plan:  Pregnancy: W2N5621 at [redacted]w[redacted]d  1. [redacted] weeks gestation of pregnancy   2. Supervision of high risk pregnancy, antepartum, third trimester Will schedule pt for repeat c/s  3. H/O cesarean section complicating pregnancy   4. Diet controlled gestational diabetes mellitus (GDM), antepartum Good blood sugar control per pt  Term labor symptoms and general obstetric precautions including but not limited to vaginal bleeding, contractions, leaking of fluid and fetal movement were reviewed in detail with the patient.  Please refer to After Visit Summary for other counseling recommendations.   No follow-ups on file.   Lynnda Shields, MD Faculty Attending Center for [facility]

## 2021-02-03 ENCOUNTER — Encounter (HOSPITAL_COMMUNITY): Payer: Self-pay

## 2021-02-03 ENCOUNTER — Ambulatory Visit: Payer: BLUE CROSS/BLUE SHIELD | Admitting: *Deleted

## 2021-02-03 ENCOUNTER — Ambulatory Visit (INDEPENDENT_AMBULATORY_CARE_PROVIDER_SITE_OTHER): Payer: BLUE CROSS/BLUE SHIELD

## 2021-02-03 VITALS — BP 131/77 | HR 99 | Wt 235.0 lb

## 2021-02-03 DIAGNOSIS — O99211 Obesity complicating pregnancy, first trimester: Secondary | ICD-10-CM

## 2021-02-03 NOTE — Patient Instructions (Signed)
Susan Hubbard  02/03/2021   Your procedure is scheduled on:  02/06/2021  Arrive at 44 at [employer] on [address] at [facility]  and [employer]. You are invited to use the FREE valet parking or use the Visitor's parking deck.  Pick up the phone at the desk and dial [phone].  Call this number if you have problems the morning of surgery: [phone]  Remember:   Do not eat food:(After Midnight) Desps de medianoche.  Do not drink clear liquids: (After Midnight) Desps de medianoche.  Take these medicines the morning of surgery with A SIP OF WATER:  none   Do not wear jewelry, make-up or nail polish.  Do not wear lotions, powders, or perfumes. Do not wear deodorant.  Do not shave 48 hours prior to surgery.  Do not bring valuables to the hospital.  [facility] is not   responsible for any belongings or valuables brought to the hospital.  Contacts, dentures or bridgework may not be worn into surgery.  Leave suitcase in the car. After surgery it may be brought to your room.  For patients admitted to the hospital, checkout time is 11:00 AM the day of              discharge.      Please read over the following fact sheets that you were given:     Preparing for Surgery

## 2021-02-03 NOTE — Progress Notes (Signed)

## 2021-02-04 ENCOUNTER — Other Ambulatory Visit (HOSPITAL_COMMUNITY): Payer: BLUE CROSS/BLUE SHIELD

## 2021-02-04 ENCOUNTER — Encounter (HOSPITAL_COMMUNITY)
Admission: RE | Admit: 2021-02-04 | Discharge: 2021-02-04 | Disposition: A | Discharge status: Home / Self Care | Payer: BLUE CROSS/BLUE SHIELD | Source: Ambulatory Visit | Attending: Obstetrics and Gynecology | Admitting: Obstetrics and Gynecology

## 2021-02-04 ENCOUNTER — Other Ambulatory Visit
Admission: RE | Admit: 2021-02-04 | Discharge: 2021-02-04 | Disposition: A | Discharge status: Home / Self Care | Payer: BLUE CROSS/BLUE SHIELD | Source: Ambulatory Visit | Attending: Obstetrics and Gynecology | Admitting: Obstetrics and Gynecology

## 2021-02-04 ENCOUNTER — Other Ambulatory Visit: Payer: Self-pay

## 2021-02-04 DIAGNOSIS — Z01812 Encounter for preprocedural laboratory examination: Secondary | ICD-10-CM | POA: Insufficient documentation

## 2021-02-04 DIAGNOSIS — Z20822 Contact with and (suspected) exposure to covid-19: Secondary | ICD-10-CM | POA: Diagnosis not present

## 2021-02-04 HISTORY — DX: Gestational diabetes mellitus in pregnancy, unspecified control: O24.419

## 2021-02-04 HISTORY — DX: Family history of other specified conditions: Z84.89

## 2021-02-04 LAB — CBC
HCT: 35.2 % — ABNORMAL LOW (ref 36.0–46.0)
Hemoglobin: 11.6 g/dL — ABNORMAL LOW (ref 12.0–15.0)
MCH: 27.9 pg (ref 26.0–34.0)
MCHC: 33 g/dL (ref 30.0–36.0)
MCV: 84.6 fL (ref 80.0–100.0)
Platelets: 331 10*3/uL (ref 150–400)
RBC: 4.16 MIL/uL (ref 3.87–5.11)
RDW: 13.2 % (ref 11.5–15.5)
WBC: 6.7 10*3/uL (ref 4.0–10.5)
nRBC: 0 % (ref 0.0–0.2)

## 2021-02-04 LAB — COMPREHENSIVE METABOLIC PANEL
ALT: 14 U/L (ref 0–44)
AST: 16 U/L (ref 15–41)
Albumin: 2.8 g/dL — ABNORMAL LOW (ref 3.5–5.0)
Alkaline Phosphatase: 125 U/L (ref 38–126)
Anion gap: 9 (ref 5–15)
BUN: 8 mg/dL (ref 6–20)
CO2: 21 mmol/L — ABNORMAL LOW (ref 22–32)
Calcium: 9.1 mg/dL (ref 8.9–10.3)
Chloride: 106 mmol/L (ref 98–111)
Creatinine, Ser: 0.71 mg/dL (ref 0.44–1.00)
GFR, Estimated: >60 mL/min (ref >60)
Glucose, Bld: 121 mg/dL — ABNORMAL HIGH (ref 70–99)
Potassium: 3.7 mmol/L (ref 3.5–5.1)
Sodium: 136 mmol/L (ref 135–145)
Total Bilirubin: 0.4 mg/dL (ref 0.3–1.2)
Total Protein: 6.1 g/dL — ABNORMAL LOW (ref 6.5–8.1)

## 2021-02-04 LAB — TYPE AND SCREEN
ABO/RH(D): O POS
Antibody Screen: NEGATIVE

## 2021-02-04 LAB — SARS CORONAVIRUS 2 (TAT 6-24 HRS): SARS Coronavirus 2: NEGATIVE

## 2021-02-05 ENCOUNTER — Encounter (HOSPITAL_COMMUNITY): Payer: Self-pay | Admitting: Obstetrics and Gynecology

## 2021-02-05 LAB — RPR: RPR Ser Ql: NONREACTIVE

## 2021-02-06 ENCOUNTER — Inpatient Hospital Stay (HOSPITAL_COMMUNITY): Payer: BLUE CROSS/BLUE SHIELD | Admitting: Anesthesiology

## 2021-02-06 ENCOUNTER — Inpatient Hospital Stay (HOSPITAL_COMMUNITY)
Admission: RE | Admit: 2021-02-06 | Discharge: 2021-02-08 | DRG: 788 | Disposition: A | Discharge status: Home / Self Care | Payer: BLUE CROSS/BLUE SHIELD | Attending: Obstetrics and Gynecology | Admitting: Obstetrics and Gynecology

## 2021-02-06 ENCOUNTER — Encounter (HOSPITAL_COMMUNITY): Payer: Self-pay | Admitting: Obstetrics and Gynecology

## 2021-02-06 ENCOUNTER — Encounter (HOSPITAL_COMMUNITY)
Admission: RE | Disposition: A | Discharge status: Home / Self Care | Payer: Self-pay | Source: Home / Self Care | Attending: Obstetrics and Gynecology

## 2021-02-06 ENCOUNTER — Other Ambulatory Visit: Payer: Self-pay

## 2021-02-06 DIAGNOSIS — Z3A39 39 weeks gestation of pregnancy: Secondary | ICD-10-CM

## 2021-02-06 DIAGNOSIS — O99211 Obesity complicating pregnancy, first trimester: Secondary | ICD-10-CM | POA: Diagnosis not present

## 2021-02-06 DIAGNOSIS — Z98891 History of uterine scar from previous surgery: Secondary | ICD-10-CM

## 2021-02-06 DIAGNOSIS — O34219 Maternal care for unspecified type scar from previous cesarean delivery: Secondary | ICD-10-CM | POA: Diagnosis present

## 2021-02-06 DIAGNOSIS — Z9889 Other specified postprocedural states: Secondary | ICD-10-CM

## 2021-02-06 DIAGNOSIS — O0993 Supervision of high risk pregnancy, unspecified, third trimester: Secondary | ICD-10-CM

## 2021-02-06 DIAGNOSIS — Z8632 Personal history of gestational diabetes: Secondary | ICD-10-CM | POA: Diagnosis present

## 2021-02-06 DIAGNOSIS — Z87891 Personal history of nicotine dependence: Secondary | ICD-10-CM

## 2021-02-06 DIAGNOSIS — O34211 Maternal care for low transverse scar from previous cesarean delivery: Principal | ICD-10-CM | POA: Diagnosis present

## 2021-02-06 DIAGNOSIS — O24420 Gestational diabetes mellitus in childbirth, diet controlled: Secondary | ICD-10-CM | POA: Diagnosis present

## 2021-02-06 DIAGNOSIS — O24419 Gestational diabetes mellitus in pregnancy, unspecified control: Secondary | ICD-10-CM | POA: Diagnosis present

## 2021-02-06 DIAGNOSIS — O4690 Antepartum hemorrhage, unspecified, unspecified trimester: Secondary | ICD-10-CM

## 2021-02-06 DIAGNOSIS — O99214 Obesity complicating childbirth: Secondary | ICD-10-CM | POA: Diagnosis present

## 2021-02-06 DIAGNOSIS — O24410 Gestational diabetes mellitus in pregnancy, diet controlled: Secondary | ICD-10-CM | POA: Diagnosis not present

## 2021-02-06 LAB — GLUCOSE, CAPILLARY
Glucose-Capillary: 73 mg/dL (ref 70–99)
Glucose-Capillary: 84 mg/dL (ref 70–99)

## 2021-02-06 SURGERY — Surgical Case
Anesthesia: Spinal

## 2021-02-06 MED ORDER — MORPHINE SULFATE (PF) 0.5 MG/ML IJ SOLN
INTRAMUSCULAR | Status: AC
  Filled 2021-02-06: qty 10

## 2021-02-06 MED ORDER — MEPERIDINE HCL 25 MG/ML IJ SOLN: 6.2500 mg | INTRAMUSCULAR | Status: DC | PRN

## 2021-02-06 MED ORDER — PRENATAL MULTIVITAMIN CH
1.0000 | ORAL_TABLET | Freq: Every day | ORAL | Status: DC
  Administered 2021-02-06 – 2021-02-07 (×2): 1 via ORAL
  Filled 2021-02-06 (×2): qty 1

## 2021-02-06 MED ORDER — NALBUPHINE HCL 10 MG/ML IJ SOLN
5.0000 mg | Freq: Once | INTRAMUSCULAR | Status: AC | PRN
  Administered 2021-02-06: 5 mg via INTRAVENOUS

## 2021-02-06 MED ORDER — NALBUPHINE HCL 10 MG/ML IJ SOLN
INTRAMUSCULAR | Status: AC
  Filled 2021-02-06: qty 1

## 2021-02-06 MED ORDER — OXYCODONE-ACETAMINOPHEN 5-325 MG PO TABS
2.0000 | ORAL_TABLET | ORAL | Status: DC | PRN
  Administered 2021-02-07 – 2021-02-08 (×4): 2 via ORAL
  Filled 2021-02-06 (×4): qty 2

## 2021-02-06 MED ORDER — POVIDONE-IODINE 10 % EX SWAB: 2.0000 "application " | Freq: Once | CUTANEOUS | Status: DC

## 2021-02-06 MED ORDER — NALBUPHINE HCL 10 MG/ML IJ SOLN: 5.0000 mg | INTRAMUSCULAR | Status: DC | PRN

## 2021-02-06 MED ORDER — SCOPOLAMINE 1 MG/3DAYS TD PT72
MEDICATED_PATCH | TRANSDERMAL | Status: AC
  Filled 2021-02-06: qty 1

## 2021-02-06 MED ORDER — ONDANSETRON HCL 4 MG/2ML IJ SOLN
INTRAMUSCULAR | Status: AC
  Filled 2021-02-06: qty 2

## 2021-02-06 MED ORDER — LACTATED RINGERS IV SOLN: INTRAVENOUS | Status: DC

## 2021-02-06 MED ORDER — KETOROLAC TROMETHAMINE 30 MG/ML IJ SOLN
INTRAMUSCULAR | Status: AC
  Filled 2021-02-06: qty 1

## 2021-02-06 MED ORDER — EPHEDRINE SULFATE 50 MG/ML IJ SOLN
INTRAMUSCULAR | Status: DC | PRN
  Administered 2021-02-06: 10 mg via INTRAVENOUS

## 2021-02-06 MED ORDER — ACETAMINOPHEN 500 MG PO TABS
ORAL_TABLET | ORAL | Status: AC
  Filled 2021-02-06: qty 2

## 2021-02-06 MED ORDER — DEXAMETHASONE SODIUM PHOSPHATE 4 MG/ML IJ SOLN
INTRAMUSCULAR | Status: AC
  Filled 2021-02-06: qty 1

## 2021-02-06 MED ORDER — COCONUT OIL OIL: 1.0000 "application " | TOPICAL_OIL | Status: DC | PRN

## 2021-02-06 MED ORDER — PHENYLEPHRINE 40 MCG/ML (10ML) SYRINGE FOR IV PUSH (FOR BLOOD PRESSURE SUPPORT)
PREFILLED_SYRINGE | INTRAVENOUS | Status: AC
  Filled 2021-02-06: qty 20

## 2021-02-06 MED ORDER — MORPHINE SULFATE (PF) 0.5 MG/ML IJ SOLN
INTRAMUSCULAR | Status: DC | PRN
  Administered 2021-02-06: .15 mg via INTRATHECAL

## 2021-02-06 MED ORDER — PHENYLEPHRINE HCL-NACL 20-0.9 MG/250ML-% IV SOLN
INTRAVENOUS | Status: DC | PRN
  Administered 2021-02-06: 60 ug/min via INTRAVENOUS

## 2021-02-06 MED ORDER — KETOROLAC TROMETHAMINE 30 MG/ML IJ SOLN
30.0000 mg | Freq: Four times a day (QID) | INTRAMUSCULAR | Status: AC
  Administered 2021-02-06 – 2021-02-07 (×4): 30 mg via INTRAVENOUS
  Filled 2021-02-06 (×4): qty 1

## 2021-02-06 MED ORDER — SOD CITRATE-CITRIC ACID 500-334 MG/5ML PO SOLN
ORAL | Status: AC
  Filled 2021-02-06: qty 30

## 2021-02-06 MED ORDER — DIBUCAINE (PERIANAL) 1 % EX OINT: 1.0000 "application " | TOPICAL_OINTMENT | CUTANEOUS | Status: DC | PRN

## 2021-02-06 MED ORDER — DIPHENHYDRAMINE HCL 25 MG PO CAPS: 25.0000 mg | ORAL_CAPSULE | Freq: Four times a day (QID) | ORAL | Status: DC | PRN

## 2021-02-06 MED ORDER — TETANUS-DIPHTH-ACELL PERTUSSIS 5-2.5-18.5 LF-MCG/0.5 IM SUSY
0.5000 mL | PREFILLED_SYRINGE | Freq: Once | INTRAMUSCULAR | Status: DC
  Filled 2021-02-06: qty 0.5

## 2021-02-06 MED ORDER — SCOPOLAMINE 1 MG/3DAYS TD PT72
1.0000 | MEDICATED_PATCH | Freq: Once | TRANSDERMAL | Status: DC
  Administered 2021-02-06: 1.5 mg via TRANSDERMAL

## 2021-02-06 MED ORDER — WITCH HAZEL-GLYCERIN EX PADS: 1.0000 "application " | MEDICATED_PAD | CUTANEOUS | Status: DC | PRN

## 2021-02-06 MED ORDER — NALBUPHINE HCL 10 MG/ML IJ SOLN
5.0000 mg | Freq: Once | INTRAMUSCULAR | Status: AC | PRN
Start: 2021-02-06 — End: 2021-02-06

## 2021-02-06 MED ORDER — SOD CITRATE-CITRIC ACID 500-334 MG/5ML PO SOLN: 30.0000 mL | ORAL | Status: DC

## 2021-02-06 MED ORDER — KETOROLAC TROMETHAMINE 30 MG/ML IJ SOLN
INTRAMUSCULAR | Status: DC | PRN
  Administered 2021-02-06: 15 mg via INTRAVENOUS

## 2021-02-06 MED ORDER — CEFAZOLIN SODIUM-DEXTROSE 2-4 GM/100ML-% IV SOLN: 2.0000 g | INTRAVENOUS | Status: DC

## 2021-02-06 MED ORDER — KETOROLAC TROMETHAMINE 15 MG/ML IJ SOLN
15.0000 mg | INTRAMUSCULAR | Status: AC
  Administered 2021-02-06: 15 mg via INTRAVENOUS
  Filled 2021-02-06 (×2): qty 1

## 2021-02-06 MED ORDER — ACETAMINOPHEN 500 MG PO TABS
1000.0000 mg | ORAL_TABLET | Freq: Four times a day (QID) | ORAL | Status: AC
Start: 2021-02-06 — End: 2021-02-07
  Administered 2021-02-06 – 2021-02-07 (×3): 1000 mg via ORAL
  Filled 2021-02-06 (×3): qty 2

## 2021-02-06 MED ORDER — OXYCODONE-ACETAMINOPHEN 5-325 MG PO TABS
1.0000 | ORAL_TABLET | ORAL | Status: DC | PRN
Start: 2021-02-06 — End: 2021-02-08

## 2021-02-06 MED ORDER — FENTANYL CITRATE (PF) 100 MCG/2ML IJ SOLN
INTRAMUSCULAR | Status: AC
  Filled 2021-02-06: qty 2

## 2021-02-06 MED ORDER — SODIUM CHLORIDE 0.9% FLUSH: 3.0000 mL | INTRAVENOUS | Status: DC | PRN

## 2021-02-06 MED ORDER — NALOXONE HCL 4 MG/10ML IJ SOLN
1.0000 ug/kg/h | INTRAVENOUS | Status: DC | PRN
  Filled 2021-02-06: qty 5

## 2021-02-06 MED ORDER — ONDANSETRON HCL 4 MG/2ML IJ SOLN: 4.0000 mg | Freq: Three times a day (TID) | INTRAMUSCULAR | Status: DC | PRN

## 2021-02-06 MED ORDER — DIPHENHYDRAMINE HCL 50 MG/ML IJ SOLN
INTRAMUSCULAR | Status: DC | PRN
  Administered 2021-02-06 (×2): 25 mg via INTRAVENOUS

## 2021-02-06 MED ORDER — DIPHENHYDRAMINE HCL 25 MG PO CAPS
25.0000 mg | ORAL_CAPSULE | ORAL | Status: DC | PRN
  Administered 2021-02-07: 25 mg via ORAL
  Filled 2021-02-06: qty 1

## 2021-02-06 MED ORDER — DIPHENHYDRAMINE HCL 50 MG/ML IJ SOLN
INTRAMUSCULAR | Status: AC
  Filled 2021-02-06: qty 1

## 2021-02-06 MED ORDER — ACETAMINOPHEN 500 MG PO TABS
1000.0000 mg | ORAL_TABLET | ORAL | Status: AC
  Administered 2021-02-06: 1000 mg via ORAL

## 2021-02-06 MED ORDER — PHENYLEPHRINE HCL (PRESSORS) 10 MG/ML IV SOLN
INTRAVENOUS | Status: DC | PRN
  Administered 2021-02-06 (×5): 80 ug via INTRAVENOUS

## 2021-02-06 MED ORDER — BUPIVACAINE IN DEXTROSE 0.75-8.25 % IT SOLN
INTRATHECAL | Status: DC | PRN
  Administered 2021-02-06: 1.6 mL via INTRATHECAL

## 2021-02-06 MED ORDER — OXYTOCIN-SODIUM CHLORIDE 30-0.9 UT/500ML-% IV SOLN
2.5000 [IU]/h | INTRAVENOUS | Status: AC
  Filled 2021-02-06: qty 500

## 2021-02-06 MED ORDER — FENTANYL CITRATE (PF) 100 MCG/2ML IJ SOLN
INTRAMUSCULAR | Status: DC | PRN
  Administered 2021-02-06: 15 ug via INTRATHECAL

## 2021-02-06 MED ORDER — SOD CITRATE-CITRIC ACID 500-334 MG/5ML PO SOLN
30.0000 mL | Freq: Once | ORAL | Status: AC
  Administered 2021-02-06: 30 mL via ORAL

## 2021-02-06 MED ORDER — CEFAZOLIN SODIUM-DEXTROSE 2-4 GM/100ML-% IV SOLN
INTRAVENOUS | Status: AC
  Filled 2021-02-06: qty 100

## 2021-02-06 MED ORDER — OXYTOCIN-SODIUM CHLORIDE 30-0.9 UT/500ML-% IV SOLN
INTRAVENOUS | Status: DC | PRN
  Administered 2021-02-06: 300 mL via INTRAVENOUS

## 2021-02-06 MED ORDER — SIMETHICONE 80 MG PO CHEW
80.0000 mg | CHEWABLE_TABLET | ORAL | Status: DC | PRN
  Administered 2021-02-08 (×2): 80 mg via ORAL
  Filled 2021-02-06: qty 1

## 2021-02-06 MED ORDER — OXYCODONE HCL 5 MG PO TABS: 5.0000 mg | ORAL_TABLET | Freq: Once | ORAL | Status: DC | PRN

## 2021-02-06 MED ORDER — OXYCODONE HCL 5 MG/5ML PO SOLN: 5.0000 mg | Freq: Once | ORAL | Status: DC | PRN

## 2021-02-06 MED ORDER — PROMETHAZINE HCL 25 MG/ML IJ SOLN: 6.2500 mg | INTRAMUSCULAR | Status: DC | PRN

## 2021-02-06 MED ORDER — KETOROLAC TROMETHAMINE 30 MG/ML IJ SOLN: 30.0000 mg | Freq: Four times a day (QID) | INTRAMUSCULAR | Status: DC | PRN

## 2021-02-06 MED ORDER — FENTANYL CITRATE (PF) 100 MCG/2ML IJ SOLN: 25.0000 ug | INTRAMUSCULAR | Status: DC | PRN

## 2021-02-06 MED ORDER — SIMETHICONE 80 MG PO CHEW
80.0000 mg | CHEWABLE_TABLET | Freq: Three times a day (TID) | ORAL | Status: DC
  Administered 2021-02-06 – 2021-02-08 (×5): 80 mg via ORAL
  Filled 2021-02-06 (×5): qty 1

## 2021-02-06 MED ORDER — MENTHOL 3 MG MT LOZG: 1.0000 | LOZENGE | OROMUCOSAL | Status: DC | PRN

## 2021-02-06 MED ORDER — HYDROMORPHONE HCL 1 MG/ML IJ SOLN
1.0000 mg | INTRAMUSCULAR | Status: DC | PRN
Start: 2021-02-06 — End: 2021-02-08

## 2021-02-06 MED ORDER — ENOXAPARIN SODIUM 60 MG/0.6ML IJ SOSY
50.0000 mg | PREFILLED_SYRINGE | INTRAMUSCULAR | Status: DC
  Administered 2021-02-07: 50 mg via SUBCUTANEOUS
  Filled 2021-02-06 (×2): qty 0.6

## 2021-02-06 MED ORDER — MEPERIDINE HCL 25 MG/ML IJ SOLN
INTRAMUSCULAR | Status: DC | PRN
  Administered 2021-02-06 (×2): 12.5 mg via INTRAVENOUS

## 2021-02-06 MED ORDER — ZOLPIDEM TARTRATE 5 MG PO TABS: 5.0000 mg | ORAL_TABLET | Freq: Every evening | ORAL | Status: DC | PRN

## 2021-02-06 MED ORDER — ONDANSETRON HCL 4 MG/2ML IJ SOLN
INTRAMUSCULAR | Status: DC | PRN
  Administered 2021-02-06: 4 mg via INTRAVENOUS

## 2021-02-06 MED ORDER — NALOXONE HCL 0.4 MG/ML IJ SOLN: 0.4000 mg | INTRAMUSCULAR | Status: DC | PRN

## 2021-02-06 MED ORDER — DIPHENHYDRAMINE HCL 50 MG/ML IJ SOLN: 12.5000 mg | INTRAMUSCULAR | Status: DC | PRN

## 2021-02-06 MED ORDER — SENNOSIDES-DOCUSATE SODIUM 8.6-50 MG PO TABS
2.0000 | ORAL_TABLET | Freq: Every day | ORAL | Status: DC
  Administered 2021-02-07 – 2021-02-08 (×2): 2 via ORAL
  Filled 2021-02-06 (×2): qty 2

## 2021-02-06 MED ORDER — IBUPROFEN 800 MG PO TABS
800.0000 mg | ORAL_TABLET | Freq: Three times a day (TID) | ORAL | Status: DC
  Administered 2021-02-07 – 2021-02-08 (×3): 800 mg via ORAL
  Filled 2021-02-06 (×3): qty 1

## 2021-02-06 MED ORDER — LACTATED RINGERS IV SOLN: INTRAVENOUS | Status: DC | PRN

## 2021-02-06 MED ORDER — MEPERIDINE HCL 25 MG/ML IJ SOLN
INTRAMUSCULAR | Status: AC
  Filled 2021-02-06: qty 1

## 2021-02-06 SURGICAL SUPPLY — 42 items
BENZOIN TINCTURE PRP APPL 2/3 (GAUZE/BANDAGES/DRESSINGS) ×2 IMPLANT
CHLORAPREP W/TINT 26ML (MISCELLANEOUS) ×2 IMPLANT
CLAMP CORD UMBIL (MISCELLANEOUS) IMPLANT
CLOSURE STERI STRIP 1/2 X4 (GAUZE/BANDAGES/DRESSINGS) ×2 IMPLANT
CLOTH BEACON ORANGE TIMEOUT ST (SAFETY) ×2 IMPLANT
DRAPE C SECTION CLR SCREEN (DRAPES) IMPLANT
DRSG OPSITE POSTOP 4X10 (GAUZE/BANDAGES/DRESSINGS) ×2 IMPLANT
ELECT REM PT RETURN 9FT ADLT (ELECTROSURGICAL) ×2
ELECTRODE REM PT RTRN 9FT ADLT (ELECTROSURGICAL) ×1 IMPLANT
EXTRACTOR VACUUM M CUP 4 TUBE (SUCTIONS) IMPLANT
GLOVE BIO SURGEON STRL SZ7.5 (GLOVE) ×2 IMPLANT
GLOVE BIOGEL PI IND STRL 7.0 (GLOVE) ×1 IMPLANT
GLOVE BIOGEL PI INDICATOR 7.0 (GLOVE) ×1
GOWN STRL REUS W/TWL 2XL LVL3 (GOWN DISPOSABLE) ×2 IMPLANT
GOWN STRL REUS W/TWL LRG LVL3 (GOWN DISPOSABLE) ×4 IMPLANT
KIT ABG SYR 3ML LUER SLIP (SYRINGE) IMPLANT
NEEDLE HYPO 22GX1.5 SAFETY (NEEDLE) ×2 IMPLANT
NEEDLE HYPO 25X5/8 SAFETYGLIDE (NEEDLE) IMPLANT
NS IRRIG 1000ML POUR BTL (IV SOLUTION) ×2 IMPLANT
PACK C SECTION WH (CUSTOM PROCEDURE TRAY) ×2 IMPLANT
PAD OB MATERNITY 4.3X12.25 (PERSONAL CARE ITEMS) ×2 IMPLANT
PENCIL SMOKE EVAC W/HOLSTER (ELECTROSURGICAL) ×2 IMPLANT
RTRCTR C-SECT PINK 25CM LRG (MISCELLANEOUS) ×2 IMPLANT
SPONGE T-LAP 18X18 ~~LOC~~+RFID (SPONGE) ×4 IMPLANT
STRIP CLOSURE SKIN 1/2X4 (GAUZE/BANDAGES/DRESSINGS) ×2 IMPLANT
SUT CHROMIC 1 CTX 36 (SUTURE) ×4 IMPLANT
SUT PLAIN 0 NONE (SUTURE) IMPLANT
SUT VIC AB 1 CT1 36 (SUTURE) ×2 IMPLANT
SUT VIC AB 1 CTX 36 (SUTURE) ×1
SUT VIC AB 1 CTX36XBRD ANBCTRL (SUTURE) ×1 IMPLANT
SUT VIC AB 2-0 CT1 (SUTURE) ×2 IMPLANT
SUT VIC AB 2-0 CT1 27 (SUTURE) ×1
SUT VIC AB 2-0 CT1 TAPERPNT 27 (SUTURE) ×1 IMPLANT
SUT VIC AB 3-0 CT1 27 (SUTURE) ×1
SUT VIC AB 3-0 CT1 TAPERPNT 27 (SUTURE) ×1 IMPLANT
SUT VIC AB 3-0 SH 27 (SUTURE)
SUT VIC AB 3-0 SH 27X BRD (SUTURE) IMPLANT
SUT VIC AB 4-0 KS 27 (SUTURE) ×2 IMPLANT
SYR BULB IRRIGATION 50ML (SYRINGE) IMPLANT
TOWEL OR 17X24 6PK STRL BLUE (TOWEL DISPOSABLE) ×2 IMPLANT
TRAY FOLEY W/BAG SLVR 14FR LF (SET/KITS/TRAYS/PACK) ×2 IMPLANT
WATER STERILE IRR 1000ML POUR (IV SOLUTION) ×2 IMPLANT

## 2021-02-06 NOTE — Op Note (Signed)
Susan Hubbard PROCEDURE DATE: 02/06/2021  PREOPERATIVE DIAGNOSES: Intrauterine pregnancy at 28w1dweeks gestation; patient declines vag del attempt  POSTOPERATIVE DIAGNOSES: The same  PROCEDURE: Repeat Low Transverse Cesarean Section  SURGEON:  Dr. MArlina Robes ASSISTANT:  SDarrelyn Hillock ANESTHESIOLOGY TEAM: Anesthesiologist: BMerlinda Frederick MD CRNA: WGarner Nash CRNA  INDICATIONS: Susan ELLENWOODis a 30y.o. [phone] [id number] for cesarean section secondary to the indications listed under preoperative diagnoses; please see preoperative note for further details.  The risks of surgery were discussed with the patient including but were not limited to: bleeding which may require transfusion or reoperation; infection which may require antibiotics; injury to bowel, bladder, ureters or other surrounding organs; injury to the fetus; need for additional procedures including hysterectomy in the event of a life-threatening hemorrhage; formation of adhesions; placental abnormalities wth subsequent pregnancies; incisional problems; thromboembolic phenomenon and other postoperative/anesthesia complications.  The patient concurred with the proposed plan, giving informed written consent for the procedure.    FINDINGS:  Viable female infant in cephalic presentation.  Apgars as recorded. Wt as recorded. Clear amniotic fluid.  Intact placenta, three vessel cord.  Normal uterus, fallopian tubes and ovaries bilaterally.  ANESTHESIA: Spinal INTRAVENOUS FLUIDS: As recorded ESTIMATED BLOOD LOSS: 1300 ml URINE OUTPUT:  As recorded  SPECIMENS: Placenta sent to L&D COMPLICATIONS: None immediate  PROCEDURE IN DETAIL:  The patient preoperatively received intravenous antibiotics and had sequential compression devices applied to her lower extremities.  She was then taken to the operating room where spinal anesthesia was administered and was found to be adequate. She was then placed in a dorsal supine  position with a leftward tilt, and prepped and draped in a sterile manner.  A foley catheter was placed into her bladder and attached to constant gravity.  After an adequate timeout was performed, a Pfannenstiel skin incision was made with scalpel on her preexisting scar and carried through to the underlying layer of fascia. The fascia was incised in the midline, and this incision was extended bilaterally using the Mayo scissors.  Kocher clamps were applied to the superior aspect of the fascial incision and the underlying rectus muscles were dissected off bluntly and sharply.  A similar process was carried out on the inferior aspect of the fascial incision. The rectus muscles were separated in the midline and the peritoneum was entered bluntly. The Alexis self-retaining retractor was introduced into the abdominal cavity.  Attention was turned to the lower uterine segment where a low transverse hysterotomy was made with a scalpel and extended bilaterally bluntly.  The infant was successfully delivered, the cord was clamped and cut after one minute, and the infant was handed over to the awaiting neonatology team. Uterine massage was then administered, and the placenta delivered intact with a three-vessel cord. The uterus was then cleared of clots and debris.  The hysterotomy was closed with 0 Chromic in a running locked fashion, and an imbricating layer was also placed with 0 Chromic.  The pelvis was cleared of all clot and debris. Hemostasis was confirmed on all surfaces.  The retractor was removed.  The peritoneum  and rectus muscles were closed with a 2/0 Vicryl running stitch. The fascia was then closed using 0 Vicryl in a running fashion.  The subcutaneous layer was irrigated, reapproximated with 2/0 Vicryl  interrupted stitches, and the skin was closed with a 4-0 Vicryl subcuticular stitch. The patient tolerated the procedure well. Sponge, instrument and needle counts were correct x 3.  She was taken to the  recovery room in stable condition.    Arlina Robes, MD, [place] for [facility], [place]

## 2021-02-06 NOTE — Progress Notes (Signed)
Rn called doctor per lab request to see if doctor wanted labs to be drawn that were scheduled for 1230 today that were not done. Dr. Elonda Husky states to have the labs drawn in the morning 02/07/21 at 500 am and to hold the Lovenox injection until 10:00.

## 2021-02-06 NOTE — Anesthesia Preprocedure Evaluation (Addendum)
Anesthesia Evaluation  Patient identified by MRN, date of birth, ID band Patient awake    Reviewed: Allergy & Precautions, NPO status , Patient's Chart, lab work & pertinent test results  Airway Mallampati: III  TM Distance: >3 FB Neck ROM: Full    Dental no notable dental hx.    Pulmonary neg pulmonary ROS, former smoker,    Pulmonary exam normal breath sounds clear to auscultation       Cardiovascular negative cardio ROS Normal cardiovascular exam Rhythm:Regular Rate:Normal     Neuro/Psych negative neurological ROS  negative psych ROS   GI/Hepatic negative GI ROS, Neg liver ROS,   Endo/Other  negative endocrine ROSdiabetes  Renal/GU Renal InsufficiencyRenal disease (kidney stones)  negative genitourinary   Musculoskeletal negative musculoskeletal ROS (+)   Abdominal   Peds negative pediatric ROS (+)  Hematology  (+) anemia ,   Anesthesia Other Findings   Reproductive/Obstetrics negative OB ROS                            Anesthesia Physical Anesthesia Plan  ASA: 3  Anesthesia Plan: Spinal   Post-op Pain Management:    Induction:   PONV Risk Score and Plan: 2 and Treatment may vary due to age or medical condition and Ondansetron  Airway Management Planned: Natural Airway  Additional Equipment: None  Intra-op Plan:   Post-operative Plan:   Informed Consent: I have reviewed the patients History and Physical, chart, labs and discussed the procedure including the risks, benefits and alternatives for the proposed anesthesia with the patient or authorized representative who has indicated his/her understanding and acceptance.       Plan Discussed with: Anesthesiologist and CRNA  Anesthesia Plan Comments:         Anesthesia Quick Evaluation

## 2021-02-06 NOTE — H&P (Signed)
Susan Hubbard is a 30 y.o. female [phone] presenting for repeat c section at 28 1/7 weeks. Prenatal care unremarkable except for GDM, controlled. . OB History     Gravida  5   Para  1   Term  1   Preterm      AB  3   Living  1      SAB  1   IAB  2   Ectopic      Multiple      Live Births  1          Past Medical History:  Diagnosis Date   Anemia    Bacterial vaginosis 11/11/2015   History of   Chlamydia    Chronic kidney disease    h/o kidney stones   Family history of adverse reaction to anesthesia    mom unsure of what   Gestational diabetes    Hyperlipidemia    Irregular menses    Oligomenorrhea    PCOS (polycystic ovarian syndrome)    PCOS (polycystic ovarian syndrome)    Vaccine for human papilloma virus (HPV) types 6, 11, 16, and 18 administered    Vitamin D deficiency    Vitamin D deficiency    Past Surgical History:  Procedure Laterality Date   CESAREAN SECTION     HYSTEROSCOPY WITH D & C N/A 10/20/2015   Procedure: DILATATION AND CURETTAGE /HYSTEROSCOPY;  Surgeon: Malachy Mood, MD;  Location: [facility] ORS;  Service: Gynecology;  Laterality: N/A;   Family History: family history includes Brain cancer in her paternal grandmother; Cancer in her maternal grandfather and paternal grandfather; Cataracts in her paternal grandfather; Diabetes in her maternal grandmother, mother, and paternal grandfather; GER disease in her mother; Hyperlipidemia in her maternal grandmother, mother, and paternal grandfather; Hypertension in her father, mother, and paternal grandmother; Lung cancer in her paternal grandmother; Sleep apnea in her mother. Social History:  reports that she has quit smoking. Her smoking use included cigarettes. She has a 2.00 pack-year smoking history. She has never used smokeless tobacco. She reports that she does not drink alcohol and does not use drugs.     Maternal Diabetes: Yes:  Diabetes Type:  Diet controlled Genetic Screening:  Normal Maternal Ultrasounds/Referrals: Normal Fetal Ultrasounds or other Referrals:  None Maternal Substance Abuse:  No Significant Maternal Medications:  None Significant Maternal Lab Results:  None Other Comments:  None  Review of Systems  Constitutional: Negative.   Cardiovascular: Negative.   Gastrointestinal: Negative.   Genitourinary: Negative.   History   Blood pressure (!) 146/82, pulse (!) 101, temperature 98.5 F (36.9 C), temperature source Oral, resp. rate 16, height '5\' 3"'$  (1.6 m), weight 106.2 kg, last menstrual period 05/08/2020. Exam Physical Exam Constitutional:      Appearance: Normal appearance.  Cardiovascular:     Rate and Rhythm: Normal rate and regular rhythm.  Pulmonary:     Effort: Pulmonary effort is normal.     Breath sounds: Normal breath sounds.  Abdominal:     General: Bowel sounds are normal.     Palpations: Abdomen is soft.     Comments: FH = dates  Genitourinary:    Comments: deffered Neurological:     Mental Status: She is alert.    Prenatal labs: ABO, Rh: --/--/O POS (07/21 [id number]) Antibody: NEG (07/21 [id number]) Rubella: 2.85 (01/19 1148) RPR: NON REACTIVE (07/21 0906)  HBsAg: Negative (01/19 1148)  HIV: Non Reactive (05/11 0953)  GBS: Negative/-- (06/29 0434)   Assessment/Plan: IUP  39 1/7 weeks GDM Prior c section, desire for repeat   The risks of cesarean section discussed with the patient included but were not limited to: bleeding which may require transfusion or reoperation; infection which may require antibiotics; injury to bowel, bladder, ureters or other surrounding organs; injury to the fetus; need for additional procedures including hysterectomy in the event of a life-threatening hemorrhage; placental abnormalities wth subsequent pregnancies, incisional problems, thromboembolic phenomenon and other postoperative/anesthesia complications. The patient concurred with the proposed plan, giving informed written consent for the  procedure.   Anesthesia and OR aware. Preoperative prophylactic antibiotics and SCDs ordered on call to the OR.  To OR when ready.  Ricki Clack L. Rip Harbour, MD    Chancy Milroy 02/06/2021, 8:36 AM

## 2021-02-06 NOTE — Lactation Note (Signed)
This note was copied from a baby's chart. Lactation Consultation Note  Patient Name: Susan Hubbard [id number] Date: 02/06/2021 Reason for consult: Initial assessment;Term;Maternal endocrine disorder Age:31 hours  LC in to visit with P2 Mom of term infant.  RN asked for assistance as CBGs were 45 and 33.  Baby was fed glucose gel.  Baby with noisy respirations while FOB holding baby STS on his chest.  Offered assistance with latching baby to the breast.  Placed baby STS on Mom's chest.  Baby with some upper respiratory stridor.  Assisted baby to latch in cross cradle laid back hold.  Baby not interested in latching.  LC hand expressed 2 ml colostrum to a spoon and spoon fed this to baby.  Baby placed STS on Mom's chest.  Baby's respirations quiet.  Encouraged Mom to keep baby STS on her chest as much as possible.  Mom to offer breast with cues, and hand express to a spoon to supplement.   Will suggest double pumping if baby needs to be supplemented due to low CBG.  Lactation brochure provided.  Mom aware of IP and OP Lactation support available to her.   Maternal Data Has patient been taught Hand Expression?: Yes Does the patient have breastfeeding experience prior to this delivery?: Yes How long did the patient breastfeed?: 6 weeks with first  Feeding Mother's Current Feeding Choice: Breast Milk and Formula  LATCH Score Latch: Too sleepy or reluctant, no latch achieved, no sucking elicited.  Audible Swallowing: None  Type of Nipple: Everted at rest and after stimulation  Comfort (Breast/Nipple): Soft / non-tender  Hold (Positioning): Full assist, staff holds infant at breast  LATCH Score: 4   Interventions Interventions: Breast feeding basics reviewed;Assisted with latch;Skin to skin;Breast massage;Hand express;Adjust position;Support pillows;Expressed milk     Consult Status Consult Status: Follow-up Date: 02/07/21 Follow-up type: In-patient    [name] 02/06/2021, 5:16 PM

## 2021-02-06 NOTE — Discharge Summary (Signed)
Postpartum Discharge Summary    Patient Name: Susan Hubbard DOB: June 03, 1991 MRN: [id number]  Date of admission: 02/06/2021 Delivery date:02/06/2021  Delivering provider: Chancy Milroy  Date of discharge: 02/08/2021  Admitting diagnosis: Post-operative state [Z98.890] History of cesarean section [Z98.891] Intrauterine pregnancy: [redacted]w[redacted]d    Secondary diagnosis:  Active Problems:   Supervision of high risk pregnancy, antepartum, third trimester   Obesity affecting pregnancy in first trimester   H/O cesarean section complicating pregnancy   Gestational diabetes mellitus (GDM), antepartum   Post-operative state   History of cesarean section   OB Hemorrhage  Additional problems: NA    Discharge diagnosis: Term Pregnancy Delivered                                              Post partum procedures: NA Augmentation: N/A Complications: None  Hospital course: Sceduled C/S   30y.o. yo [id number] admitted to the hospital 02/06/2021 for scheduled cesarean section with the following indication:Elective Repeat.Delivery details are as follows:  Membrane Rupture Time/Date: 9:55 AM ,02/06/2021   Delivery Method:C-Section, Low Transverse  Details of operation can be found in separate operative note, notable for OB hemorrhage with QBL of 1300cc.  Patient had an uncomplicated postpartum course. Hgb was 7.5 on day after delivery, she was given IV venofer. She is ambulating, tolerating a regular diet, passing flatus, and urinating well. Patient is discharged home in stable condition on  02/08/21        Newborn Data: Birth date:02/06/2021  Birth time:9:55 AM  Gender:Female  Living status:Living  Apgars:9 ,9  [id number]     Magnesium Sulfate received: No BMZ received: No Rhophylac:No MMR:Yes T-DaP: prenataly Flu: N/A Transfusion:No  Physical exam  Vitals:   02/07/21 0430 02/07/21 1430 02/07/21 2050 02/08/21 0608  BP: 104/71 123/72 105/67 119/83  Pulse: 68 97 84 99  Resp: _0 Temp: 98.9 F (37.2 C) 98.3 F (36.8 C) 98.6 F (37 C) 98.9 F (37.2 C)  TempSrc: Oral Oral Oral Oral  SpO2: 100% 100% 100% 100%  Weight:      Height:       General: alert, cooperative, and no distress Lochia: appropriate Uterine Fundus: firm Incision: No significant erythema, Dressing is clean, dry, and intact DVT Evaluation: No evidence of DVT seen on physical exam. Labs: Lab Results  Component Value Date   WBC 10.2 02/07/2021   HGB 7.5 (L) 02/07/2021   HCT 22.6 (L) 02/07/2021   MCV 85.9 02/07/2021   PLT 251 02/07/2021   CMP Latest Ref Rng & Units 02/08/2021  Glucose 70 - 99 mg/dL 93  BUN 6 - 20 mg/dL -  Creatinine 0.44 - 1.00 mg/dL -  Sodium 135 - 145 mmol/L -  Potassium 3.5 - 5.1 mmol/L -  Chloride 98 - 111 mmol/L -  CO2 22 - 32 mmol/L -  Calcium 8.9 - 10.3 mg/dL -  Total Protein 6.5 - 8.1 g/dL -  Total Bilirubin 0.3 - 1.2 mg/dL -  Alkaline Phos 38 - 126 U/L -  AST 15 - 41 U/L -  ALT 0 - 44 U/L -   Edinburgh Score: Edinburgh Postnatal Depression Scale Screening Tool 02/07/2021  I have been able to laugh and see the funny side of things. 0  I have looked forward with enjoyment to things. 0  I  have blamed myself unnecessarily when things went wrong. 0  I have been anxious or worried for no good reason. 0  I have felt scared or panicky for no good reason. 0  Things have been getting on top of me. 0  I have been so unhappy that I have had difficulty sleeping. 0  I have felt sad or miserable. 0  I have been so unhappy that I have been crying. 0  The thought of harming myself has occurred to me. 0  Edinburgh Postnatal Depression Scale Total 0      After visit meds:  Allergies as of 02/08/2021       Reactions   Augmentin [amoxicillin-pot Clavulanate] Nausea And Vomiting   Has patient had a PCN reaction causing immediate rash, facial/tongue/throat swelling, SOB or lightheadedness with hypotension: no Has patient had a PCN reaction causing severe rash  involving mucus membranes or skin necrosis: unknown Has patient had a PCN reaction that required hospitalization no Has patient had a PCN reaction occurring within the last 10 years: yes If all of the above answers are "NO", then may proceed with Cephalosporin use.        Medication List     STOP taking these medications    Accu-Chek Guide test strip Generic drug: glucose blood   Accu-Chek Guide w/Device Kit   Accu-Chek Softclix Lancets lancets   promethazine 25 MG tablet Commonly known as: PHENERGAN       TAKE these medications    acetaminophen 325 MG tablet Commonly known as: Tylenol Take 2 tablets (650 mg total) by mouth every 4 (four) hours as needed.   ibuprofen 800 MG tablet Commonly known as: ADVIL Take 1 tablet (800 mg total) by mouth every 8 (eight) hours.   norethindrone 0.35 MG tablet Commonly known as: Ortho Micronor Take 1 tablet (0.35 mg total) by mouth daily.   oxyCODONE-acetaminophen 5-325 MG tablet Commonly known as: PERCOCET/ROXICET Take 1-2 tablets by mouth every 6 (six) hours as needed for severe pain.   polyethylene glycol powder 17 GM/SCOOP powder Commonly known as: GLYCOLAX/MIRALAX Take 17 g by mouth daily as needed.       ASK your doctor about these medications    multivitamin-prenatal 27-0.8 MG Tabs tablet Take 1 tablet by mouth daily at 12 noon.               Discharge Care Instructions  (From admission, onward)           Start     Ordered   02/08/21 0000  Discharge wound care:       Comments: Keep honeycomb dressing on for five days, remove by pulling outward on both edges to release the adhesive. Keep the dressing clean and dry until that time, do not allow it to get wet during showers. After the dressing is removed allow warm soapy water to run over the incision and pat dry, do not scrub.   02/08/21 0948             Discharge home in stable condition Infant Feeding: Breast Infant Disposition:home with  mother Discharge instruction: per After Visit Summary and Postpartum booklet. Activity: Advance as tolerated. Pelvic rest for 6 weeks.  Diet: routine diet Anticipated Birth Control: POPs Postpartum Appointment:4 weeks Additional Postpartum F/U: 2 hour GTT and Incision check 1 week Future Appointments:No future appointments. Follow up Visit:      02/08/2021 Clarnce Flock, MD

## 2021-02-06 NOTE — Transfer of Care (Signed)
Immediate Anesthesia Transfer of Care Note  Patient: Susan Hubbard  Procedure(s) Performed: CESAREAN SECTION  Patient Location: PACU  Anesthesia Type:Spinal  Level of Consciousness: awake and alert   Airway & Oxygen Therapy: Patient Spontanous Breathing  Post-op Assessment: Report given to RN  Post vital signs: Reviewed  Last Vitals:  Vitals Value Taken Time  BP 117/73 02/06/21 1045  Temp    Pulse 95 02/06/21 1050  Resp 22 02/06/21 1050  SpO2 100 % 02/06/21 1050  Vitals shown include unvalidated device data.  Last Pain:  Vitals:   02/06/21 0752  TempSrc: Oral         Complications: No notable events documented.

## 2021-02-06 NOTE — Anesthesia Postprocedure Evaluation (Signed)
Anesthesia Post Note  Patient: [name]  Procedure(s) Performed: [place]     Patient location during evaluation: Mother Baby Anesthesia Type: Spinal Level of consciousness: oriented and awake and alert Pain management: pain level controlled Vital Signs Assessment: post-procedure vital signs reviewed and stable Respiratory status: spontaneous breathing and respiratory function stable Cardiovascular status: blood pressure returned to baseline and stable Postop Assessment: no headache, no backache, no apparent nausea or vomiting and spinal receding Anesthetic complications: no   No notable events documented.  Last Vitals:  Vitals:   02/06/21 1220 02/06/21 1314  BP: 116/62 128/76  Pulse: 92 95  Resp: 18 18  Temp: 37.4 C 37.2 C  SpO2: 100% 100%    Last Pain:  Vitals:   02/06/21 1314  TempSrc: Oral  PainSc: 0-No pain   Pain Goal:                Epidural/Spinal Function Cutaneous sensation: Tingles (02/06/21 1314), Patient able to flex knees: Yes (02/06/21 1314), Patient able to lift hips off bed: No (02/06/21 1314), Back pain beyond tenderness at insertion site: No (02/06/21 1314), Progressively worsening motor and/or sensory loss: No (02/06/21 1314), Bowel and/or bladder incontinence post epidural: No (02/06/21 1314)  Susan Hubbard

## 2021-02-07 DIAGNOSIS — Z98891 History of uterine scar from previous surgery: Secondary | ICD-10-CM

## 2021-02-07 LAB — CBC
HCT: 22.6 % — ABNORMAL LOW (ref 36.0–46.0)
Hemoglobin: 7.5 g/dL — ABNORMAL LOW (ref 12.0–15.0)
MCH: 28.5 pg (ref 26.0–34.0)
MCHC: 33.2 g/dL (ref 30.0–36.0)
MCV: 85.9 fL (ref 80.0–100.0)
Platelets: 251 10*3/uL (ref 150–400)
RBC: 2.63 MIL/uL — ABNORMAL LOW (ref 3.87–5.11)
RDW: 13.1 % (ref 11.5–15.5)
WBC: 10.2 10*3/uL (ref 4.0–10.5)
nRBC: 0 % (ref 0.0–0.2)

## 2021-02-07 LAB — CREATININE, SERUM
Creatinine, Ser: 0.59 mg/dL (ref 0.44–1.00)
GFR, Estimated: >60 mL/min (ref >60)

## 2021-02-07 MED ORDER — SODIUM CHLORIDE 0.9 % IV SOLN
500.0000 mg | Freq: Once | INTRAVENOUS | Status: AC
  Administered 2021-02-07: 500 mg via INTRAVENOUS
  Filled 2021-02-07: qty 25

## 2021-02-07 MED ORDER — LACTATED RINGERS IV SOLN: INTRAVENOUS | Status: DC

## 2021-02-07 NOTE — Progress Notes (Signed)
POSTPARTUM PROGRESS NOTE  Post Partum Day 1  Subjective:  Susan Hubbard is a 30 y.o. [id number] s/p rLTCS at [redacted]w[redacted]d  No acute events overnight.  Pt denies problems with ambulating, voiding or po intake.  She denies nausea or vomiting.  Pain is well controlled.  She has had flatus. She has not had bowel movement.  Lochia Minimal.   Objective: Blood pressure 104/71, pulse 68, temperature 98.9 F (37.2 C), temperature source Oral, resp. rate 18, height '5\' 3"'$  (1.6 m), weight 106.2 kg, last menstrual period 05/08/2020, SpO2 100 %.  Patient Vitals for the past 24 hrs:  BP Temp Temp src Pulse Resp SpO2  02/07/21 0430 104/71 98.9 F (37.2 C) Oral 68 18 100 %  02/07/21 0030 120/60 98.7 F (37.1 C) -- 84 18 100 %  02/06/21 2000 122/69 (!) 97.4 F (36.3 C) Oral 90 18 100 %  02/06/21 1627 114/72 99.3 F (37.4 C) Oral 84 17 99 %  02/06/21 1515 114/72 99 F (37.2 C) Oral 88 18 100 %  02/06/21 1420 118/66 98 F (36.7 C) Oral 78 18 100 %  02/06/21 1314 128/76 98.9 F (37.2 C) Oral 95 18 100 %   Physical Exam:  General: alert, cooperative and no distress Chest: no respiratory distress Heart:regular rate, distal pulses intact Abdomen: soft, nontender,  Uterine Fundus: firm, appropriately tender DVT Evaluation: No calf swelling or tenderness Extremities: no edema Skin: warm, dry; incision clean/dry/intact.  Recent Labs    02/07/21 0514  HGB 7.5*  HCT 22.6*    Assessment/Plan: Susan Hubbard is a 30y.o. [id number]/p rLTCS at 373w2d PPD#1 - Doing well Contraception: unsure Hgb: Venofer '500mg'$  ordered Dispo: Plan for discharge tomorrow.   LOS: 1 day   Rhyan Radler, NiGerrie NordmannNP 02/07/2021, 12:51 PM

## 2021-02-07 NOTE — Lactation Note (Signed)
This note was copied from a baby's chart. Lactation Consultation Note  Patient Name: [name] [id number] Date: 02/07/2021 Reason for consult: Follow-up assessment;Maternal endocrine disorder PCOS, Diabetes Age:31 hours  Mother pumping with DEBP upon entering. Mother is concerned about her milk supply because she is not pumping volume.  Provided education regarding milk transitioning.  LC will return after photos.   Feeding Mother's Current Feeding Choice: Breast Milk and Formula Nipple Type: Slow - flow   Lactation Tools Discussed/Used Tools: Pump Breast pump type: Double-Electric Breast Pump Pump Education: Other (comment) Reason for Pumping: stimulation and supplementation Pumping frequency: q 3 hours  Interventions Interventions: DEBP;Education     Consult Status Consult Status: Follow-up Date: 02/07/21 Follow-up type: In-patient    Vivianne Master [facility] 02/07/2021, 12:16 PM

## 2021-02-07 NOTE — Lactation Note (Signed)
This note was copied from a baby's chart. Lactation Consultation Note  Patient Name: Susan Hubbard [id number] Date: 02/07/2021 Reason for consult: Follow-up assessment Age:31 hours  P2, Baby cueing.  Mother latched baby in cradle hold. LC provided pillows for support. Encouraged mother to offer breast before formula to help her milk supply and post pump. No volume pumped with last session. Feed on demand with cues.  Goal 8-12+ times per day after first 24 hrs.  Place baby STS if not cueing.    Feeding Mother's Current Feeding Choice: Breast Milk and Formula Nipple Type: Slow - flow  LATCH Score Latch: Grasps breast easily, tongue down, lips flanged, rhythmical sucking.  Audible Swallowing: A few with stimulation  Type of Nipple: Everted at rest and after stimulation  Comfort (Breast/Nipple): Soft / non-tender  Hold (Positioning): Assistance needed to correctly position infant at breast and maintain latch.  LATCH Score: 8   Lactation Tools Discussed/Used Tools: Pump Breast pump type: Double-Electric Breast Pump Pump Education: Other (comment) Reason for Pumping: stimulation and supplementation Pumping frequency: q 3 hours  Interventions Interventions: Breast feeding basics reviewed;Assisted with latch;Support pillows;Education;DEBP  Discharge Pump: DEBP  Consult Status Consult Status: Follow-up Date: 02/08/21 Follow-up type: In-patient    Vivianne Master [facility] 02/07/2021, 12:57 PM

## 2021-02-08 ENCOUNTER — Encounter (HOSPITAL_COMMUNITY): Payer: Self-pay | Admitting: *Deleted

## 2021-02-08 DIAGNOSIS — O4690 Antepartum hemorrhage, unspecified, unspecified trimester: Secondary | ICD-10-CM | POA: Diagnosis not present

## 2021-02-08 DIAGNOSIS — O24410 Gestational diabetes mellitus in pregnancy, diet controlled: Secondary | ICD-10-CM | POA: Diagnosis not present

## 2021-02-08 DIAGNOSIS — Z98891 History of uterine scar from previous surgery: Secondary | ICD-10-CM | POA: Diagnosis not present

## 2021-02-08 DIAGNOSIS — Z9889 Other specified postprocedural states: Secondary | ICD-10-CM | POA: Diagnosis not present

## 2021-02-08 DIAGNOSIS — O99211 Obesity complicating pregnancy, first trimester: Secondary | ICD-10-CM

## 2021-02-08 LAB — GLUCOSE, RANDOM: Glucose, Bld: 93 mg/dL (ref 70–99)

## 2021-02-08 MED ORDER — NORETHINDRONE 0.35 MG PO TABS: 1.0000 | ORAL_TABLET | Freq: Every day | ORAL | 11 refills | Status: DC

## 2021-02-08 MED ORDER — OXYCODONE-ACETAMINOPHEN 5-325 MG PO TABS: 1.0000 | ORAL_TABLET | Freq: Four times a day (QID) | ORAL | 0 refills | Status: DC | PRN

## 2021-02-08 MED ORDER — IBUPROFEN 800 MG PO TABS: 800.0000 mg | ORAL_TABLET | Freq: Three times a day (TID) | ORAL | 0 refills | Status: DC

## 2021-02-08 MED ORDER — ACETAMINOPHEN 325 MG PO TABS: 650.0000 mg | ORAL_TABLET | ORAL | 2 refills | Status: DC | PRN

## 2021-02-08 MED ORDER — POLYETHYLENE GLYCOL 3350 17 GM/SCOOP PO POWD: 17.0000 g | Freq: Every day | ORAL | 1 refills | Status: DC | PRN

## 2021-02-09 NOTE — Progress Notes (Signed)
Chart reviewed - agree with CMA/RN documentation.  ° °

## 2021-02-15 ENCOUNTER — Other Ambulatory Visit: Payer: Self-pay

## 2021-02-15 ENCOUNTER — Ambulatory Visit: Payer: BLUE CROSS/BLUE SHIELD | Admitting: *Deleted

## 2021-02-15 DIAGNOSIS — Z9889 Other specified postprocedural states: Secondary | ICD-10-CM

## 2021-02-15 NOTE — Progress Notes (Signed)
Subjective:     Susan Hubbard is a 30 y.o. female who presents to the clinic 10 days status post low uterine, transverse cesarean section. Pt reports incision is healing well.      Objective:    There were no vitals taken for this visit. General:  alert, well appearing, in no apparent distress  Incision:   healing well, no drainage, no erythema, no hernia, no seroma, no swelling, no dehiscence, incision well approximated     Assessment:    Doing well postoperatively.   Plan:   1. Continue any current medications. 2. Wound care discussed. 3. Follow up: 6wk postpartum.  4. Honeycomb dressing removed. Loose Steristips removed.  Penny Pia, RN

## 2021-02-18 ENCOUNTER — Telehealth (HOSPITAL_COMMUNITY): Payer: Self-pay

## 2021-02-18 NOTE — Telephone Encounter (Signed)
No answer. Left message to return nurse call.  Sharyn Lull [facility] 02/18/2021,1732

## 2021-03-07 ENCOUNTER — Other Ambulatory Visit: Payer: Self-pay

## 2021-03-07 ENCOUNTER — Inpatient Hospital Stay (HOSPITAL_COMMUNITY)
Admission: RE | Admit: 2021-03-07 | Discharge: 2021-03-07 | Disposition: A | Discharge status: Home / Self Care | Payer: BLUE CROSS/BLUE SHIELD | Attending: Obstetrics and Gynecology | Admitting: Obstetrics and Gynecology

## 2021-03-07 ENCOUNTER — Ambulatory Visit
Admission: EM | Admit: 2021-03-07 | Discharge: 2021-03-07 | Disposition: A | Discharge status: Home / Self Care | Payer: BLUE CROSS/BLUE SHIELD

## 2021-03-07 ENCOUNTER — Encounter (HOSPITAL_COMMUNITY): Payer: Self-pay | Admitting: Obstetrics and Gynecology

## 2021-03-07 DIAGNOSIS — Z880 Allergy status to penicillin: Secondary | ICD-10-CM | POA: Insufficient documentation

## 2021-03-07 DIAGNOSIS — O99893 Other specified diseases and conditions complicating puerperium: Secondary | ICD-10-CM | POA: Diagnosis not present

## 2021-03-07 DIAGNOSIS — R109 Unspecified abdominal pain: Secondary | ICD-10-CM | POA: Diagnosis present

## 2021-03-07 DIAGNOSIS — Z881 Allergy status to other antibiotic agents status: Secondary | ICD-10-CM | POA: Diagnosis not present

## 2021-03-07 DIAGNOSIS — L089 Local infection of the skin and subcutaneous tissue, unspecified: Secondary | ICD-10-CM | POA: Diagnosis not present

## 2021-03-07 DIAGNOSIS — O8601 Infection of obstetric surgical wound, superficial incisional site: Secondary | ICD-10-CM | POA: Insufficient documentation

## 2021-03-07 MED ORDER — IBUPROFEN 800 MG PO TABS: 800.0000 mg | ORAL_TABLET | Freq: Three times a day (TID) | ORAL | 0 refills | Status: DC | PRN

## 2021-03-07 MED ORDER — SULFAMETHOXAZOLE-TRIMETHOPRIM 800-160 MG PO TABS: 1.0000 | ORAL_TABLET | Freq: Two times a day (BID) | ORAL | 0 refills | Status: DC

## 2021-03-07 NOTE — MAU Note (Signed)
Susan Hubbard is a 30 y.o. here in MAU reporting: s/p RCS on 7/23. States pain in her incision had improved and now over the past week she has had increased pain and soreness, specifically on the right side. Today noticed some white drainage and when she wiped the drainage away she did notice an odor.  Onset of complaint: today  Pain score: 3/10  Vitals:   03/07/21 1121  BP: 116/85  Pulse: 74  Resp: 16  Temp: 98.7 F (37.1 C)  SpO2: 99%     Lab orders placed from triage: none

## 2021-03-07 NOTE — MAU Provider Note (Addendum)
History     CSN: [id number]  Arrival date and time: 03/07/21 1059   Event Date/Time   First Provider Initiated Contact with Patient 03/07/21 1140      Chief Complaint  Patient presents with   Abdominal Pain   30 y.o. G5P1 s/p CS about 1 month ago presenting with drainage from incision and pain. She first noticed pain on the right side of her incision about 6 days ago. Rates pain 3/10. Has not treated it. Today she saw some pink/white drainage come from the right side. Reports bad smell. Denies fever/chills. Feels well otherwise. Eating and drinking w/o problem.    OB History     Gravida  5   Para  1   Term  1   Preterm      AB  3   Living  1      SAB  1   IAB  2   Ectopic      Multiple      Live Births  1           Past Medical History:  Diagnosis Date   Anemia    Bacterial vaginosis 11/11/2015   History of   Chlamydia    Chronic kidney disease    h/o kidney stones   Family history of adverse reaction to anesthesia    mom unsure of what   Gestational diabetes    Hyperlipidemia    Irregular menses    Oligomenorrhea    PCOS (polycystic ovarian syndrome)    PCOS (polycystic ovarian syndrome)    Vaccine for human papilloma virus (HPV) types 6, 11, 16, and 18 administered    Vitamin D deficiency    Vitamin D deficiency     Past Surgical History:  Procedure Laterality Date   CESAREAN SECTION     CESAREAN SECTION N/A 02/06/2021   Procedure: CESAREAN SECTION;  Surgeon: Chancy Milroy, MD;  Location: [facility];  Service: Obstetrics;  Laterality: N/A;   HYSTEROSCOPY WITH D & C N/A 10/20/2015   Procedure: DILATATION AND CURETTAGE /HYSTEROSCOPY;  Surgeon: Malachy Mood, MD;  Location: [facility] ORS;  Service: Gynecology;  Laterality: N/A;    Family History  Problem Relation Age of Onset   Diabetes Mother        type II   Hyperlipidemia Mother    Hypertension Mother    GER disease Mother    Sleep apnea Mother    Hypertension Father     Hyperlipidemia Maternal Grandmother    Diabetes Maternal Grandmother        Type II   Cancer Maternal Grandfather        Lung cancer   Hypertension Paternal Grandmother    Brain cancer Paternal Grandmother    Lung cancer Paternal Grandmother    Cancer Paternal Grandfather    Diabetes Paternal Grandfather    Hyperlipidemia Paternal Grandfather    Cataracts Paternal Grandfather     Social History   Tobacco Use   Smoking status: Former    Packs/day: 0.25    Years: 8.00    Pack years: 2.00    Types: Cigarettes   Smokeless tobacco: Never   Tobacco comments:    quit 2019  Vaping Use   Vaping Use: Never used  Substance Use Topics   Alcohol use: No   Drug use: No    Allergies:  Allergies  Allergen Reactions   Augmentin [Amoxicillin-Pot Clavulanate] Nausea And Vomiting    Has patient had a PCN reaction causing  immediate rash, facial/tongue/throat swelling, SOB or lightheadedness with hypotension: no Has patient had a PCN reaction causing severe rash involving mucus membranes or skin necrosis: unknown Has patient had a PCN reaction that required hospitalization no Has patient had a PCN reaction occurring within the last 10 years: yes If all of the above answers are "NO", then may proceed with Cephalosporin use.     Medications Prior to Admission  Medication Sig Dispense Refill Last Dose   [DISCONTINUED] ibuprofen (ADVIL) 800 MG tablet Take 1 tablet (800 mg total) by mouth every 8 (eight) hours. 30 tablet 0 Past Month   acetaminophen (TYLENOL) 325 MG tablet Take 2 tablets (650 mg total) by mouth every 4 (four) hours as needed. 100 tablet 2 More than a month   norethindrone (ORTHO MICRONOR) 0.35 MG tablet Take 1 tablet (0.35 mg total) by mouth daily. 28 tablet 11 More than a month   oxyCODONE-acetaminophen (PERCOCET/ROXICET) 5-325 MG tablet Take 1-2 tablets by mouth every 6 (six) hours as needed for severe pain. 20 tablet 0    polyethylene glycol powder (GLYCOLAX/MIRALAX) 17  GM/SCOOP powder Take 17 g by mouth daily as needed. 510 g 1    Prenatal Vit-Fe Fumarate-FA (MULTIVITAMIN-PRENATAL) 27-0.8 MG TABS tablet Take 1 tablet by mouth daily at 12 noon. (Patient taking differently: Take 1 tablet by mouth in the morning.) 30 tablet 0     Review of Systems  Constitutional:  Negative for chills and fever.  Gastrointestinal:  Positive for abdominal pain.  Skin:  Positive for wound.  Physical Exam   Blood pressure 116/85, pulse 74, temperature 98.7 F (37.1 C), temperature source Oral, resp. rate 16, weight 95.7 kg, SpO2 99 %, currently breastfeeding.  Physical Exam Vitals and nursing note reviewed.  Constitutional:      General: She is not in acute distress.    Appearance: Normal appearance.  HENT:     Head: Normocephalic and atraumatic.  Cardiovascular:     Rate and Rhythm: Normal rate.  Pulmonary:     Effort: Pulmonary effort is normal. No respiratory distress.  Abdominal:     Palpations: Abdomen is soft.     Tenderness: There is no abdominal tenderness.  Skin:    General: Skin is warm and dry.     Comments: Low transverse incision to lower abdomen well healed and intact except tiny (approx 99m) skin opening at right lateral edge, small amt yellow malodorous purulent drainage present and expressible with compression. No erythema. Moderate tenderness.   Neurological:     General: No focal deficit present.     Mental Status: She is alert and oriented to person, place, and time.  Psychiatric:        Mood and Affect: Mood normal.        Behavior: Behavior normal.   No results found for this or any previous visit (from the past 24 hour(s)).  MAU Course  Procedures  MDM Suspect mild subcutaneous infection. Plan for abx. Discussed with Dr. ERip Harbour agrees. Stable for discharge home.   Assessment and Plan   1. Wound infection    [facility] Return precautions Rx Bactrim Rx Ibuprofen  Allergies as of  03/07/2021       Reactions   Augmentin [amoxicillin-pot Clavulanate] Nausea And Vomiting   Has patient had a PCN reaction causing immediate rash, facial/tongue/throat swelling, SOB or lightheadedness with hypotension: no Has patient had a PCN reaction causing severe rash involving mucus membranes or  skin necrosis: unknown Has patient had a PCN reaction that required hospitalization no Has patient had a PCN reaction occurring within the last 10 years: yes If all of the above answers are "NO", then may proceed with Cephalosporin use.        Medication List     STOP taking these medications    oxyCODONE-acetaminophen 5-325 MG tablet Commonly known as: PERCOCET/ROXICET   polyethylene glycol powder 17 GM/SCOOP powder Commonly known as: GLYCOLAX/MIRALAX       TAKE these medications    acetaminophen 325 MG tablet Commonly known as: Tylenol Take 2 tablets (650 mg total) by mouth every 4 (four) hours as needed.   ibuprofen 800 MG tablet Commonly known as: ADVIL Take 1 tablet (800 mg total) by mouth every 8 (eight) hours as needed. What changed:  when to take this reasons to take this   multivitamin-prenatal 27-0.8 MG Tabs tablet Take 1 tablet by mouth daily at 12 noon. What changed: when to take this   norethindrone 0.35 MG tablet Commonly known as: Ortho Micronor Take 1 tablet (0.35 mg total) by mouth daily.   sulfamethoxazole-trimethoprim 800-160 MG tablet Commonly known as: BACTRIM DS Take 1 tablet by mouth 2 (two) times daily.       Julianne Handler, CNM 03/07/2021, 11:55 AM

## 2021-03-15 ENCOUNTER — Encounter: Payer: Self-pay | Admitting: Obstetrics and Gynecology

## 2021-03-15 ENCOUNTER — Ambulatory Visit (INDEPENDENT_AMBULATORY_CARE_PROVIDER_SITE_OTHER): Payer: BLUE CROSS/BLUE SHIELD | Admitting: Obstetrics and Gynecology

## 2021-03-15 ENCOUNTER — Other Ambulatory Visit: Payer: Self-pay

## 2021-03-15 VITALS — BP 111/76 | HR 80 | Wt 212.0 lb

## 2021-03-15 DIAGNOSIS — O8601 Infection of obstetric surgical wound, superficial incisional site: Secondary | ICD-10-CM

## 2021-03-15 DIAGNOSIS — O8600 Infection of obstetric surgical wound, unspecified: Secondary | ICD-10-CM

## 2021-03-15 NOTE — Progress Notes (Signed)
PP presents for wound/incision check  Seen at hospital on 03/07/21 pt had mild subcutaneous infection. Treatment Rx given.  CC: pain and itching pt states she believes area is leaking just finished Rx today.   Pt needs to discuss restrictions /paperwork.

## 2021-03-15 NOTE — Progress Notes (Signed)
30 yo diagnosed with wound infection on 8/21 presenting today for wound check. Patient underwent an elective repeat c-section on 02/06/21. Patient reports no pain and no further drainage. She completed her antibiotics this morning. Patient denies any fevers or pain. Patient is otherwise doing well, both breast and formula feeding. She receives help from her mother. She denies signs/symptoms of postpartum depression.   Past Medical History:  Diagnosis Date   Anemia    Bacterial vaginosis 11/11/2015   History of   Chlamydia    Chronic kidney disease    h/o kidney stones   Family history of adverse reaction to anesthesia    mom unsure of what   Gestational diabetes    Hyperlipidemia    Irregular menses    Oligomenorrhea    PCOS (polycystic ovarian syndrome)    PCOS (polycystic ovarian syndrome)    Vaccine for human papilloma virus (HPV) types 6, 11, 16, and 18 administered    Vitamin D deficiency    Vitamin D deficiency    Past Surgical History:  Procedure Laterality Date   CESAREAN SECTION     CESAREAN SECTION N/A 02/06/2021   Procedure: CESAREAN SECTION;  Surgeon: Chancy Milroy, MD;  Location: [facility];  Service: Obstetrics;  Laterality: N/A;   HYSTEROSCOPY WITH D & C N/A 10/20/2015   Procedure: DILATATION AND CURETTAGE /HYSTEROSCOPY;  Surgeon: Malachy Mood, MD;  Location: [facility] ORS;  Service: Gynecology;  Laterality: N/A;   Family History  Problem Relation Age of Onset   Diabetes Mother        type II   Hyperlipidemia Mother    Hypertension Mother    GER disease Mother    Sleep apnea Mother    Hypertension Father    Hyperlipidemia Maternal Grandmother    Diabetes Maternal Grandmother        Type II   Cancer Maternal Grandfather        Lung cancer   Hypertension Paternal Grandmother    Brain cancer Paternal Grandmother    Lung cancer Paternal Grandmother    Cancer Paternal Grandfather    Diabetes Paternal Grandfather    Hyperlipidemia Paternal Grandfather     Cataracts Paternal Grandfather    Social History   Tobacco Use   Smoking status: Former    Packs/day: 0.25    Years: 8.00    Pack years: 2.00    Types: Cigarettes   Smokeless tobacco: Never   Tobacco comments:    quit 2019  Vaping Use   Vaping Use: Never used  Substance Use Topics   Alcohol use: No   Drug use: No   ROS See pertinent in HPI. All other systems reviewed and non contributory Blood pressure 111/76, pulse 80, weight 212 lb (96.2 kg), currently breastfeeding.  GENERAL: Well-developed, well-nourished female in no acute distress.  ABDOMEN: Soft, nontender, nondistended. No organomegaly. Incision: no erythema, induration or drainage- incision healed well  EXTREMITIES: No cyanosis, clubbing, or edema, 2+ distal pulses.  A/P 30 yo here for wound check - Reassurance provided - Follow up as scheduled for postpartum visit with fasting glucola - Patient plans POP for contraception RTC next week

## 2021-03-23 ENCOUNTER — Other Ambulatory Visit: Payer: BLUE CROSS/BLUE SHIELD

## 2021-03-23 ENCOUNTER — Ambulatory Visit (INDEPENDENT_AMBULATORY_CARE_PROVIDER_SITE_OTHER): Payer: BLUE CROSS/BLUE SHIELD | Admitting: Women's Health

## 2021-03-23 ENCOUNTER — Other Ambulatory Visit: Payer: Self-pay

## 2021-03-23 VITALS — BP 114/76 | HR 64 | Ht 63.0 in | Wt 217.0 lb

## 2021-03-23 DIAGNOSIS — O4690 Antepartum hemorrhage, unspecified, unspecified trimester: Secondary | ICD-10-CM

## 2021-03-23 DIAGNOSIS — Z8632 Personal history of gestational diabetes: Secondary | ICD-10-CM

## 2021-03-23 NOTE — Progress Notes (Signed)
[place] Partum Visit Note  Susan Hubbard is a 30 y.o. G29P1031 female who presents for a postpartum visit. She is 6 weeks postpartum following a repeat cesarean section.  I have fully reviewed the prenatal and intrapartum course. The delivery was at 39.1 gestational weeks.  Anesthesia: spinal. Postpartum course has been complicated by anemia and postpartum wound infection. Baby is doing well. Baby is feeding by both breast and bottle - Enfamil Gentle ease . Bleeding no bleeding. Bowel function is normal. Bladder function is normal. Patient is not sexually active. Contraception method is none. Postpartum depression screening: negative.  The pregnancy intention screening data noted above was reviewed. Potential methods of contraception were discussed. The patient elected to proceed with POP per her conversation with Dr. Dione Plover.   Edinburgh Postnatal Depression Scale - 03/23/21 0922       Edinburgh Postnatal Depression Scale:  In the Past 7 Days   I have been able to laugh and see the funny side of things. 0    I have looked forward with enjoyment to things. 0    I have blamed myself unnecessarily when things went wrong. 0    I have been anxious or worried for no good reason. 0    I have felt scared or panicky for no good reason. 0    Things have been getting on top of me. 0    I have been so unhappy that I have had difficulty sleeping. 0    I have felt sad or miserable. 0    I have been so unhappy that I have been crying. 0    The thought of harming myself has occurred to me. 0    Edinburgh Postnatal Depression Scale Total 0             Health Maintenance Due  Topic Date Due   COVID-19 Vaccine (1) Never done   Pneumococcal Vaccine 83-42 Years old (1 - PCV) Never done   URINE MICROALBUMIN  Never done   INFLUENZA VACCINE  02/15/2021    The following portions of the patient's history were reviewed and updated as appropriate: allergies, current medications, past family history,  past medical history, past social history, past surgical history, and problem list.  Review of Systems Pertinent items noted in HPI and remainder of comprehensive ROS otherwise negative.  Objective:  BP 114/76   Pulse 64   Ht '5\' 3"'$  (1.6 m)   Wt 217 lb (98.4 kg)   Breastfeeding Yes   BMI 38.44 kg/m    General:  alert, cooperative, and no distress   Breasts:  declined  Lungs: clear to auscultation bilaterally  Heart:  regular rate and rhythm, S1, S2 normal, no murmur, click, rub or gallop  Abdomen: soft, non-tender; bowel sounds normal; no masses,  no organomegaly and incision well-healed, no evidence of infection    Wound well approximated incision  GU exam:  normal external exam and limited internal exam       Assessment:   1. History of gestational diabetes - Glucose tolerance, 2 hours  2. Postpartum exam -reschedule Pap to November  3. OB Hemorrhage - CBC  Normal postpartum exam.   Plan:   Essential components of care per ACOG recommendations:  1.  Mood and well being: Patient with negative depression screening today. Reviewed local resources for support.  - Patient tobacco use? No.   - hx of drug use? No.    2. Infant care and feeding:  -Patient currently  breastmilk feeding? Yes. Discussed returning to work and pumping. Reviewed importance of draining breast regularly to support lactation. Patient needs a work note. Patient was provided letter for work to allow for every 2-3 hr pumping breaks, and to be granted a private location to express breastmilk and refrigerated area to store breastmilk.  -Social determinants of health (SDOH) reviewed in EPIC.  3. Sexuality, contraception and birth spacing - Patient does not want a pregnancy in the next year.  Desired family size is 2 children.  - Reviewed forms of contraception in tiered fashion. Patient desired oral progesterone-only contraceptive today.   - Discussed birth spacing of 18 months - pt comfortable with POPs  at this time and understands she must take the pill at exactly the same time each day, information on contraception given, reviewed LARCs, pt to return to office in future if desiring alternative method.  4. Sleep and fatigue -Encouraged family/partner/community support of 4 hrs of uninterrupted sleep to help with mood and fatigue  5. Physical Recovery  - Discussed patients delivery and complications. She describes her labor as mixed. - Patient had a C-section. Patient had a  pph of 131m blood loss . Perineal healing reviewed. Patient expressed understanding - Patient has urinary incontinence? No. - Patient is safe to resume physical and sexual activity  6.  Health Maintenance - HM due items addressed Yes - Last pap smear  Diagnosis  Date Value Ref Range Status  05/25/2018   Final   NEGATIVE FOR INTRAEPITHELIAL LESIONS OR MALIGNANCY.  05/25/2018   Final   FUNGAL ORGANISMS PRESENT CONSISTENT WITH CANDIDA SPP.   Pap smear not done at today's visit. Pt unable to tolerate speculum exam and wishes to reschedule to another day. -Breast Cancer screening indicated? No.   7. Chronic Disease/Pregnancy Condition follow up: Gestational Diabetes  - PCP follow up  NClarisa Fling NP [facility]

## 2021-03-23 NOTE — Patient Instructions (Addendum)
Contraception Choices - www.bedsider.org Contraception, also called birth control, refers to methods or devices that prevent pregnancy. Hormonal methods Contraceptive implant A contraceptive implant is a thin, plastic tube that contains a hormone that prevents pregnancy. It is different from an intrauterine device (IUD). It is inserted into the upper part of the arm by a health care provider. Implants can be effective for up to 3 years. Progestin-only injections Progestin-only injections are injections of progestin, a synthetic form of the hormone progesterone. They are given every 3 months by a health care provider. Birth control pills Birth control pills are pills that contain hormones that prevent pregnancy. They must be taken once a day, preferably at the same time each day. A prescription is needed to use this method of contraception. Birth control patch The birth control patch contains hormones that prevent pregnancy. It is placed on the skin and must be changed once a week for three weeks and removed on the fourth week. A prescription is needed to use this method of contraception. Vaginal ring A vaginal ring contains hormones that prevent pregnancy. It is placed in the vagina for three weeks and removed on the fourth week. After that, the process is repeated with a new ring. A prescription is needed to use this method of contraception. Emergency contraceptive Emergency contraceptives prevent pregnancy after unprotected sex. They come in pill form and can be taken up to 5 days after sex. They work best the sooner they are taken after having sex. Most emergency contraceptives are available without a prescription. This method should not be used as your only form of birth control. Barrier methods Female condom A female condom is a thin sheath that is worn over the penis during sex. Condoms keep sperm from going inside a woman's body. They can be used with a sperm-killing substance (spermicide) to  increase their effectiveness. They should be thrown away after one use. Female condom A female condom is a soft, loose-fitting sheath that is put into the vagina before sex. The condom keeps sperm from going inside a woman's body. They should be thrown away after one use. Diaphragm A diaphragm is a soft, dome-shaped barrier. It is inserted into the vagina before sex, along with a spermicide. The diaphragm blocks sperm from entering the uterus, and the spermicide kills sperm. A diaphragm should be left in the vagina for 6-8 hours after sex and removed within 24 hours. A diaphragm is prescribed and fitted by a health care provider. A diaphragm should be replaced every 1-2 years, after giving birth, after gaining more than 15 lb (6.8 kg), and after pelvic surgery. Cervical cap A cervical cap is a round, soft latex or plastic cup that fits over the cervix. It is inserted into the vagina before sex, along with spermicide. It blocks sperm from entering the uterus. The cap should be left in place for 6-8 hours after sex and removed within 48 hours. A cervical cap must be prescribed and fitted by a health care provider. It should be replaced every 2 years. Sponge A sponge is a soft, circular piece of polyurethane foam with spermicide in it. The sponge helps block sperm from entering the uterus, and the spermicide kills sperm. To use it, you make it wet and then insert it into the vagina. It should be inserted before sex, left in for at least 6 hours after sex, and removed and thrown away within 30 hours. Spermicides Spermicides are chemicals that kill or block sperm from entering the cervix  and uterus. They can come as a cream, jelly, suppository, foam, or tablet. A spermicide should be inserted into the vagina with an applicator at least [id number] minutes before sex to allow time for it to work. The process must be repeated every time you have sex. Spermicides do not require a prescription. Intrauterine  contraception Intrauterine device (IUD) An IUD is a T-shaped device that is put in a woman's uterus. There are two types: Hormone IUD.This type contains progestin, a synthetic form of the hormone progesterone. This type can stay in place for 3-5 years. Copper IUD.This type is wrapped in copper wire. It can stay in place for 10 years. Permanent methods of contraception Female tubal ligation In this method, a woman's fallopian tubes are sealed, tied, or blocked during surgery to prevent eggs from traveling to the uterus. Hysteroscopic sterilization In this method, a small, flexible insert is placed into each fallopian tube. The inserts cause scar tissue to form in the fallopian tubes and block them, so sperm cannot reach an egg. The procedure takes about 3 months to be effective. Another form of birth control must be used during those 3 months. Female sterilization This is a procedure to tie off the tubes that carry sperm (vasectomy). After the procedure, the man can still ejaculate fluid (semen). Another form of birth control must be used for 3 months after the procedure. Natural planning methods Natural family planning In this method, a couple does not have sex on days when the woman could become pregnant. Calendar method In this method, the woman keeps track of the length of each menstrual cycle, identifies the days when pregnancy can happen, and does not have sex on those days. Ovulation method In this method, a couple avoids sex during ovulation. Symptothermal method This method involves not having sex during ovulation. The woman typically checks for ovulation by watching changes in her temperature and in the consistency of cervical mucus. Post-ovulation method In this method, a couple waits to have sex until after ovulation. Where to find more information Centers for Disease Control and Prevention: http://www.wolf.info/ Summary Contraception, also called birth control, refers to methods or devices  that prevent pregnancy. Hormonal methods of contraception include implants, injections, pills, patches, vaginal rings, and emergency contraceptives. Barrier methods of contraception can include female condoms, female condoms, diaphragms, cervical caps, sponges, and spermicides. There are two types of IUDs (intrauterine devices). An IUD can be put in a woman's uterus to prevent pregnancy for 3-5 years. Permanent sterilization can be done through a procedure for males and females. Natural family planning methods involve nothaving sex on days when the woman could become pregnant. This information is not intended to replace advice given to you by your health care provider. Make sure you discuss any questions you have with your health care provider. Document Revised: 12/09/2019 Document Reviewed: 12/09/2019 Elsevier Patient Education  2022 [place] [phone]) [place] [address] [place], [place] [address] [phone] Mon-Fri 8:30-12:30, 1:30-5:00 Accepting [facility] at [place], [place], [place] [address] [phone] Mon-Fri 8:00-5:30 [place] [facility] [address], [place], [place] [address] [phone] Mechele Dawley, Thur, Fri 8:30-5:00, Wed 10:00-7:00 (closed 1-2pm) Accepting [facility] [place]. [address], [place], [place]  [address] Phone - [phone]   Fax - [phone]  [place] [phone]) [facility] [address], [place], [place] [address] [phone]  Mon-Fri 8:00-5:00 [facility] at [facility])  [address] Barbara Cower [place], [place] [address] [phone] Mon-Fri 8:30-5:30, Sat (Oct.-Mar.) 9:00-1:00 Accepting Medicaid  [place] [phone]) [place] at [place],  [place], [place] [address] [phone] Mon-Fri 8:00-5:00  [place] [phone]) [place] at [facility] [address] [place], [place] [address] [phone] Mon-Fri 8:00-5:00 [facility] at [place], [place], [place] [address] [phone] Mon-Fri 8:00-5:00 [place] at [facility] [address] Madelaine Bhat [place], [place] [address] [phone] Mon-Fri 8:00-5:00 [place] [place] [address] [phone] Mon-Fri 7:30-5:30  [place] [phone] & [phone]) [facility] [address] [place], [place] [address] [phone] Mon-Thur 8:00-6:00 Accepting Medicaid [place] [address] Madelaine Bhat [place], [place] [address] [phone] Mon-Thur 7:30-7:30, Fri 7:30-4:30 Accepting [facility] at [place]. [address] [place], [place]  [address] [phone]   Fax - [place] [phone] & [phone]) [place] at [place]., [place], [place] [address] [phone] Mon-Fri 7:00-5:00 [place] [address], [place], [place] [address] [phone] Mon-Fri 8:00-5:00 Accepting Medicaid [place] [address] [place], [place], [place] [address] [phone] Mon-Fri 8:00-5:00 Accepting Medicaid  [place] [phone]) [facility] at [facility] [address] Madelaine Bhat [place], [place] [address] [phone] Mon-Fri 8:00-5:00 [place] ([place] at [facility]) [address], [place], [place] [address] [phone] Mon-Fri 8:00-5:00 Accepting Medicaid [place] ([facility] [employer]) [address] [place], [place], [place]  [address] [phone] Mon-Fri 8:00-5:30, Sat&Sun by appointment (phones open at 8:30) Accepting [facility] [phone] & [phone]) [place] [address], [place], [place] [address] [phone] Mon-Thur 8:00-7:00, Fri 8:00-5:00, Sat 8:00-12:00, Sun 9:00-12:00 Accepting Medicaid [facility] at [facility] [address] [place], [place], [place] [address] [phone] Mon-Thur 8:00-5:00 Accepting Medicaid [facility] at [place] [place], [place] [address] [phone] Mon-Fri 8:00-5:30, Sat (Oct.-Mar.) 9:00-1:00 Accepting [facility]  [place] [phone]) [place] [address] [place], [place], [place] [address] [phone] Mon-Fri 8:00-5:00 Accepting [facility]   [place] [phone]) [place] at [facility] [address], [place], [place] [address] [phone] Mon-Fri 8:00-5:00 [place] at [facility] [address] Marolyn Hammock [place], [place] [address] [phone] Mon-Fri 8:00-5:00 [place] [address], [place], [place] [address] [phone] Mon-Fri 8:00-5:00 After hours clinic [facility] [place], [place] [address]) [phone] Mon-Fri 5:00-8:00, Sat 12:00-6:00, Sun 10:00-4:00 Accepting Medicaid [place] at [facility]. [address], [place], [place]  [address] [phone]   Fax - [phone]  [place] [phone]) [place] at [facility] [address] [place], [place],  [address] [phone] Mon-Fri 8:00-5:00 [place] ([place] at [place]) [address] [place], [place],  [address] [phone] Mon-Thur 8:00-7:00, Fri 8:00-5:00, Sat 8:00-12:00

## 2021-03-24 LAB — CBC
Hematocrit: 36.2 % (ref 34.0–46.6)
Hemoglobin: 11.4 g/dL (ref 11.1–15.9)
MCH: 26.1 pg — ABNORMAL LOW (ref 26.6–33.0)
MCHC: 31.5 g/dL (ref 31.5–35.7)
MCV: 83 fL (ref 79–97)
Platelets: 315 10*3/uL (ref 150–450)
RBC: 4.36 x10E6/uL (ref 3.77–5.28)
RDW: 14.1 % (ref 11.7–15.4)
WBC: 6.7 10*3/uL (ref 3.4–10.8)

## 2021-03-24 LAB — GLUCOSE TOLERANCE, 2 HOURS
Glucose, 2 hour: 127 mg/dL (ref 65–139)
Glucose, GTT - Fasting: 84 mg/dL (ref 65–99)

## 2021-06-01 ENCOUNTER — Ambulatory Visit: Payer: BLUE CROSS/BLUE SHIELD | Admitting: Obstetrics

## 2021-06-08 ENCOUNTER — Ambulatory Visit: Payer: BLUE CROSS/BLUE SHIELD | Admitting: Obstetrics

## 2021-06-15 ENCOUNTER — Ambulatory Visit: Payer: BLUE CROSS/BLUE SHIELD | Admitting: Obstetrics

## 2021-10-02 IMAGING — US US FETAL BPP W/ NON-STRESS
1 series · 13 of 13 positions shown · non-contrast
Comparison: none

[Series 1: us fetal bpp w/ non-stress · 13 acquisitions, 13 frames shown]
[im 1/13]
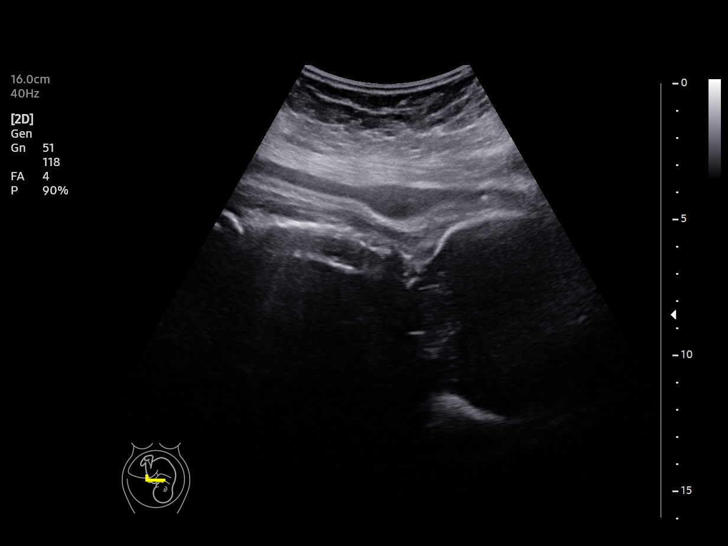
[im 2/13]
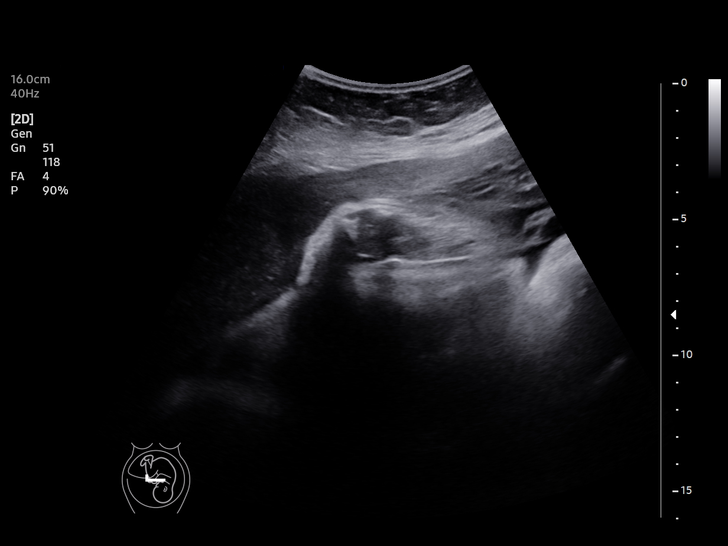
[im 3/13]
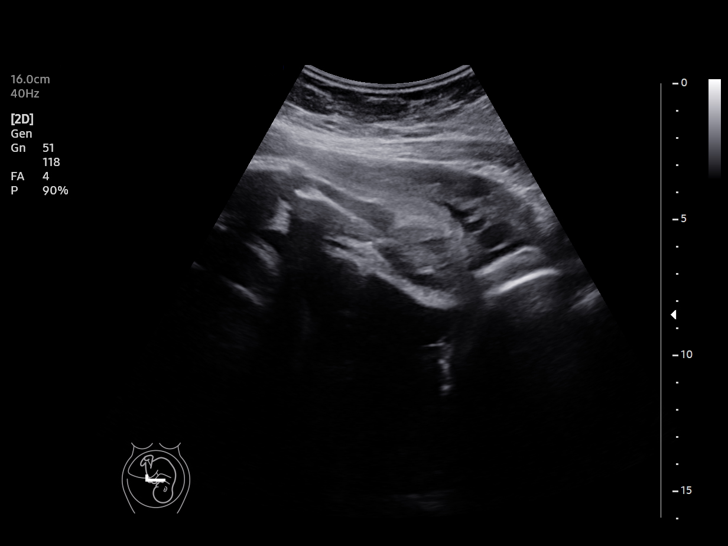
[im 4/13]
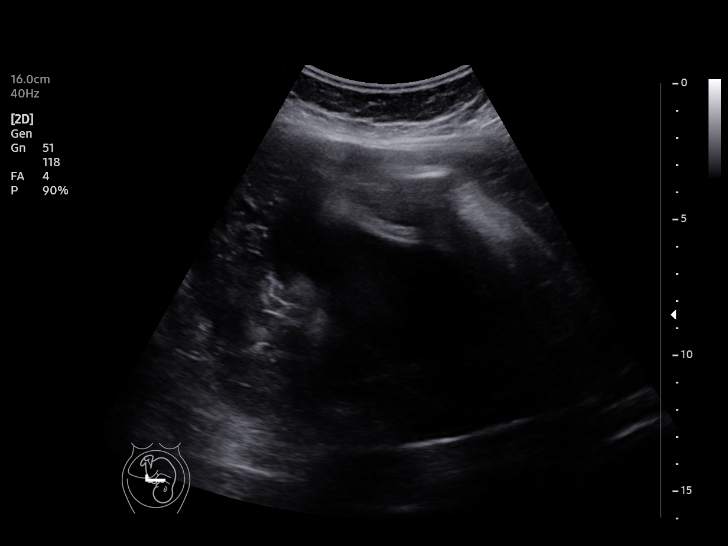
[im 5/13]
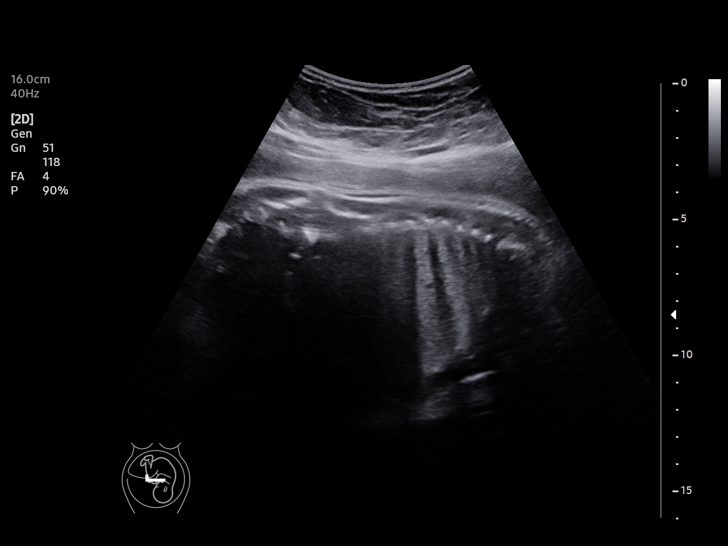
[im 6/13]
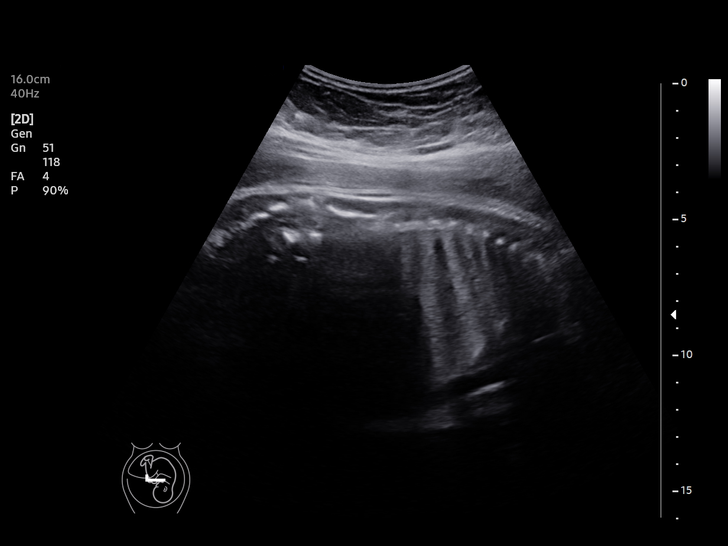
[im 7/13]
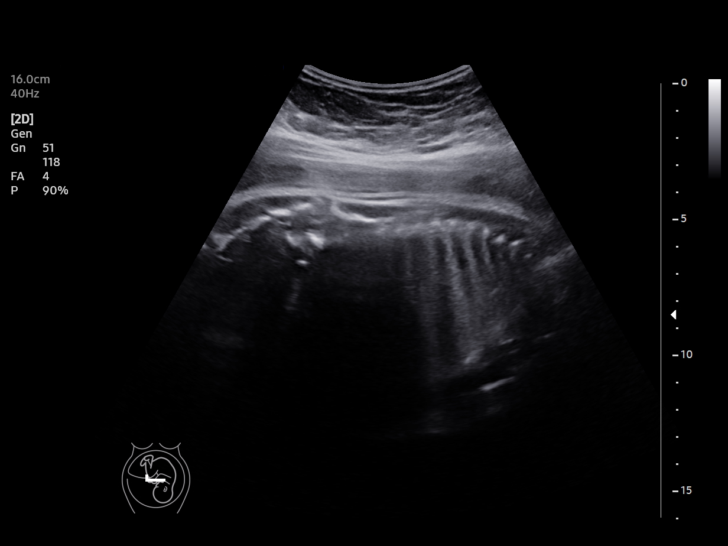
[im 8/13]
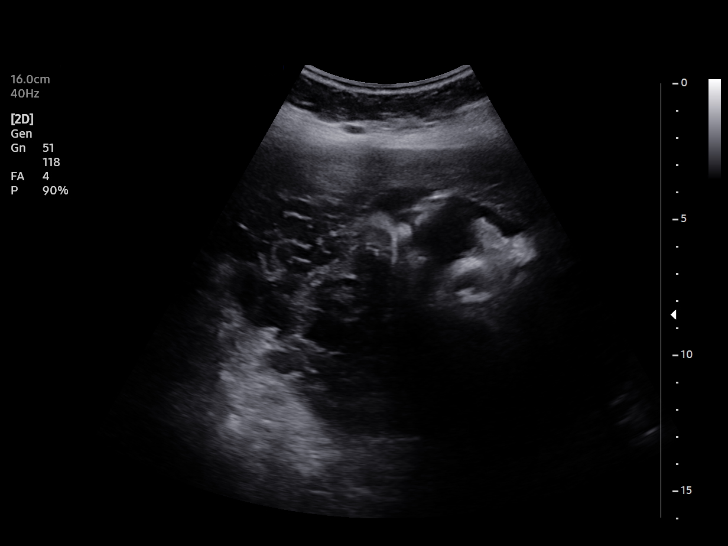
[im 9/13]
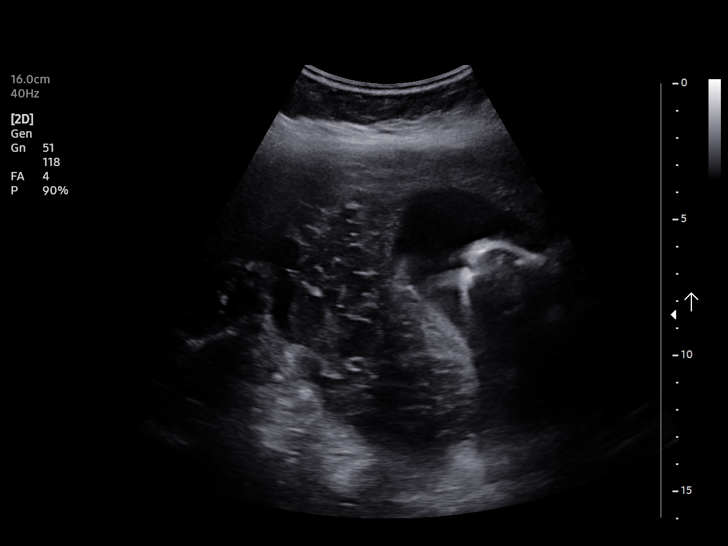
[im 10/13]
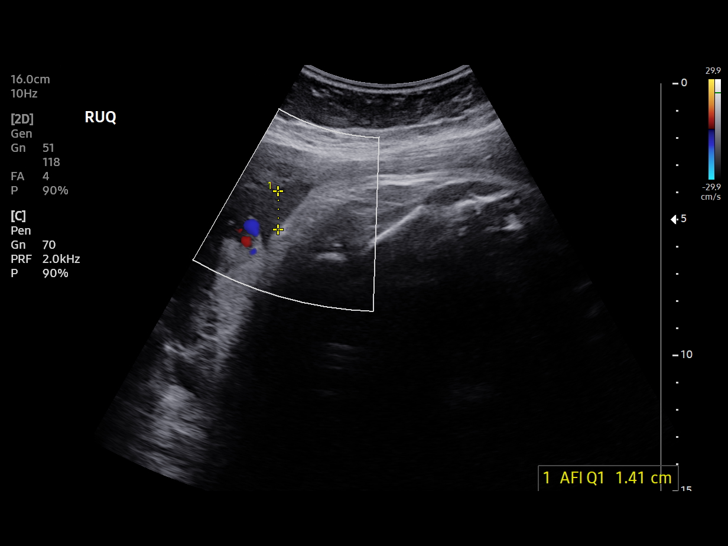
[im 11/13]
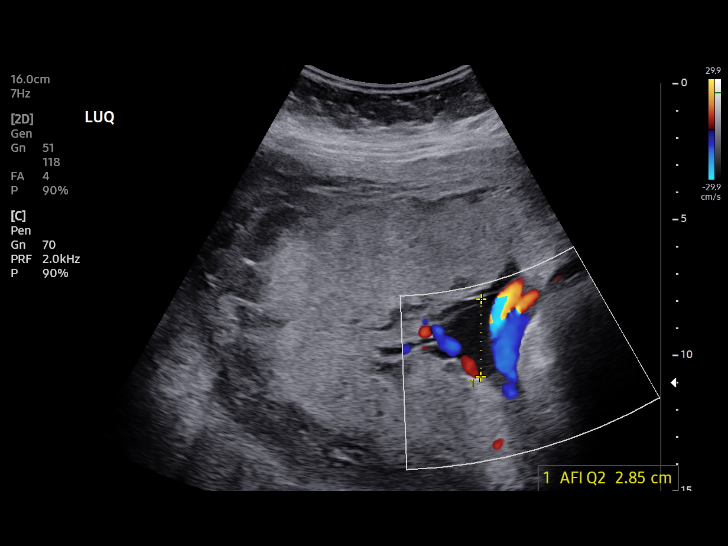
[im 12/13]
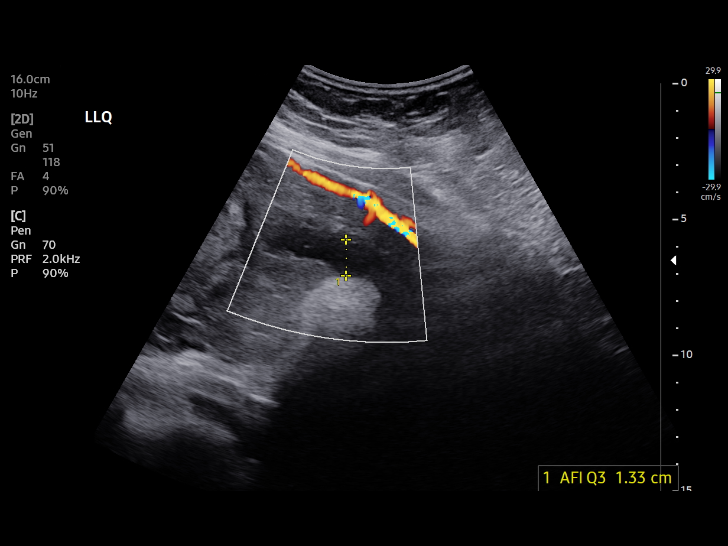
[im 13/13]
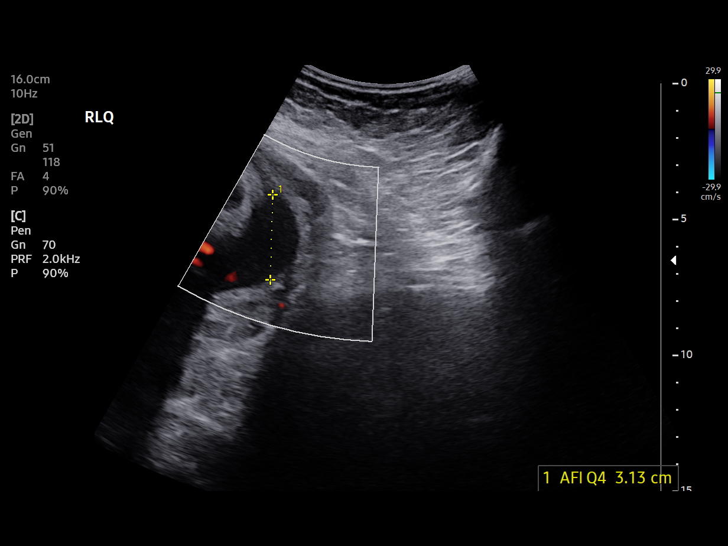

[13 of 13 positions shown; findings below may reference images not displayed]

[REDACTED]care at

Indications

 35 weeks gestation of pregnancy
 Gestational diabetes in pregnancy, diet
 controlled
Fetal Evaluation

 Num Of Fetuses:         1
 Preg. Location:         Intrauterine
 Cardiac Activity:       Observed
 Fetal Lie:              Maternal left side
 Presentation:           Cephalic

 Amniotic Fluid
 AFI FV:      Subjectively low-normal

 AFI Sum(cm)     %Tile       Largest Pocket(cm)
 8.72            12

 RUQ(cm)       RLQ(cm)       LUQ(cm)        LLQ(cm)

Biophysical Evaluation

 Amniotic F.V:   Pocket => 2 cm             F. Tone:        Observed
 F. Movement:    Observed                   N.S.T:          Reactive
 F. Breathing:   Observed                   Score:          [DATE]
OB History

 Gravidity:    5         Term:   1         SAB:   1
 TOP:          2        Living:  1
Gestational Age

 LMP:           35w 5d        Date:  05/08/20                 EDD:   02/12/21
 Clinical EDD:  35w 5d                                        EDD:   02/12/21
 Best:          35w 5d     Det. By:  LMP  (05/08/20)          EDD:   02/12/21
Impression

 Antenatal testing due to GDM with uncertain control.
 Testing is reassuring, BPP [DATE].
Recommendations

 Continue weekly antenatal testing till delivery .
 Per MFM can discontinue if patient demonstrates adequate
 control of her blood sugars.
               [place], [place]

## 2021-10-23 IMAGING — US US FETAL BPP W/ NON-STRESS
1 series · 13 of 13 positions shown · non-contrast
Comparison: none

[Series 1: us fetal bpp w/ non-stress · 13 acquisitions, 13 frames shown]
[im 1/13]
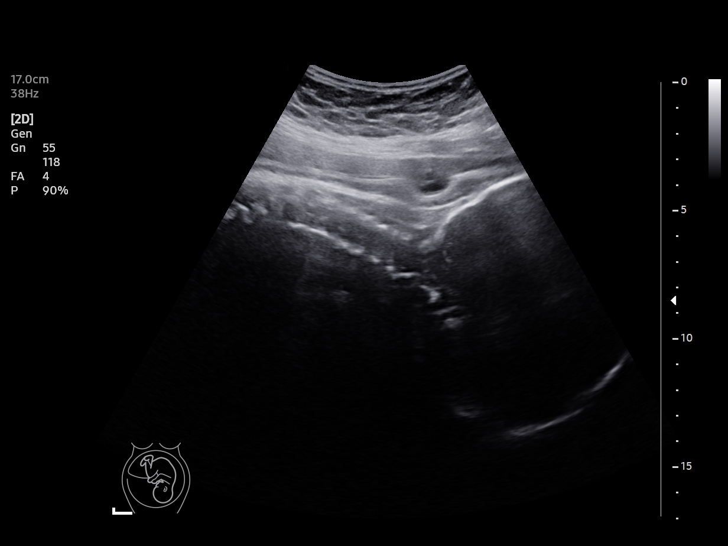
[im 2/13]
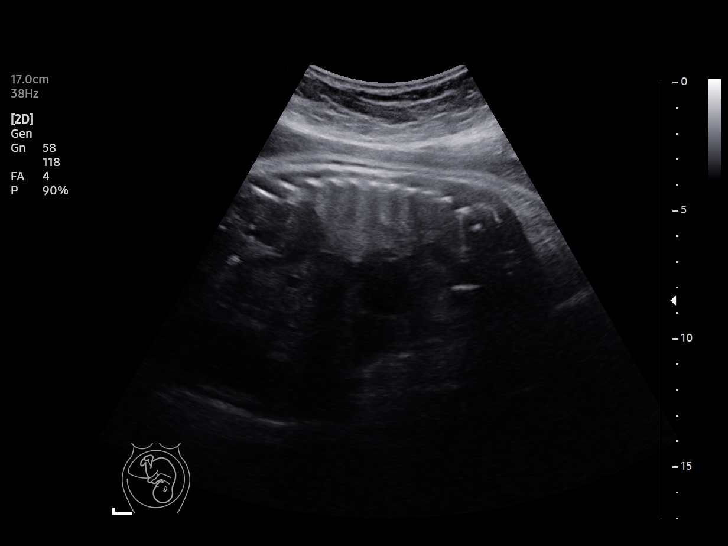
[im 3/13]
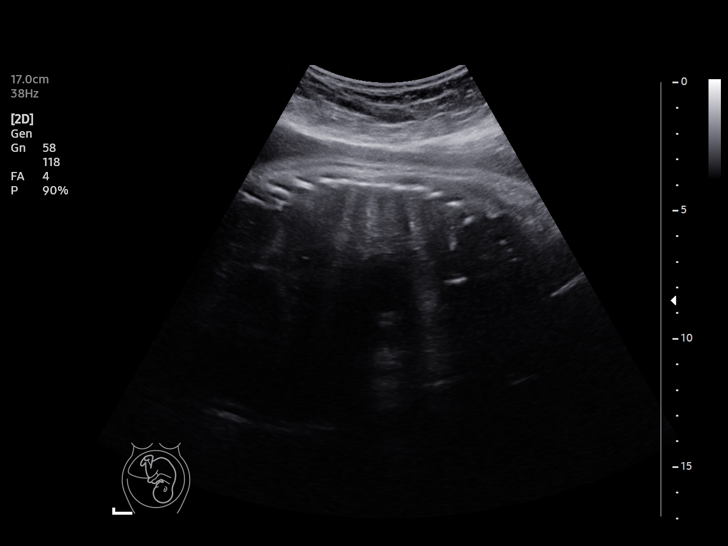
[im 4/13]
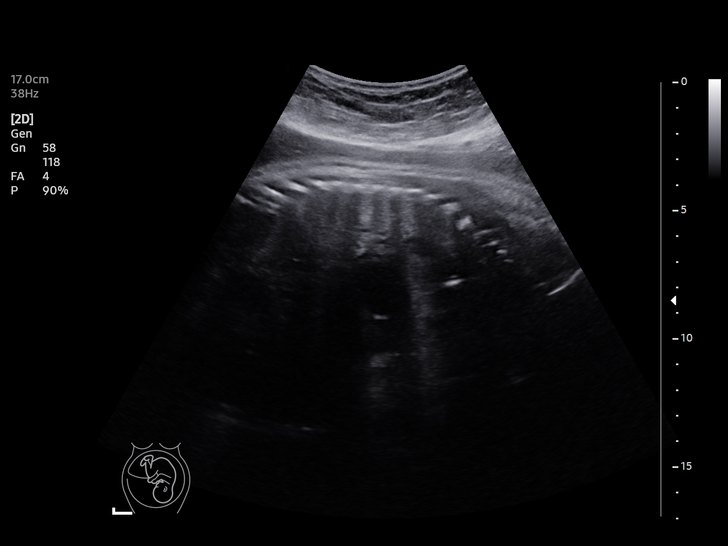
[im 5/13]
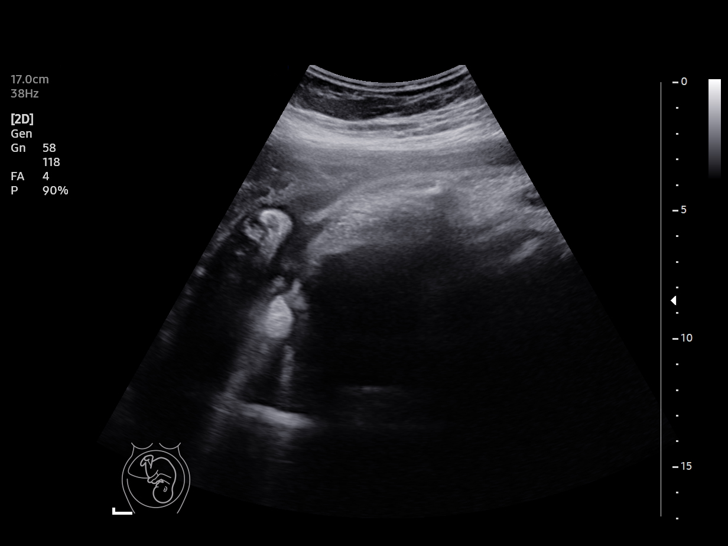
[im 6/13]
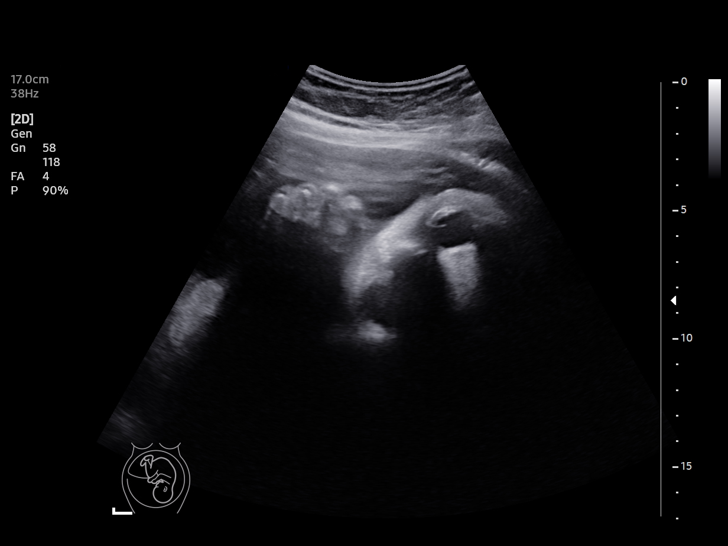
[im 7/13]
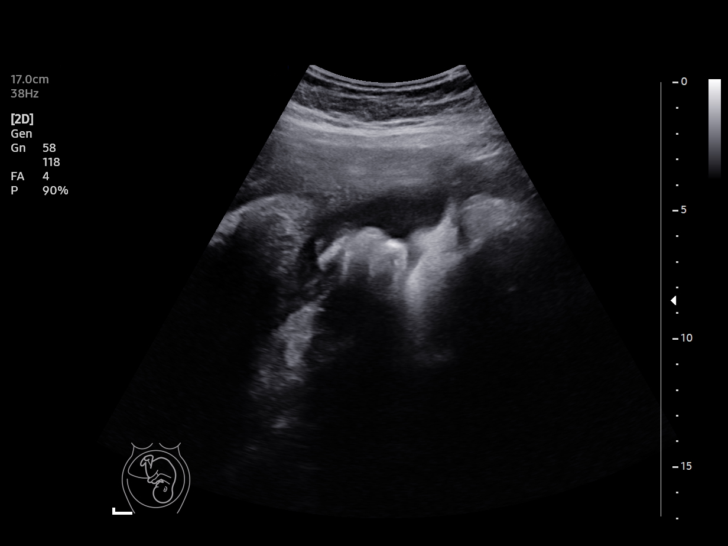
[im 8/13]
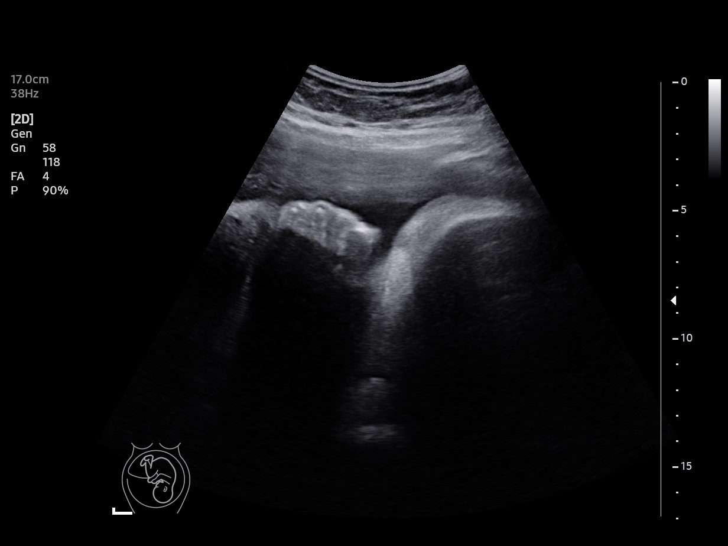
[im 9/13]
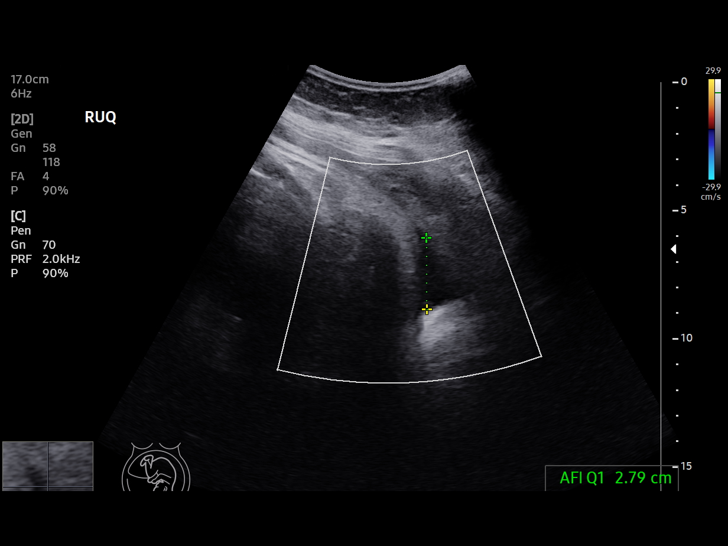
[im 10/13]
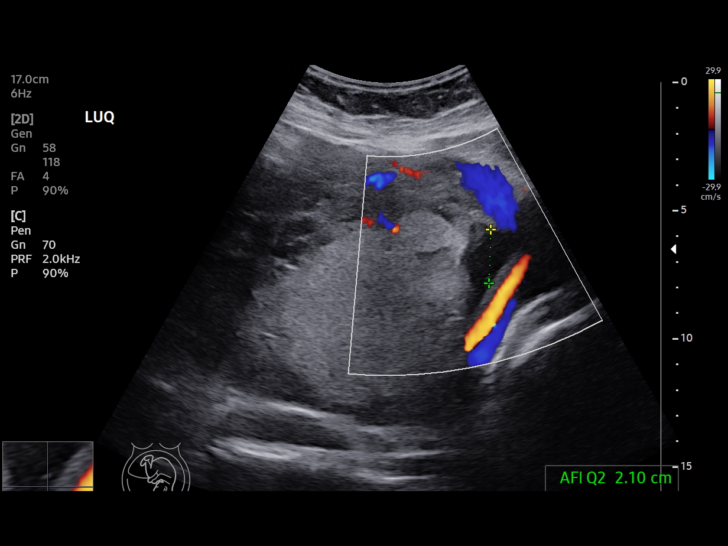
[im 11/13]
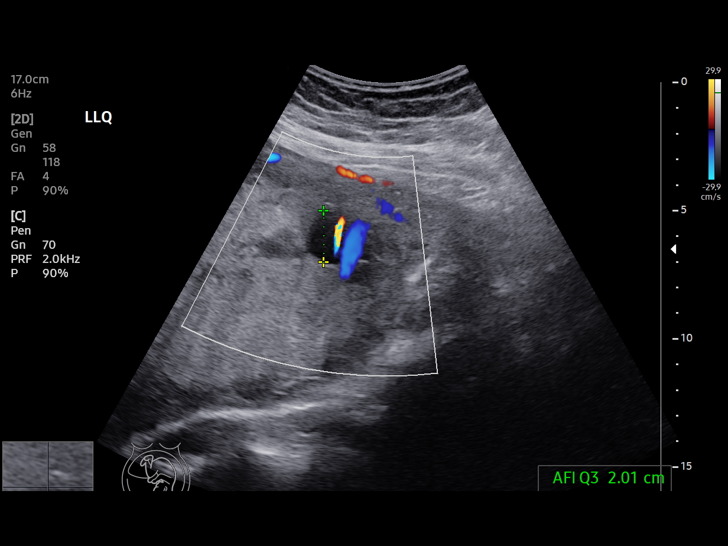
[im 12/13]
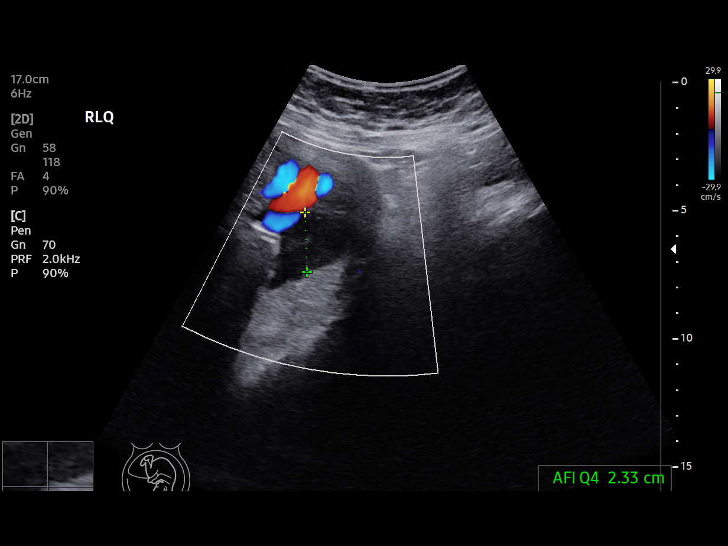
[im 13/13]
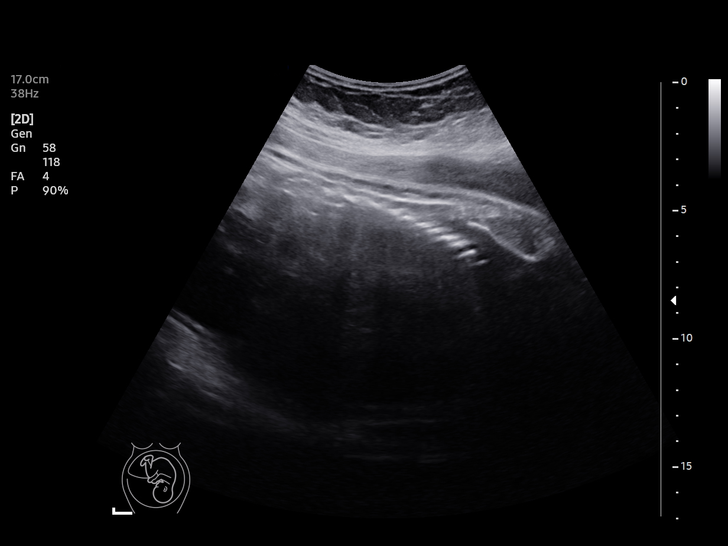

[13 of 13 positions shown; findings below may reference images not displayed]

[REDACTED]care at

Service(s) Provided

Indications

 38 weeks gestation of pregnancy
 Obesity complicating pregnancy, third
 trimester
Fetal Evaluation

 Num Of Fetuses:         1
 Preg. Location:         Intrauterine
 Cardiac Activity:       Observed
 Presentation:           Cephalic

 Amniotic Fluid
 AFI FV:      Within normal limits

 AFI Sum(cm)     %Tile       Largest Pocket(cm)
 9.23            21

 RUQ(cm)       RLQ(cm)       LUQ(cm)        LLQ(cm)

Biophysical Evaluation
 Amniotic F.V:   Pocket => 2 cm             F. Tone:        Observed
 F. Movement:    Observed                   N.S.T:          Reactive
 F. Breathing:   Observed                   Score:          [DATE]
OB History

 Gravidity:    5         Term:   1         SAB:   1
 TOP:          2        Living:  1
Gestational Age

 LMP:           38w 5d        Date:  05/08/20                 EDD:   02/12/21
 Clinical EDD:  38w 5d                                        EDD:   02/12/21
 Best:          38w 5d     Det. By:  LMP  (05/08/20)          EDD:   02/12/21
Impression

                  Soeprapto Jelita, DO

## 2021-10-26 DIAGNOSIS — K219 Gastro-esophageal reflux disease without esophagitis: Secondary | ICD-10-CM | POA: Diagnosis not present

## 2021-10-26 DIAGNOSIS — Z6841 Body Mass Index (BMI) 40.0 and over, adult: Secondary | ICD-10-CM | POA: Diagnosis not present

## 2022-01-28 ENCOUNTER — Emergency Department: Payer: Federal, State, Local not specified - PPO

## 2022-01-28 ENCOUNTER — Encounter: Payer: Self-pay | Admitting: Emergency Medicine

## 2022-01-28 ENCOUNTER — Emergency Department
Admission: EM | Admit: 2022-01-28 | Discharge: 2022-01-28 | Disposition: A | Discharge status: Home / Self Care | Payer: Federal, State, Local not specified - PPO | Attending: Emergency Medicine | Admitting: Emergency Medicine

## 2022-01-28 DIAGNOSIS — Z87442 Personal history of urinary calculi: Secondary | ICD-10-CM | POA: Diagnosis not present

## 2022-01-28 DIAGNOSIS — N189 Chronic kidney disease, unspecified: Secondary | ICD-10-CM | POA: Diagnosis not present

## 2022-01-28 DIAGNOSIS — R072 Precordial pain: Secondary | ICD-10-CM | POA: Insufficient documentation

## 2022-01-28 DIAGNOSIS — R0789 Other chest pain: Secondary | ICD-10-CM | POA: Diagnosis not present

## 2022-01-28 DIAGNOSIS — R079 Chest pain, unspecified: Secondary | ICD-10-CM | POA: Diagnosis not present

## 2022-01-28 LAB — HEPATIC FUNCTION PANEL
ALT: 16 U/L (ref 0–44)
AST: 14 U/L — ABNORMAL LOW (ref 15–41)
Albumin: 3.6 g/dL (ref 3.5–5.0)
Alkaline Phosphatase: 70 U/L (ref 38–126)
Bilirubin, Direct: <0.1 mg/dL (ref 0.0–0.2)
Total Bilirubin: 0.3 mg/dL (ref 0.3–1.2)
Total Protein: 6.8 g/dL (ref 6.5–8.1)

## 2022-01-28 LAB — CBC
HCT: 36.7 % (ref 36.0–46.0)
Hemoglobin: 11.5 g/dL — ABNORMAL LOW (ref 12.0–15.0)
MCH: 26.7 pg (ref 26.0–34.0)
MCHC: 31.3 g/dL (ref 30.0–36.0)
MCV: 85.3 fL (ref 80.0–100.0)
Platelets: 331 10*3/uL (ref 150–400)
RBC: 4.3 MIL/uL (ref 3.87–5.11)
RDW: 13.3 % (ref 11.5–15.5)
WBC: 8.3 10*3/uL (ref 4.0–10.5)
nRBC: 0 % (ref 0.0–0.2)

## 2022-01-28 LAB — BASIC METABOLIC PANEL
Anion gap: 3 — ABNORMAL LOW (ref 5–15)
BUN: 14 mg/dL (ref 6–20)
CO2: 25 mmol/L (ref 22–32)
Calcium: 9.2 mg/dL (ref 8.9–10.3)
Chloride: 111 mmol/L (ref 98–111)
Creatinine, Ser: 0.91 mg/dL (ref 0.44–1.00)
GFR, Estimated: >60 mL/min (ref >60)
Glucose, Bld: 114 mg/dL — ABNORMAL HIGH (ref 70–99)
Potassium: 3.9 mmol/L (ref 3.5–5.1)
Sodium: 139 mmol/L (ref 135–145)

## 2022-01-28 LAB — LIPASE, BLOOD: Lipase: 28 U/L (ref 11–51)

## 2022-01-28 LAB — POC URINE PREG, ED: Preg Test, Ur: NEGATIVE

## 2022-01-28 LAB — TROPONIN I (HIGH SENSITIVITY)
Troponin I (High Sensitivity): <2 ng/L (ref <18)
Troponin I (High Sensitivity): 4 ng/L (ref <18)

## 2022-01-28 LAB — D-DIMER, QUANTITATIVE: D-Dimer, Quant: <0.27 ug/mL-FEU (ref 0.00–0.50)

## 2022-01-28 MED ORDER — NAPROXEN 500 MG PO TABS: 500.0000 mg | ORAL_TABLET | Freq: Two times a day (BID) | ORAL | 0 refills | Status: DC

## 2022-01-28 MED ORDER — KETOROLAC TROMETHAMINE 30 MG/ML IJ SOLN
15.0000 mg | Freq: Once | INTRAMUSCULAR | Status: DC
  Filled 2022-01-28: qty 1

## 2022-01-28 NOTE — ED Triage Notes (Signed)
Pt presents via POV with complaints of tight mid sternal pain that started tonight with radiation to her back. No meds taken PTA. Denies N/V, SOB or fevers.

## 2022-01-28 NOTE — ED Provider Notes (Signed)
[facility] Provider Note    Event Date/Time   First MD Initiated Contact with Patient 01/28/22 [phone]     (approximate)   History   Chest Pain   HPI  Susan Hubbard is a 31 y.o. female  who presents to the ED from home with a chief complaint of chest pain. Reports substernal chest pain onset approximately 10 PM, constant with radiation to her back.  Denies associated diaphoresis, shortness of breath, nausea/vomiting, palpitations or dizziness.  Denies recent travel, trauma or hormone use.     Past Medical History   Past Medical History:  Diagnosis Date   Anemia    Bacterial vaginosis 11/11/2015   History of   Chlamydia    Chronic kidney disease    h/o kidney stones   Family history of adverse reaction to anesthesia    mom unsure of what   Gestational diabetes    Hyperlipidemia    Irregular menses    Oligomenorrhea    PCOS (polycystic ovarian syndrome)    PCOS (polycystic ovarian syndrome)    Vaccine for human papilloma virus (HPV) types 6, 11, 16, and 18 administered    Vitamin D deficiency    Vitamin D deficiency      Active Problem List   Patient Active Problem List   Diagnosis Date Noted   OB Hemorrhage 02/08/2021   History of cesarean section 02/07/2021   History of gestational diabetes 12/02/2020   Pure hypercholesterolemia 02/05/2015   Kidney stones 02/05/2015   Sleep disturbance 02/05/2015   Acne 02/05/2015   Eczema 02/05/2015   History of PCOS 02/05/2015     Past Surgical History   Past Surgical History:  Procedure Laterality Date   CESAREAN SECTION     CESAREAN SECTION N/A 02/06/2021   Procedure: CESAREAN SECTION;  Surgeon: Chancy Milroy, MD;  Location: [facility] ORS;  Service: Obstetrics;  Laterality: N/A;   HYSTEROSCOPY WITH D & C N/A 10/20/2015   Procedure: DILATATION AND CURETTAGE /HYSTEROSCOPY;  Surgeon: Malachy Mood, MD;  Location: [facility] ORS;  Service: Gynecology;  Laterality: N/A;     Home Medications    Prior to Admission medications   Medication Sig Start Date End Date Taking? Authorizing Provider  acetaminophen (TYLENOL) 325 MG tablet Take 2 tablets (650 mg total) by mouth every 4 (four) hours as needed. Patient not taking: Reported on 03/15/2021 02/08/21 02/08/22  Clarnce Flock, MD  ibuprofen (ADVIL) 800 MG tablet Take 1 tablet (800 mg total) by mouth every 8 (eight) hours as needed. Patient not taking: Reported on 03/15/2021 03/07/21   Julianne Handler, CNM  norethindrone (ORTHO MICRONOR) 0.35 MG tablet Take 1 tablet (0.35 mg total) by mouth daily. Patient not taking: Reported on 03/15/2021 02/08/21   Clarnce Flock, MD  Prenatal Vit-Fe Fumarate-FA (MULTIVITAMIN-PRENATAL) 27-0.8 MG TABS tablet Take 1 tablet by mouth daily at 12 noon. Patient not taking: Reported on 03/15/2021 07/16/20   Charlann Lange, PA-C  sulfamethoxazole-trimethoprim (BACTRIM DS) 800-160 MG tablet Take 1 tablet by mouth 2 (two) times daily. 03/07/21   Julianne Handler, CNM     Allergies  Augmentin [amoxicillin-pot clavulanate]   Family History   Family History  Problem Relation Age of Onset   Diabetes Mother        type II   Hyperlipidemia Mother    Hypertension Mother    GER disease Mother    Sleep apnea Mother    Hypertension Father    Hyperlipidemia Maternal Grandmother  Diabetes Maternal Grandmother        Type II   Cancer Maternal Grandfather        Lung cancer   Hypertension Paternal Grandmother    Brain cancer Paternal Grandmother    Lung cancer Paternal Grandmother    Cancer Paternal Grandfather    Diabetes Paternal Grandfather    Hyperlipidemia Paternal Grandfather    Cataracts Paternal Grandfather      Physical Exam  Triage Vital Signs: ED Triage Vitals  Enc Vitals Group     BP 01/28/22 0116 131/67     Pulse Rate 01/28/22 0116 86     Resp 01/28/22 0116 20     Temp 01/28/22 0116 98.7 F (37.1 C)     Temp Source 01/28/22 0116 Oral     SpO2 01/28/22 0116 98 %     Weight  01/28/22 0116 244 lb (110.7 kg)     Height 01/28/22 0116 '5\' 3"'$  (1.6 m)     Head Circumference --      Peak Flow --      Pain Score 01/28/22 0121 9     Pain Loc --      Pain Edu? --      Excl. in [place]? --     Updated Vital Signs: BP 118/75 (BP Location: Right Arm)   Pulse 61   Temp 98.7 F (37.1 C) (Oral)   Resp 20   Ht '5\' 3"'$  (1.6 m)   Wt 110.7 kg   LMP 12/07/2021 (Exact Date)   SpO2 97%   BMI 43.22 kg/m    General: Awake, no distress.  CV:  RRR.  Good peripheral perfusion.  Resp:  Normal effort.  CTA B.  Tender to palpation xiphoid process. Abd:  Nontender to light or deep palpation.  No distention.  Other:  No truncal vesicles.  Calves are nonswollen and nontender.   ED Results / Procedures / Treatments  Labs (all labs ordered are listed, but only abnormal results are displayed) Labs Reviewed  BASIC METABOLIC PANEL - Abnormal; Notable for the following components:      Result Value   Glucose, Bld 114 (*)    Anion gap 3 (*)    All other components within normal limits  CBC - Abnormal; Notable for the following components:   Hemoglobin 11.5 (*)    All other components within normal limits  HEPATIC FUNCTION PANEL - Abnormal; Notable for the following components:   AST 14 (*)    All other components within normal limits  LIPASE, BLOOD  D-DIMER, QUANTITATIVE  POC URINE PREG, ED  TROPONIN I (HIGH SENSITIVITY)  TROPONIN I (HIGH SENSITIVITY)     EKG  ED ECG REPORT I, Mckinzi Eriksen J, the attending physician, personally viewed and interpreted this ECG.   Date: 01/28/2022  EKG Time: 0117  Rate: 84  Rhythm: normal sinus rhythm  Axis: Normal  Intervals:none  ST&T Change: Nonspecific    RADIOLOGY I have independently visualized and interpreted patient's x-ray as well as noted the radiology interpretation:  Chest x-ray: No acute cardiopulmonary process  Official radiology report(s): DG Chest 2 View  Result Date: 01/28/2022 CLINICAL DATA:  Chest pain EXAM:  CHEST - 2 VIEW COMPARISON:  None Available. FINDINGS: The heart size and mediastinal contours are within normal limits. Both lungs are clear. The visualized skeletal structures are unremarkable. IMPRESSION: No active cardiopulmonary disease. Electronically Signed   By: Donavan Foil M.D.   On: 01/28/2022 01:51     PROCEDURES:  Critical Care  performed: No  Procedures   MEDICATIONS ORDERED IN ED: Medications  ketorolac (TORADOL) 30 MG/ML injection 15 mg (0 mg Intravenous Hold 01/28/22 0443)     IMPRESSION / MDM / ASSESSMENT AND PLAN / ED COURSE  I reviewed the triage vital signs and the nursing notes.                             31 year old female presenting with chest pain. Differential diagnosis includes, but is not limited to, ACS, aortic dissection, pulmonary embolism, cardiac tamponade, pneumothorax, pneumonia, pericarditis, myocarditis, GI-related causes including esophagitis/gastritis, and musculoskeletal chest wall pain.   I have personally reviewed patient's records and see that her last office visit was in September 2022 for postpartum visit.  Patient's presentation is most consistent with acute complicated illness / injury requiring diagnostic workup.  Laboratory results demonstrate normal WBC 8.3, normal electrolytes, initial troponin negative.  Chest x-ray is unremarkable.  Will check LFTs/lipase, repeat troponin and D-dimer.  Will administer IV ketorolac for pain and reassess.  Clinical Course as of 01/28/22 0525  Fri Jan 28, 2022  0524 Patient feeling better.  Updated her of negative repeat troponin and D-dimer.  Will discharge home on Naprosyn and patient will follow-up with cardiology.  Strict return precautions given.  Patient verbalizes understanding and agrees with plan of care. [JS]    Clinical Course User Index [JS] Paulette Blanch, MD     FINAL CLINICAL IMPRESSION(S) / ED DIAGNOSES   Final diagnoses:  Nonspecific chest pain     Rx / DC Orders   ED  Discharge Orders     None        Note:  This document was prepared using Dragon voice recognition software and may include unintentional dictation errors.   Paulette Blanch, MD 01/28/22 [phone]

## 2022-01-28 NOTE — Discharge Instructions (Signed)
You may take Naprosyn as needed for chest pain.  Apply moist heat to affected area several times daily.  Return to the ER for worsening symptoms, persistent vomiting, difficulty breathing or other concerns.

## 2022-01-28 NOTE — ED Notes (Signed)
Patient provided with water per request and with MD approval

## 2022-01-31 NOTE — Progress Notes (Unsigned)
Cardiology Office Note:    Date:  02/01/2022   ID:  Susan Hubbard, DOB 1991/03/12, MRN [id number]  PCP:  Merryl Hacker No   [facility] Providers Cardiologist:  Lenna Sciara, MD Referring MD: Paulette Blanch, MD   Chief Complaint/Reason for Referral: Chest pain  ASSESSMENT:    1. Precordial pain   2. BMI 40.0-44.9, adult ([place])   3. Snoring     PLAN:    In order of problems listed above: 1.  Chest pain:  We will obtain a coronary CTA and echocardiogram to evaluate further.  If the patient has mild obstructive coronary artery disease, they will require a statin (with goal LDL < 70) and aspirin, if they have high-grade disease we will need to consider optimal medical therapy and if symptoms are refractory to medical therapy, then a cardiac catheterization with possible PCI will be pursued to alleviate symptoms.  If they have high risk disease we will proceed directly to cardiac catheterization.  We will keep follow-up open-ended depending on the results of these tests. 2.  Elevated BMI: We will refer to pharmacy for recommendations regarding pharmacotherapy. 3.  Snoring: She does complain of daytime somnolence.  Will refer for sleep apnea evaluation.             Dispo:  Return if symptoms worsen or fail to improve.      Medication Adjustments/Labs and Tests Ordered: Current medicines are reviewed at length with the patient today.  Concerns regarding medicines are outlined above.  The following changes have been made:  no change   Labs/tests ordered: Orders Placed This Encounter  Procedures   CT CORONARY MORPH W/CTA COR W/SCORE W/CA W/CM &/OR WO/CM   AMB Referral to [facility]   ECHOCARDIOGRAM COMPLETE   Split night study    Medication Changes: No orders of the defined types were placed in this encounter.    Current medicines are reviewed at length with the patient today.  The patient does not have concerns regarding medicines.   History of Present Illness:     FOCUSED PROBLEM LIST:   1.  No significant medical history 2.  BMI 43  The patient is a 31 y.o. female with the indicated medical history here for chest pain.  The patient was seen in the emergency department recently with complaints of chest pain.  She reported a constant chest pain radiating to her back with rather abrupt onset.  EKG, chest x-ray, and laboratories including cardiac biomarkers and D-dimer were reassuring.  She was discharged home.  The patient tells me that she gets a central chest tightness at times.  It can happen at rest and with exertion.  Happens typically on a daily basis but it has been getting better over the last week or so.  She is not sure whether this is related to stress or not.  She denies any severe bleeding or bruising, presyncope, syncope, or severe edema.  She has had no issues with significant dyspnea however she does get short of breath at times.  She does snore and does report daytime somnolence.  She does not smoke.  She has 2 kids at home.  Her pregnancies were uncomplicated.   Current Medications: No outpatient medications have been marked as taking for the 02/01/22 encounter (Office Visit) with Early Osmond, MD.     Allergies:    Augmentin [amoxicillin-pot clavulanate]   Social History:   Social History   Tobacco Use   Smoking status: Former  Packs/day: 0.25    Years: 8.00    Total pack years: 2.00    Types: Cigarettes   Smokeless tobacco: Never   Tobacco comments:    quit 2019  Vaping Use   Vaping Use: Never used  Substance Use Topics   Alcohol use: No   Drug use: No     Family Hx: Family History  Problem Relation Age of Onset   Diabetes Mother        type II   Hyperlipidemia Mother    Hypertension Mother    GER disease Mother    Sleep apnea Mother    Hypertension Father    Hyperlipidemia Maternal Grandmother    Diabetes Maternal Grandmother        Type II   Cancer Maternal Grandfather        Lung cancer    Hypertension Paternal Grandmother    Brain cancer Paternal Grandmother    Lung cancer Paternal Grandmother    Cancer Paternal Grandfather    Diabetes Paternal Grandfather    Hyperlipidemia Paternal Grandfather    Cataracts Paternal Grandfather      Review of Systems:   Please see the history of present illness.    All other systems reviewed and are negative.     EKGs/Labs/Other Test Reviewed:    EKG:  EKG performed July 2023 that I personally reviewed demonstrates sinus rhythm.  Prior CV studies: None available    Other studies Reviewed: Review of the additional studies/records demonstrates: None relevant  Recent Labs: 01/28/2022: ALT 16; BUN 14; Creatinine, Ser 0.91; Hemoglobin 11.5; Platelets 331; Potassium 3.9; Sodium 139   Recent Lipid Panel Lab Results  Component Value Date/Time   CHOL 162 11/14/2019 09:13 AM   TRIG 85 11/14/2019 09:13 AM   HDL 35 (L) 11/14/2019 09:13 AM   LDLCALC 111 (H) 11/14/2019 09:13 AM    Risk Assessment/Calculations:           Physical Exam:    VS:  BP 100/64   Pulse 67   Ht '5\' 3"'$  (1.6 m)   Wt 244 lb (110.7 kg)   LMP 12/07/2021 (Exact Date)   SpO2 97%   BMI 43.22 kg/m    Wt Readings from Last 3 Encounters:  02/01/22 244 lb (110.7 kg)  01/28/22 244 lb (110.7 kg)  03/23/21 217 lb (98.4 kg)    GENERAL:  No apparent distress, AOx3 HEENT:  No carotid bruits, +2 carotid impulses, no scleral icterus CAR: RRR no murmurs, gallops, rubs, or thrills RES:  Clear to auscultation bilaterally ABD:  Soft, nontender, nondistended, positive bowel sounds x 4 VASC:  +2 radial pulses, +2 carotid pulses, palpable pedal pulses NEURO:  CN 2-12 grossly intact; motor and sensory grossly intact PSYCH:  No active depression or anxiety EXT:  No edema, ecchymosis, or cyanosis  Signed, Early Osmond, MD  02/01/2022 12:30 PM    [address], [place], [place]  [address] Phone: [phone]; Fax: [phone]    Note:  This document was prepared using Dragon voice recognition software and may include unintentional dictation errors.

## 2022-02-01 ENCOUNTER — Encounter: Payer: Self-pay | Admitting: Internal Medicine

## 2022-02-01 ENCOUNTER — Ambulatory Visit (INDEPENDENT_AMBULATORY_CARE_PROVIDER_SITE_OTHER): Payer: Federal, State, Local not specified - PPO | Admitting: Internal Medicine

## 2022-02-01 VITALS — BP 100/64 | HR 67 | Ht 63.0 in | Wt 244.0 lb

## 2022-02-01 DIAGNOSIS — Z6841 Body Mass Index (BMI) 40.0 and over, adult: Secondary | ICD-10-CM | POA: Diagnosis not present

## 2022-02-01 DIAGNOSIS — R0683 Snoring: Secondary | ICD-10-CM

## 2022-02-01 DIAGNOSIS — R072 Precordial pain: Secondary | ICD-10-CM

## 2022-02-01 NOTE — Patient Instructions (Addendum)
Medication Instructions:  No changes *If you need a refill on your cardiac medications before your next appointment, please call your pharmacy*   Lab Work: none If you have labs (blood work) drawn today and your tests are completely normal, you will receive your results only by: [place] (if you have MyChart) OR A paper copy in the mail If you have any lab test that is abnormal or we need to change your treatment, we will call you to review the results.   Testing/Procedures: Your physician has requested that you have an echocardiogram. Echocardiography is a painless test that uses sound waves to create images of your heart. It provides your doctor with information about the size and shape of your heart and how well your heart's chambers and valves are working. This procedure takes approximately one hour. There are no restrictions for this procedure.  Cardiac CTA - see instructions below   Follow-Up: You have been referred to [facility] Pharmacist for elevated BMI    Other Instructions   Your cardiac CT will be scheduled at one [facility], [place] [address] [phone]  Please arrive at the [facility] and Children's Entrance (Entrance C2) of [facility] 30 minutes prior to test start time. You can use the FREE valet parking offered at entrance C (encouraged to control the heart rate for the test)  Proceed to the [facility] Radiology Department (first floor) to check-in and test prep.  All radiology patients and guests should use entrance C2 at [facility], accessed from [facility], even though the hospital's physical address listed is [address]     Please follow these instructions carefully (unless otherwise directed):   On the Night Before the Test: Be sure to Drink plenty of water. Do not consume any caffeinated/decaffeinated beverages or chocolate 12 hours prior to your  test. Do not take any antihistamines 12 hours prior to your test.  On the Day of the Test: Drink plenty of water until 1 hour prior to the test. Do not eat any food 4 hours prior to the test. You may take your regular medications prior to the test.  FEMALES- please wear underwire-free bra if available, avoid dresses & tight clothing  After the Test: Drink plenty of water. After receiving IV contrast, you may experience a mild flushed feeling. This is normal. On occasion, you may experience a mild rash up to 24 hours after the test. This is not dangerous. If this occurs, you can take Benadryl 25 mg and increase your fluid intake. If you experience trouble breathing, this can be serious. If it is severe call 911 IMMEDIATELY. If it is mild, please call our office. If you take any of these medications: Glipizide/Metformin, Avandament, Glucavance, please do not take 48 hours after completing test unless otherwise instructed.  We will call to schedule your test 2-4 weeks out understanding that some insurance companies will need an authorization prior to the service being performed.   For non-scheduling related questions, please contact the cardiac imaging nurse navigator should you have any questions/concerns: Marchia Bond, Cardiac Imaging Nurse Navigator Gordy Clement, Cardiac Imaging Nurse Navigator [facility] Direct Office Dial: [phone]   For scheduling needs, including cancellations and rescheduling, please call [place], [phone].  Important Information About Sugar

## 2022-02-18 ENCOUNTER — Telehealth (HOSPITAL_COMMUNITY): Payer: Self-pay | Admitting: *Deleted

## 2022-02-18 NOTE — Telephone Encounter (Signed)
Attempted to call patient regarding upcoming cardiac CT appointment. °Left message on voicemail with name and callback number ° °Shenee Wignall RN Navigator Cardiac Imaging °[facility] °[phone] Office °[phone] Cell ° °

## 2022-02-20 ENCOUNTER — Telehealth (HOSPITAL_COMMUNITY): Payer: Self-pay | Admitting: Emergency Medicine

## 2022-02-20 NOTE — Telephone Encounter (Signed)
Calling  to inform patient that we are having to cancel her CCTA appt tomorrow due to reading software glitch.  Her new appt is 8/24 at 2:00pm.  Marchia Bond RN Navigator Cardiac [facility] [phone] Office  [phone] Cell

## 2022-02-20 NOTE — Telephone Encounter (Signed)
Left message to inform patient that software for reading CTs is down and we are unable to perform her CCTA until a solution is found. Requested that she call back to r/s her appt.   Marchia Bond RN Navigator Cardiac Imaging [facility] [phone] Office  [phone] Cell

## 2022-02-21 ENCOUNTER — Ambulatory Visit (HOSPITAL_COMMUNITY): Payer: Federal, State, Local not specified - PPO | Attending: Cardiovascular Disease

## 2022-02-21 ENCOUNTER — Ambulatory Visit (HOSPITAL_COMMUNITY): Admission: RE | Admit: 2022-02-21 | Payer: Federal, State, Local not specified - PPO | Source: Ambulatory Visit

## 2022-02-21 ENCOUNTER — Ambulatory Visit (INDEPENDENT_AMBULATORY_CARE_PROVIDER_SITE_OTHER): Payer: Federal, State, Local not specified - PPO | Admitting: Pharmacist

## 2022-02-21 VITALS — Ht 63.0 in | Wt 248.5 lb

## 2022-02-21 DIAGNOSIS — R072 Precordial pain: Secondary | ICD-10-CM | POA: Diagnosis not present

## 2022-02-21 DIAGNOSIS — Z6841 Body Mass Index (BMI) 40.0 and over, adult: Secondary | ICD-10-CM

## 2022-02-21 DIAGNOSIS — R0683 Snoring: Secondary | ICD-10-CM | POA: Diagnosis not present

## 2022-02-21 LAB — ECHOCARDIOGRAM COMPLETE
Area-P 1/2: 4.06 cm2
S' Lateral: 2.8 cm

## 2022-02-21 MED ORDER — SAXENDA 18 MG/3ML ~~LOC~~ SOPN: PEN_INJECTOR | SUBCUTANEOUS | 0 refills | Status: DC

## 2022-02-21 NOTE — Patient Instructions (Addendum)
Will submit PA for Saxenda  GLP1 Agonist Titration Plan:  Will plan to follow the titration plan as below, pending patient is tolerating each dose before increasing to the next. Can slow titration if needed for tolerability.   Take Saxenda every day, clean before administration with alcohol swabs, and rotate injection sites.  Work on a diet and exercise (150 minutes per week and mediterranean diet)   Saxenda to [employer]:  -Week 1: Saxenda 0.6 mg daily x 7 days -Week 2: Saxenda 1.2 mg daily x 7 days -Week 3: Saxenda 1.8 mg daily x 7 days -Week 4: Saxenda 2.4 mg daily x 7 days -Week 5 & 6: Saxenda 3 mg daily x 14 days -Week 7-10: Stop Saxenda. Start Wegovy 1.7 mg weekly x 4 weeks -Week 11 & on: Wegovy 2.4 mg weekly

## 2022-02-21 NOTE — Progress Notes (Signed)
Patient ID: [name]                 DOB: 02/11/1991                    MRN: [id number]     HPI: Susan Hubbard is a 31 y.o. female patient referred to pharmacy clinic by Dr. Ali Lowe to initiate weight loss therapy with GLP1-RA. PMH is significant for obesity, PCOS, HLD, and gestational diabetes. Pt had an echo today and has a CTA scheduled later this month. BMI today is 44. At last visit with Dr. Ali Lowe where she was seen for precordial pain, she reported obesity is negatively affecting her health, including snoring with daytime somnolance, for which she was referred to be evaluated for sleep apnea.   Today she reports in good spirits motivated to make lifestyle changes to improve her weight and overall health. Pt denies any personal or family history of medullary thyroid cancer. Pt has attempted few lifestyle modifications, but reports difficulty leaving behind an overall unhealthy lifestyle because of its convenience, however she is willing to double down on lifestyle modifications to benefit her personal wellbeing.   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  Family History:  Family History  Problem Relation Age of Onset   Diabetes Mother        type II   Hyperlipidemia Mother    Hypertension Mother    GER disease Mother    Sleep apnea Mother    Hypertension Father    Hyperlipidemia Maternal Grandmother    Diabetes Maternal Grandmother        Type II   Cancer Maternal Grandfather        Lung cancer   Hypertension Paternal Grandmother    Brain cancer Paternal Grandmother    Lung cancer  Paternal Grandmother    Cancer Paternal Grandfather    Diabetes Paternal Grandfather    Hyperlipidemia Paternal Grandfather    Cataracts Paternal Grandfather     Social History:  Social History   Socioeconomic History   Marital status: Significant Other    Spouse name: Not on file   Number of children: Not on file   Years of education: Not on file   Highest education level: Not on file  Occupational History   Not on file  Tobacco Use   Smoking status: Former    Packs/day: 0.25    Years: 8.00    Total pack years: 2.00    Types: Cigarettes   Smokeless tobacco: Never   Tobacco comments:    quit 2019  Vaping Use   Vaping Use: Never used  Substance and Sexual Activity   Alcohol use: No   Drug use: No   Sexual activity: Yes    Birth control/protection: None  Other Topics Concern   Not on file  Social History Narrative   Not on file   Social Determinants of Health   Financial Resource Strain: Not on file  Food Insecurity: No Food Insecurity (12/02/2020)   Hunger Vital Sign    Worried About Running Out of Food in the Last Year: Never true    Ran  Out of Food in the Last Year: Never true  Transportation Needs: Not on file  Physical Activity: Not on file  Stress: Not on file  Social Connections: Not on file    Labs: Lab Results  Component Value Date   HGBA1C 5.3 08/05/2020    Wt Readings from Last 1 Encounters:  02/01/22 244 lb (110.7 kg)    BP Readings from Last 1 Encounters:  02/01/22 100/64   Pulse Readings from Last 1 Encounters:  02/01/22 67       Component Value Date/Time   CHOL 162 11/14/2019 0913   TRIG 85 11/14/2019 0913   HDL 35 (L) 11/14/2019 0913   CHOLHDL 4.6 (H) 11/14/2019 0913   LDLCALC 111 (H) 11/14/2019 0913    Past Medical History:  Diagnosis Date   Anemia    Bacterial vaginosis 11/11/2015   History of   Chlamydia    Chronic kidney disease    h/o kidney stones   Family history of adverse reaction to anesthesia    mom unsure of  what   Gestational diabetes    Hyperlipidemia    Irregular menses    Oligomenorrhea    PCOS (polycystic ovarian syndrome)    PCOS (polycystic ovarian syndrome)    Vaccine for human papilloma virus (HPV) types 6, 11, 16, and 18 administered    Vitamin D deficiency    Vitamin D deficiency     No current outpatient medications on file prior to visit.   No current facility-administered medications on file prior to visit.    Allergies  Allergen Reactions   Augmentin [Amoxicillin-Pot Clavulanate] Nausea And Vomiting    Has patient had a PCN reaction causing immediate rash, facial/tongue/throat swelling, SOB or lightheadedness with hypotension: no Has patient had a PCN reaction causing severe rash involving mucus membranes or skin necrosis: unknown Has patient had a PCN reaction that required hospitalization no Has patient had a PCN reaction occurring within the last 10 years: yes If all of the above answers are "NO", then may proceed with Cephalosporin use.      Assessment/Plan:  1. Weight loss - Patient has not met goal of at least 5% of body weight loss with comprehensive lifestyle modifications alone in the past 3-6 months. Pharmacotherapy is appropriate to pursue as augmentation. Pt was educated on proper diet and exercise including portion control, the plate method, and overview of the mediterranean diet. Pt is willing to decrease the amount of sugary beverages and fast food consumed daily. Spoke to her about meal prepping one day per week so that is becomes easier to bring a healthy lunch every day as opposed to going to get fast food. Pt reports her current exercise is limited to her time working at the post office and playing with her 33 year old child. She was encouraged to aim for 150 minutes of moderate intensity exercise per week and that any increase in exercise is a step in the right direction. The patient was counseled on proper administration, storage, and adverse effects of  Saxenda and Wegovy. Will start the following regimen with each dose change dependent on tolerability of the former:  [place] to [employer]:  -Week 1: Saxenda 0.6 mg daily x 7 days -Week 2: Saxenda 1.2 mg daily x 7 days -Week 3: Saxenda 1.8 mg daily x 7 days -Week 4: Saxenda 2.4 mg daily x 7 days -Week 5 & 6: Saxenda 3 mg daily x 14 days -Week 7-10: Stop Saxenda. Start Wegovy 1.7 mg weekly  x 4 weeks -Week 11 & on: Wegovy 2.4 mg weekly   Saxenda PA was approved and [facility] to have a copay of $0. Pt is aware and will pick up medications when it is ready.  Follow up via telephone in 3 weeks and in clinic at 3 months and 6 months after initiating therapy.  Thank you for allowing pharmacy to participate in this patient's care.  Reatha Harps, PharmD PGY2 Pharmacy Resident 02/21/2022 3:32 PM Check [email or web address] for unit specific pharmacy number

## 2022-03-03 DIAGNOSIS — Z6841 Body Mass Index (BMI) 40.0 and over, adult: Secondary | ICD-10-CM | POA: Diagnosis not present

## 2022-03-03 DIAGNOSIS — G5601 Carpal tunnel syndrome, right upper limb: Secondary | ICD-10-CM | POA: Diagnosis not present

## 2022-03-03 DIAGNOSIS — G5602 Carpal tunnel syndrome, left upper limb: Secondary | ICD-10-CM | POA: Diagnosis not present

## 2022-03-09 ENCOUNTER — Telehealth (HOSPITAL_COMMUNITY): Payer: Self-pay | Admitting: Emergency Medicine

## 2022-03-09 NOTE — Telephone Encounter (Signed)
Attempted to call patient regarding upcoming cardiac CT appointment. °Left message on voicemail with name and callback number °Virgie Chery RN Navigator Cardiac Imaging °[facility] °[phone] Office °[phone] Cell ° °

## 2022-03-10 ENCOUNTER — Ambulatory Visit (HOSPITAL_COMMUNITY): Admission: RE | Admit: 2022-03-10 | Payer: Federal, State, Local not specified - PPO | Source: Ambulatory Visit

## 2022-03-10 ENCOUNTER — Encounter (HOSPITAL_COMMUNITY): Payer: Self-pay

## 2022-03-14 ENCOUNTER — Telehealth: Payer: Self-pay | Admitting: Pharmacist

## 2022-03-14 NOTE — Telephone Encounter (Signed)
Called to discuss progress on Saxenda, however pt reports not being able to pick up Darke because [facility] is [facility]. Advised [facility] and have [facility] search surrounding stores. She will call the clinic when she is able to start Saxenda. Will check in again after pt has received the medication

## 2022-04-03 ENCOUNTER — Other Ambulatory Visit: Payer: Self-pay

## 2022-04-03 ENCOUNTER — Encounter: Payer: Self-pay | Admitting: Emergency Medicine

## 2022-04-03 ENCOUNTER — Observation Stay
Admission: EM | Admit: 2022-04-03 | Discharge: 2022-04-04 | Disposition: A | Discharge status: Home / Self Care | Payer: Federal, State, Local not specified - PPO | Attending: Obstetrics and Gynecology | Admitting: Obstetrics and Gynecology

## 2022-04-03 ENCOUNTER — Emergency Department: Payer: Federal, State, Local not specified - PPO

## 2022-04-03 DIAGNOSIS — Z3A01 Less than 8 weeks gestation of pregnancy: Secondary | ICD-10-CM | POA: Diagnosis not present

## 2022-04-03 DIAGNOSIS — O039 Complete or unspecified spontaneous abortion without complication: Secondary | ICD-10-CM

## 2022-04-03 DIAGNOSIS — D500 Iron deficiency anemia secondary to blood loss (chronic): Secondary | ICD-10-CM | POA: Insufficient documentation

## 2022-04-03 DIAGNOSIS — Z87891 Personal history of nicotine dependence: Secondary | ICD-10-CM | POA: Diagnosis not present

## 2022-04-03 DIAGNOSIS — O034 Incomplete spontaneous abortion without complication: Principal | ICD-10-CM | POA: Diagnosis present

## 2022-04-03 DIAGNOSIS — Z98891 History of uterine scar from previous surgery: Secondary | ICD-10-CM | POA: Diagnosis not present

## 2022-04-03 DIAGNOSIS — N939 Abnormal uterine and vaginal bleeding, unspecified: Secondary | ICD-10-CM | POA: Diagnosis not present

## 2022-04-03 DIAGNOSIS — O26891 Other specified pregnancy related conditions, first trimester: Secondary | ICD-10-CM | POA: Diagnosis not present

## 2022-04-03 DIAGNOSIS — N189 Chronic kidney disease, unspecified: Secondary | ICD-10-CM | POA: Insufficient documentation

## 2022-04-03 DIAGNOSIS — O4691 Antepartum hemorrhage, unspecified, first trimester: Secondary | ICD-10-CM | POA: Diagnosis not present

## 2022-04-03 DIAGNOSIS — O209 Hemorrhage in early pregnancy, unspecified: Secondary | ICD-10-CM | POA: Diagnosis not present

## 2022-04-03 DIAGNOSIS — D649 Anemia, unspecified: Secondary | ICD-10-CM | POA: Diagnosis not present

## 2022-04-03 DIAGNOSIS — O99011 Anemia complicating pregnancy, first trimester: Secondary | ICD-10-CM | POA: Diagnosis not present

## 2022-04-03 LAB — COMPREHENSIVE METABOLIC PANEL
ALT: 17 U/L (ref 0–44)
AST: 25 U/L (ref 15–41)
Albumin: 3.7 g/dL (ref 3.5–5.0)
Alkaline Phosphatase: 68 U/L (ref 38–126)
Anion gap: 8 (ref 5–15)
BUN: 11 mg/dL (ref 6–20)
CO2: 23 mmol/L (ref 22–32)
Calcium: 8.4 mg/dL — ABNORMAL LOW (ref 8.9–10.3)
Chloride: 104 mmol/L (ref 98–111)
Creatinine, Ser: 0.84 mg/dL (ref 0.44–1.00)
GFR, Estimated: >60 mL/min (ref >60)
Glucose, Bld: 96 mg/dL (ref 70–99)
Potassium: 4.2 mmol/L (ref 3.5–5.1)
Sodium: 135 mmol/L (ref 135–145)
Total Bilirubin: 1.1 mg/dL (ref 0.3–1.2)
Total Protein: 7.3 g/dL (ref 6.5–8.1)

## 2022-04-03 LAB — CBC
HCT: 33.7 % — ABNORMAL LOW (ref 36.0–46.0)
Hemoglobin: 10.8 g/dL — ABNORMAL LOW (ref 12.0–15.0)
MCH: 27.3 pg (ref 26.0–34.0)
MCHC: 32 g/dL (ref 30.0–36.0)
MCV: 85.1 fL (ref 80.0–100.0)
Platelets: 333 10*3/uL (ref 150–400)
RBC: 3.96 MIL/uL (ref 3.87–5.11)
RDW: 13.7 % (ref 11.5–15.5)
WBC: 13 10*3/uL — ABNORMAL HIGH (ref 4.0–10.5)
nRBC: 0 % (ref 0.0–0.2)

## 2022-04-03 LAB — CBC WITH DIFFERENTIAL/PLATELET
Abs Immature Granulocytes: 0.04 10*3/uL (ref 0.00–0.07)
Basophils Absolute: 0 10*3/uL (ref 0.0–0.1)
Basophils Relative: 0 %
Eosinophils Absolute: 0.2 10*3/uL (ref 0.0–0.5)
Eosinophils Relative: 2 %
HCT: 39.5 % (ref 36.0–46.0)
Hemoglobin: 12.6 g/dL (ref 12.0–15.0)
Immature Granulocytes: 0 %
Lymphocytes Relative: 21 %
Lymphs Abs: 2.4 10*3/uL (ref 0.7–4.0)
MCH: 26.8 pg (ref 26.0–34.0)
MCHC: 31.9 g/dL (ref 30.0–36.0)
MCV: 84 fL (ref 80.0–100.0)
Monocytes Absolute: 0.5 10*3/uL (ref 0.1–1.0)
Monocytes Relative: 5 %
Neutro Abs: 8.1 10*3/uL — ABNORMAL HIGH (ref 1.7–7.7)
Neutrophils Relative %: 72 %
Platelets: 393 10*3/uL (ref 150–400)
RBC: 4.7 MIL/uL (ref 3.87–5.11)
RDW: 13.8 % (ref 11.5–15.5)
WBC: 11.2 10*3/uL — ABNORMAL HIGH (ref 4.0–10.5)
nRBC: 0 % (ref 0.0–0.2)

## 2022-04-03 LAB — LIPASE, BLOOD: Lipase: 26 U/L (ref 11–51)

## 2022-04-03 LAB — HCG, QUANTITATIVE, PREGNANCY: hCG, Beta Chain, Quant, S: 699 m[IU]/mL — ABNORMAL HIGH (ref <5)

## 2022-04-03 MED ORDER — IBUPROFEN 600 MG PO TABS: 600.0000 mg | ORAL_TABLET | Freq: Four times a day (QID) | ORAL | Status: DC | PRN

## 2022-04-03 MED ORDER — MORPHINE SULFATE (PF) 4 MG/ML IV SOLN
INTRAVENOUS | Status: AC
  Filled 2022-04-03: qty 1

## 2022-04-03 MED ORDER — MISOPROSTOL 200 MCG PO TABS
800.0000 ug | ORAL_TABLET | ORAL | Status: DC
  Administered 2022-04-03: 800 ug via VAGINAL
  Filled 2022-04-03 (×2): qty 4

## 2022-04-03 MED ORDER — KETOROLAC TROMETHAMINE 30 MG/ML IJ SOLN
15.0000 mg | Freq: Four times a day (QID) | INTRAMUSCULAR | Status: DC
  Filled 2022-04-03 (×2): qty 1

## 2022-04-03 MED ORDER — MAGNESIUM HYDROXIDE 400 MG/5ML PO SUSP: 30.0000 mL | Freq: Every day | ORAL | Status: DC | PRN

## 2022-04-03 MED ORDER — SODIUM CHLORIDE 0.9 % IV SOLN: 10.0000 mL/h | Freq: Once | INTRAVENOUS | Status: DC

## 2022-04-03 MED ORDER — MORPHINE SULFATE (PF) 4 MG/ML IV SOLN
4.0000 mg | Freq: Once | INTRAVENOUS | Status: DC
  Filled 2022-04-03: qty 1

## 2022-04-03 MED ORDER — OXYCODONE-ACETAMINOPHEN 5-325 MG PO TABS
1.0000 | ORAL_TABLET | ORAL | Status: DC | PRN
  Administered 2022-04-03 – 2022-04-04 (×2): 2 via ORAL
  Filled 2022-04-03 (×2): qty 2

## 2022-04-03 MED ORDER — MAGNESIUM CITRATE PO SOLN: 1.0000 | Freq: Once | ORAL | Status: DC | PRN

## 2022-04-03 MED ORDER — KETOROLAC TROMETHAMINE 15 MG/ML IJ SOLN
15.0000 mg | Freq: Once | INTRAMUSCULAR | Status: AC
  Administered 2022-04-03: 15 mg via INTRAVENOUS
  Filled 2022-04-03: qty 1

## 2022-04-03 MED ORDER — SODIUM CHLORIDE 0.9 % IV BOLUS: 1000.0000 mL | Freq: Once | INTRAVENOUS | Status: DC

## 2022-04-03 MED ORDER — LACTATED RINGERS IV SOLN: INTRAVENOUS | Status: DC

## 2022-04-03 MED ORDER — DOCUSATE SODIUM 100 MG PO CAPS
100.0000 mg | ORAL_CAPSULE | Freq: Two times a day (BID) | ORAL | Status: DC
  Administered 2022-04-03 – 2022-04-04 (×2): 100 mg via ORAL
  Filled 2022-04-03 (×3): qty 1

## 2022-04-03 MED ORDER — ONDANSETRON HCL 4 MG PO TABS: 4.0000 mg | ORAL_TABLET | Freq: Four times a day (QID) | ORAL | Status: DC | PRN

## 2022-04-03 MED ORDER — SODIUM CHLORIDE 0.9 % IV BOLUS
1000.0000 mL | Freq: Once | INTRAVENOUS | Status: AC
  Administered 2022-04-03: 1000 mL via INTRAVENOUS

## 2022-04-03 MED ORDER — ALUM & MAG HYDROXIDE-SIMETH 200-200-20 MG/5ML PO SUSP: 30.0000 mL | ORAL | Status: DC | PRN

## 2022-04-03 MED ORDER — HYDROMORPHONE HCL 1 MG/ML IJ SOLN: 0.2000 mg | INTRAMUSCULAR | Status: DC | PRN

## 2022-04-03 MED ORDER — ACETAMINOPHEN 325 MG PO TABS
650.0000 mg | ORAL_TABLET | Freq: Once | ORAL | Status: AC
  Administered 2022-04-03: 650 mg via ORAL
  Filled 2022-04-03: qty 2

## 2022-04-03 MED ORDER — ONDANSETRON HCL 4 MG/2ML IJ SOLN: 4.0000 mg | Freq: Four times a day (QID) | INTRAMUSCULAR | Status: DC | PRN

## 2022-04-03 MED ORDER — ZOLPIDEM TARTRATE 5 MG PO TABS: 5.0000 mg | ORAL_TABLET | Freq: Every evening | ORAL | Status: DC | PRN

## 2022-04-03 MED ORDER — BISACODYL 5 MG PO TBEC: 5.0000 mg | DELAYED_RELEASE_TABLET | Freq: Every day | ORAL | Status: DC | PRN

## 2022-04-03 NOTE — ED Notes (Signed)
RN changed pt prior to transport.

## 2022-04-03 NOTE — ED Triage Notes (Addendum)
Pt via POV with threatened miscarriage. She thinks she is about 3 months pregnant. Pt reports painful abdominal cramping with heavy bleeding and passing clots but does not think she has passed POC. Pt is acutely anxious and appears uncomfortable in triage. Pt states she has not taken a pregnancy test but her LMP was in March of this year. Pt is bleeding heavily during triage assessment.

## 2022-04-03 NOTE — Plan of Care (Signed)
Transferred to Room 345. Alert and oriented with quiet affect. Color good, skin w&d. Small vaginal bleeding with small clot noted. Peri Care provided.  Otherwise, assessment Intact. Will medicate with ordered Percocet for Abdominal cramping rated at an 8. Oriented to Room, Instructed not to get up without assist due to Orthostatic Hypotension ain E.R. and Pt. V/O. Lynett Fish CNM is coming in to see Pt.

## 2022-04-03 NOTE — ED Notes (Signed)
EDP notified of pt's orthstatic VS

## 2022-04-03 NOTE — ED Provider Notes (Signed)
[facility] Provider Note    Event Date/Time   First MD Initiated Contact with Patient 04/03/22 1339     (approximate)   History   Threatened Miscarriage   HPI  Susan Hubbard is a 31 y.o. female G5 P2 spontaneous abortion, with a past medical history of gestational diabetes, hypercholesterolemia, PCOS who presents today with vaginal bleeding.  Patient thinks that she is about 3 months pregnant.  She denies having had a pregnancy test, but reports that her LMP was in March.  She reports that last night she began to have a small amount of spotting so she thought she was about to have a normal period,  but then today her bleeding became much heavier.  She developed abdominal cramping.  She reports that her pain is across her low abdomen and into her back.  Patient was very anxious in triage, saying that she was passing clots and was brought back to the room where she passed a large clot which we have collected.  She reports that she continues to have lower abdominal cramping and vaginal bleeding.  Patient Active Problem List   Diagnosis Date Noted   Incomplete spontaneous abortion 04/03/2022   OB Hemorrhage 02/08/2021   History of cesarean section 02/07/2021   History of gestational diabetes 12/02/2020   Obesity, unspecified 02/05/2015   Pure hypercholesterolemia 02/05/2015   Kidney stones 02/05/2015   Sleep disturbance 02/05/2015   Acne 02/05/2015   Eczema 02/05/2015   History of PCOS 02/05/2015          Physical Exam   Triage Vital Signs: ED Triage Vitals  Enc Vitals Group     BP 04/03/22 1334 (!) 144/101     Pulse Rate 04/03/22 1334 (!) 113     Resp 04/03/22 1334 18     Temp 04/03/22 1334 98.4 F (36.9 C)     Temp Source 04/03/22 1334 Oral     SpO2 04/03/22 1334 95 %     Weight 04/03/22 1335 245 lb (111.1 kg)     Height 04/03/22 1335 '5\' 3"'$  (1.6 m)     Head Circumference --      Peak Flow --      Pain Score 04/03/22 1335 10     Pain Loc  --      Pain Edu? --      Excl. in [place]? --     Most recent vital signs: Vitals:   04/03/22 1936 04/03/22 2013  BP: 109/65 113/72  Pulse: 97 (!) 111  Resp: 16 20  Temp: 98.2 F (36.8 C) 98.5 F (36.9 C)  SpO2: 100% 100%    Physical Exam Vitals and nursing note reviewed.  Constitutional:      General: Awake and alert. No acute distress.    Appearance: Normal appearance. The patient is overweight.  HENT:     Head: Normocephalic and atraumatic.     Mouth: Mucous membranes are moist.  Eyes:     General: PERRL. Normal EOMs        Right eye: No discharge.        Left eye: No discharge.     Conjunctiva/sclera: Conjunctivae normal.  Cardiovascular:     Rate and Rhythm: Tachycardic rate and regular rhythm.     Pulses: Normal pulses.  Pulmonary:     Effort: Pulmonary effort is normal. No respiratory distress.     Breath sounds: Normal breath sounds.  Abdominal:     Abdomen is soft. There is  suprapubic lower abdominal tenderness. No rebound or guarding. No distention.  Large amount of blood between her legs with large clots and tissue Musculoskeletal:        General: No swelling. Normal range of motion.     Cervical back: Normal range of motion and neck supple.  Skin:    General: Skin is warm and dry.     Capillary Refill: Capillary refill takes less than 2 seconds.     Findings: No rash.  Neurological:     Mental Status: The patient is awake and alert.      ED Results / Procedures / Treatments   Labs (all labs ordered are listed, but only abnormal results are displayed) Labs Reviewed  CBC WITH DIFFERENTIAL/PLATELET - Abnormal; Notable for the following components:      Result Value   WBC 11.2 (*)    Neutro Abs 8.1 (*)    All other components within normal limits  COMPREHENSIVE METABOLIC PANEL - Abnormal; Notable for the following components:   Calcium 8.4 (*)    All other components within normal limits  HCG, QUANTITATIVE, PREGNANCY - Abnormal; Notable for the  following components:   hCG, Beta Chain, Quant, S 699 (*)    All other components within normal limits  CBC - Abnormal; Notable for the following components:   WBC 13.0 (*)    Hemoglobin 10.8 (*)    HCT 33.7 (*)    All other components within normal limits  LIPASE, BLOOD  URINALYSIS, ROUTINE W REFLEX MICROSCOPIC  POC URINE PREG, ED  TYPE AND SCREEN  PREPARE RBC (CROSSMATCH)     EKG     RADIOLOGY I independently reviewed and interpreted imaging and agree with radiologists findings.     PROCEDURES:  Critical Care performed:   Procedures   MEDICATIONS ORDERED IN ED: Medications  misoprostol (CYTOTEC) tablet 800 mcg (has no administration in time range)  lactated ringers infusion (has no administration in time range)  ibuprofen (ADVIL) tablet 600 mg (has no administration in time range)  oxyCODONE-acetaminophen (PERCOCET/ROXICET) 5-325 MG per tablet 1-2 tablet (has no administration in time range)  HYDROmorphone (DILAUDID) injection 0.2-0.6 mg (has no administration in time range)  zolpidem (AMBIEN) tablet 5 mg (has no administration in time range)  docusate sodium (COLACE) capsule 100 mg (has no administration in time range)  magnesium hydroxide (MILK OF MAGNESIA) suspension 30 mL (has no administration in time range)  bisacodyl (DULCOLAX) EC tablet 5 mg (has no administration in time range)  magnesium citrate solution 1 Bottle (has no administration in time range)  alum & mag hydroxide-simeth (MAALOX/MYLANTA) 200-200-20 MG/5ML suspension 30 mL (has no administration in time range)  ondansetron (ZOFRAN) tablet 4 mg (has no administration in time range)    Or  ondansetron (ZOFRAN) injection 4 mg (has no administration in time range)  0.9 %  sodium chloride infusion (has no administration in time range)  sodium chloride 0.9 % bolus 1,000 mL (has no administration in time range)  acetaminophen (TYLENOL) tablet 650 mg (650 mg Oral Given 04/03/22 1408)  sodium chloride 0.9  % bolus 1,000 mL (0 mLs Intravenous Stopped 04/03/22 1741)  ketorolac (TORADOL) 15 MG/ML injection 15 mg (15 mg Intravenous Given 04/03/22 1741)     IMPRESSION / MDM / ASSESSMENT AND PLAN / ED COURSE  I reviewed the triage vital signs and the nursing notes.  Differential diagnosis includes, but is not limited to, spontaneous abortion, retained products of conception, menorrhagia, fibroids, ectopic pregnancy.  Patient is  tachycardic on arrival to 113, though normotensive.  She passed 3 large clots when she came to the room.  Patient is awake and alert, tachycardic on arrival, though this returned to normal without intervention.  She continues to pass large clots, it appears that she is spontaneously miscarrying.  Rh+, does not require RhoGAM.  Her H&H is stable.  Ultrasound was obtained to evaluate for retained products of conception.  However, patient was unable to stand to go to the stretcher for the ultrasound because she had extreme lightheadedness with standing up and immediately sat back down.  The ultrasound was subsequently performed in her ER room.   I received a phone call from the radiologist saying that there is intrauterine fetal demise.  I discussed initially with Gigi Gin and subsequently per Stephanie Coup recommendation directly with Dr. Leafy Ro regarding whether patient should receive misoprostol versus D&C versus let her miscarry spontaneously as she appears to be doing.  Dr. Leafy Ro felt that any of these options were appropriate, and patient preferred to trial miso.  Dr. Leafy Ro recommends 800 mcg of intravaginal miso.   Patient continues to feel lightheaded and weak.  Her repeat CBC demonstrates an H&H drop from 39/12 to 33/10.8.  Therefore I asked the tech to perform orthostatic vital signs, and patient dropped her pressures to 58/33 with standing and had to sit back down immediately.  I again discussed this with Dr. Leafy Ro who recommends transfusing 1 unit of PRBCs and  agrees for overnight observation.  She has agreed to admit the patient.  Patient also and is in agreement with admission and red blood cell transfusion.  We discussed the risks and benefits of red blood cell transfusion and she would like to proceed with this.   Patient's presentation is most consistent with acute presentation with potential threat to life or bodily function.   Clinical Course as of 04/03/22 2017  Nancy Fetter Apr 03, 2022  1740 Discussed first with Gigi Gin who advised that I speak directly with Dr. Leafy Ro.  Dr. Leafy Ro reports that D&C, misoprostol, or doing nothing are all viable options for the patient.  Patient would prefer to do miso.  However, given that patient dropped her pressures into the 90s, Dr. Leafy Ro feels it is worthwhile to keep her for observation overnight. Patient is in agreement with this plan [JP]  1809 Orthostatic vital signs performed by tech.  Patient dropped pressure to 58/33 with standing with heart rate increased to 114.  Discussed with OB/GYN. Midwife coming down [JP]  1849 Dr. Leafy Ro recommends transfusing 1 unit of PRBCs.  Patient is in agreement with this [JP]    Clinical Course User Index [JP] Shadiamond Koska, Clarnce Flock, PA-C     FINAL CLINICAL IMPRESSION(S) / ED DIAGNOSES   Final diagnoses:  Spontaneous abortion  Vaginal bleeding  Symptomatic anemia     Rx / DC Orders   ED Discharge Orders     None        Note:  This document was prepared using Dragon voice recognition software and may include unintentional dictation errors.   Emeline Gins 04/03/22 2017    Lucillie Garfinkel, MD 04/05/22 [phone]

## 2022-04-03 NOTE — ED Notes (Signed)
Pt passing large blood clots, this RN changed one pad soaked with blood. Pt taken to [place] but unable to tolerate sitting in wheelchair, pt returned to room, placed back in bed, feet elevated.

## 2022-04-03 NOTE — ED Notes (Signed)
Terri RN notified that blood bank notified this RN that pt's blood is ready.

## 2022-04-03 NOTE — H&P (Signed)
Consult History and Physical   SERVICE: Gynecology  Patient Name: Susan Hubbard Patient MRN:   [id number]  CC: Missed abortion  HPI: Susan Hubbard is a 31 y.o. [phone] with active spontaneous abortion at approx 46w5dby ultrasound today, presenting with cramping and heavy bleeding to the ER.  She had one episode of syncope during ultrasound and +orthostatic vital changes.   She filled 3 basins with blood in the ER and continues with lighter blood flow. She was also  cramping quite a bit in the ED, but reports this has lessened since her arrival to the Mother Baby unit. Review of Systems  Constitutional: Negative.   HENT: Negative.    Eyes: Negative.   Respiratory: Negative.    Cardiovascular: Negative.   Gastrointestinal: Negative.   Genitourinary: Negative.   Musculoskeletal: Negative.   Skin: Negative.   Neurological:  Positive for dizziness.       Has had several dizzy spells  during her admission to the ED.  Endo/Heme/Allergies: Negative.   Psychiatric/Behavioral: Negative.       Past Obstetrical History: OB History     Gravida  5   Para  1   Term  1   Preterm      AB  3   Living  1      SAB  1   IAB  2   Ectopic      Multiple      Live Births  1           Past Gynecologic History: Patient's last menstrual period was 09/24/2021 (approximate). Menstrual frequency Q few months since this Spring, lasting 6 days requiring usually 5pads/day on the first two days. Her periods formerly were irregular. She has a HX of PCOS, but reports that last year, after her repeat C section, her cycles were monthly until this Spring, when she recorded an LMP in March. She has not been using contraception since her CS last year.  Past Medical History: Past Medical History:  Diagnosis Date   Anemia    Bacterial vaginosis 11/11/2015   History of   Chlamydia    Chronic kidney disease    h/o kidney stones   Family history of adverse reaction to anesthesia     mom unsure of what   Gestational diabetes    Hyperlipidemia    Irregular menses    Oligomenorrhea    PCOS (polycystic ovarian syndrome)    PCOS (polycystic ovarian syndrome)    Vaccine for human papilloma virus (HPV) types 6, 11, 16, and 18 administered    Vitamin D deficiency    Vitamin D deficiency     Past Surgical History:   Past Surgical History:  Procedure Laterality Date   CESAREAN SECTION     CESAREAN SECTION N/A 02/06/2021   Procedure: CESAREAN SECTION;  Surgeon: EChancy Milroy MD;  Location: [facility] ORS;  Service: Obstetrics;  Laterality: N/A;   HYSTEROSCOPY WITH D & C N/A 10/20/2015   Procedure: DILATATION AND CURETTAGE /HYSTEROSCOPY;  Surgeon: AMalachy Mood MD;  Location: [facility] ORS;  Service: Gynecology;  Laterality: N/A;    Family History:  family history includes Brain cancer in her paternal grandmother; Cancer in her maternal grandfather and paternal grandfather; Cataracts in her paternal grandfather; Diabetes in her maternal grandmother, mother, and paternal grandfather; GER disease in her mother; Hyperlipidemia in her maternal grandmother, mother, and paternal grandfather; Hypertension in her father, mother, and paternal grandmother; Lung cancer in her paternal grandmother; Sleep apnea  in her mother.  Social History:  Social History   Socioeconomic History   Marital status: Significant Other    Spouse name: Not on file   Number of children: Not on file   Years of education: Not on file   Highest education level: Not on file  Occupational History   Not on file  Tobacco Use   Smoking status: Former    Packs/day: 0.25    Years: 8.00    Total pack years: 2.00    Types: Cigarettes   Smokeless tobacco: Never   Tobacco comments:    quit 2019  Vaping Use   Vaping Use: Never used  Substance and Sexual Activity   Alcohol use: No   Drug use: No   Sexual activity: Yes    Birth control/protection: None  Other Topics Concern   Not on file  Social History  Narrative   Not on file   Social Determinants of Health   Financial Resource Strain: Not on file  Food Insecurity: No Food Insecurity (12/02/2020)   Hunger Vital Sign    Worried About Running Out of Food in the Last Year: Never true    Ran Out of Food in the Last Year: Never true  Transportation Needs: Not on file  Physical Activity: Not on file  Stress: Not on file  Social Connections: Not on file  Intimate Partner Violence: Not on file    Home Medications:  Medications reconciled in EPIC  No current facility-administered medications on file prior to encounter.   Current Outpatient Medications on File Prior to Encounter  Medication Sig Dispense Refill   Liraglutide -Weight Management (SAXENDA) 18 MG/3ML SOPN Inject Saxenda 0.6 mg daily x 7 days, then 1.2 mg daily x 7 days, then1.8 mg daily x 7 days, then 2.4 mg daily x 7 days, then 3 mg daily x 14 days and thereafter subcutaneously 15 mL 0    Allergies:  Allergies  Allergen Reactions   Augmentin [Amoxicillin-Pot Clavulanate] Nausea And Vomiting    Has patient had a PCN reaction causing immediate rash, facial/tongue/throat swelling, SOB or lightheadedness with hypotension: no Has patient had a PCN reaction causing severe rash involving mucus membranes or skin necrosis: unknown Has patient had a PCN reaction that required hospitalization no Has patient had a PCN reaction occurring within the last 10 years: yes If all of the above answers are "NO", then may proceed with Cephalosporin use.     Physical Exam:  Temp:  [98.3 F (36.8 C)-98.6 F (37 C)] 98.6 F (37 C) (09/17 1800) Pulse Rate:  [74-113] 74 (09/17 1800) Resp:  [18-20] 20 (09/17 1800) BP: (93-144)/(64-101) 105/64 (09/17 1800) SpO2:  [95 %-100 %] 98 % (09/17 1800) Weight:  [111.1 kg] 111.1 kg (09/17 1335) Physical Exam Constitutional:      Appearance: Normal appearance. She is obese.     Comments: BMI 43  HENT:     Head: Normocephalic and atraumatic.   Cardiovascular:     Rate and Rhythm: Normal rate and regular rhythm.     Pulses: Normal pulses.     Heart sounds: Normal heart sounds.  Pulmonary:     Effort: Pulmonary effort is normal.     Breath sounds: Normal breath sounds.  Abdominal:     General: Bowel sounds are normal.     Palpations: Abdomen is soft.     Tenderness: There is abdominal tenderness (lower abdomen is slightly tender  suprapubically).  Genitourinary:    General: Normal vulva.  Rectum: Normal.     Comments: Scant to moderate blood noted. No visible blood clots noted on exam.  Musculoskeletal:        General: Normal range of motion.     Cervical back: Normal range of motion and neck supple.  Skin:    General: Skin is warm and dry.  Neurological:     General: No focal deficit present.     Mental Status: She is alert and oriented to person, place, and time.  Psychiatric:        Mood and Affect: Mood normal.        Behavior: Behavior normal.   Psychiatric:  Normal mood and affect, appropriate, no AH/VH Pelvic:  NEFG, no vulvar masses or lesions, normal vaginal mucosa,,anteverted uterus, no adnexal masses appreciated, due to body habitus. Difficult vaginal exam secondary to BMI. Scant to moderate flow noted ; no clots expressed with pelvic exam. Slightly tender with vaginal placement of Cytotec.   Labs/Studies:   CBC and Coags:  Lab Results  Component Value Date   WBC 13.0 (H) 04/03/2022   NEUTOPHILPCT 72 04/03/2022   EOSPCT 2 04/03/2022   BASOPCT 0 04/03/2022   LYMPHOPCT 21 04/03/2022   HGB 10.8 (L) 04/03/2022   HCT 33.7 (L) 04/03/2022   MCV 85.1 04/03/2022   PLT 333 04/03/2022   CMP:  Lab Results  Component Value Date   NA 135 04/03/2022   K 4.2 04/03/2022   CL 104 04/03/2022   CO2 23 04/03/2022   BUN 11 04/03/2022   CREATININE 0.84 04/03/2022   CREATININE 0.91 01/28/2022   CREATININE 0.59 02/07/2021   PROT 7.3 04/03/2022   BILITOT 1.1 04/03/2022   BILIDIR <0.1 01/28/2022   ALT 17  04/03/2022   AST 25 04/03/2022   ALKPHOS 68 04/03/2022   Other Labs:  Lab Results  Component Value Date   HCGBETAQNT 699 (H) 04/03/2022   HCGBETAQNT 115,463 (H) 07/15/2020   HCGBETAQNT 6,062 (H) 12/23/2019    Other Imaging: US OB LESS THAN 14 WEEKS WITH OB TRANSVAGINAL  Result Date: 04/03/2022 CLINICAL DATA:  Heavy bleeding, clots. Quantitative beta HCG of 699. EXAM: OBSTETRIC <14 WK [place] AND TRANSVAGINAL OB US TECHNIQUE: Both transabdominal and transvaginal ultrasound examinations were performed for complete evaluation of the gestation as well as the maternal uterus, adnexal regions, and pelvic cul-de-sac. Transvaginal technique was performed to assess early pregnancy. COMPARISON:  Fetal ultrasound dated 02/03/2021. FINDINGS: Intrauterine gestational sac: Single Yolk sac:  Not Visualized. Embryo:  Visualized. Cardiac Activity: Not Visualized. CRL:  1.44 mm   7 w   5 d                  [place] EDC: 11/15/2022 Subchorionic hemorrhage:  None visualized. Maternal uterus/adnexae: The right ovary appears normal. The left ovary is not seen. IMPRESSION: No evidence of fetal cardiac activity on M-mode or grey scale cine, consistent with intrauterine fetal demise. These results were called by telephone at the time of interpretation on 04/03/2022 at 5:16 pm to provider [facility] , who verbally acknowledged these results. Electronically Signed   By: Zerita Boers M.D.   On: 04/03/2022 17:16     Assessment / Plan:   MARIVEL MCCLARTY is a 31 y.o. [id number] presents with  pelvic cramping followed by vaginal bleeding that progressed from light to heavier flow once she arrived at the Emergency Department. Her pelvic ultrasound indicates an IUP of approximately 7 week size with no cardiac activity, thus resulting in a confirmed  dignosis of SAB in progress. Due to some orthostatic changes and drop in her H and H as well as ongoing heavier vaginal bleeding, she has been admitted to the Mother Baby unit for IV  hydration, one unit of RBCs and monitoring.  Spontaneous miscarriage: Causes of spontaneous miscarriage discussed with patient, including prevalence, common causes, and the expectation that this event does not increase the chance that she will miscarry again in the future. Emotional support given.  Management options discussed, including: Expectant, medical and surgical.  PLAN:  Pt has opted for: medication management Due to +orthostatics with continued bleeding and drop in H/H will transfuse 1u pRBCs and admit for overnight monitoring. Start misoprostol 800 ug per vagina. Risk of needing a D&C 10-20%. Regular diet Pain medication as ordered to include Percocet, Toradol or Dilaudid per the orders. Bedrest with BRPs. Routine vital signs. Packed RBCs have been ordered and are presently infusing.  Imagene Riches, CNM  04/03/2022 10:20 PM

## 2022-04-04 ENCOUNTER — Observation Stay: Payer: Federal, State, Local not specified - PPO

## 2022-04-04 DIAGNOSIS — Z3A01 Less than 8 weeks gestation of pregnancy: Secondary | ICD-10-CM | POA: Diagnosis not present

## 2022-04-04 DIAGNOSIS — O3680X0 Pregnancy with inconclusive fetal viability, not applicable or unspecified: Secondary | ICD-10-CM | POA: Diagnosis not present

## 2022-04-04 LAB — TYPE AND SCREEN
ABO/RH(D): O POS
Antibody Screen: NEGATIVE
Unit division: 0

## 2022-04-04 LAB — CBC
HCT: 29.6 % — ABNORMAL LOW (ref 36.0–46.0)
Hemoglobin: 9.6 g/dL — ABNORMAL LOW (ref 12.0–15.0)
MCH: 26.5 pg (ref 26.0–34.0)
MCHC: 32.4 g/dL (ref 30.0–36.0)
MCV: 81.8 fL (ref 80.0–100.0)
Platelets: 290 10*3/uL (ref 150–400)
RBC: 3.62 MIL/uL — ABNORMAL LOW (ref 3.87–5.11)
RDW: 14.8 % (ref 11.5–15.5)
WBC: 10.7 10*3/uL — ABNORMAL HIGH (ref 4.0–10.5)
nRBC: 0 % (ref 0.0–0.2)

## 2022-04-04 LAB — CBC WITH DIFFERENTIAL/PLATELET
Abs Immature Granulocytes: 0.05 10*3/uL (ref 0.00–0.07)
Basophils Absolute: 0 10*3/uL (ref 0.0–0.1)
Basophils Relative: 0 %
Eosinophils Absolute: 0.2 10*3/uL (ref 0.0–0.5)
Eosinophils Relative: 2 %
HCT: 30.2 % — ABNORMAL LOW (ref 36.0–46.0)
Hemoglobin: 9.7 g/dL — ABNORMAL LOW (ref 12.0–15.0)
Immature Granulocytes: 0 %
Lymphocytes Relative: 26 %
Lymphs Abs: 3.1 10*3/uL (ref 0.7–4.0)
MCH: 26.9 pg (ref 26.0–34.0)
MCHC: 32.1 g/dL (ref 30.0–36.0)
MCV: 83.7 fL (ref 80.0–100.0)
Monocytes Absolute: 0.5 10*3/uL (ref 0.1–1.0)
Monocytes Relative: 4 %
Neutro Abs: 8 10*3/uL — ABNORMAL HIGH (ref 1.7–7.7)
Neutrophils Relative %: 68 %
Platelets: 288 10*3/uL (ref 150–400)
RBC: 3.61 MIL/uL — ABNORMAL LOW (ref 3.87–5.11)
RDW: 14.6 % (ref 11.5–15.5)
WBC: 11.8 10*3/uL — ABNORMAL HIGH (ref 4.0–10.5)
nRBC: 0 % (ref 0.0–0.2)

## 2022-04-04 LAB — BPAM RBC
Blood Product Expiration Date: 202310242359
ISSUE DATE / TIME: 202309172101
Unit Type and Rh: 5100

## 2022-04-04 NOTE — Progress Notes (Signed)
Patient discharged. Discharge instructions given. Patient verbalizes understanding. Transported by staff. 

## 2022-04-04 NOTE — Progress Notes (Signed)
Benign Gynecology Progress Note  Admission Date: 04/03/2022 Current Date: 04/04/2022  WILENA TYNDALL is a 31 y.o. [id number] HD#1 admitted for management of a SAB.   History complicated by: Patient Active Problem List   Diagnosis Date Noted   Incomplete spontaneous abortion 04/03/2022   OB Hemorrhage 02/08/2021   History of cesarean section 02/07/2021   History of gestational diabetes 12/02/2020   Obesity, unspecified 02/05/2015   Pure hypercholesterolemia 02/05/2015   Kidney stones 02/05/2015   Sleep disturbance 02/05/2015   Acne 02/05/2015   Eczema 02/05/2015   History of PCOS 02/05/2015   ROS and patient/family/surgical history, located on admission H&P note dated 04/03/2022, have been reviewed, and there are no changes except as noted below  Yesterday/Overnight Events:  Received 1 UNIT PRBC and 853mg Cytotec. Subjective:  Pt reports feeling much better compared to how she was feeling in the ED.  She is ambulating to the BR without difficulty. Denies any SOB or lightheadedness when standing. Denies any pain. Reports she her bleeding picked up a little after the Cytotec but not was never heavy and is now "light".  Desires another pregnancy in the future-in a "few years", undecided about contraception.  Objective:   Vitals:   04/03/22 2135 04/04/22 0010 04/04/22 0340 04/04/22 0800  BP: (!) 110/59 (!) 116/58 115/67 118/66  Pulse: 87 (!) 58 78 84  Resp: '20 16 18 17  '$ Temp: 98.7 F (37.1 C) 98.3 F (36.8 C) 98 F (36.7 C) 98.1 F (36.7 C)  TempSrc: Oral Oral Oral Oral  SpO2: 100% 100% 100% 100%  Weight:      Height:       Temp:  [98 F (36.7 C)-98.7 F (37.1 C)] 98.1 F (36.7 C) (09/18 0800) Pulse Rate:  [58-130] 84 (09/18 0800) Resp:  [16-20] 17 (09/18 0800) BP: (93-144)/(55-101) 118/66 (09/18 0800) SpO2:  [95 %-100 %] 100 % (09/18 0800) Weight:  [111.1 kg] 111.1 kg (09/17 1335) I/O last 3 completed shifts: In: 2393.3 [P.O.:1920; Blood:473.3] Out: 425  [Urine:425] No intake/output data recorded.  Intake/Output Summary (Last 24 hours) at 04/04/2022 0852 Last data filed at 04/04/2022 0700 Gross per 24 hour  Intake 2393.33 ml  Output 425 ml  Net 1968.33 ml     Current Vital Signs 24h Vital Sign Ranges  T 98.1 F (36.7 C) Temp  Avg: 98.4 F (36.9 C)  Min: 98 F (36.7 C)  Max: 98.7 F (37.1 C)  BP 118/66 BP  Min: 93/67  Max: 144/101  HR 84 Pulse  Avg: 91.1  Min: 58  Max: 130  RR 17 Resp  Avg: 18.9  Min: 16  Max: 20  SaO2 100 % Room Air SpO2  Avg: 99.4 %  Min: 95 %  Max: 100 %           24 Hour I/O Current Shift I/O  Time Ins Outs 09/17 0701 - 09/18 0700 In: 2393.3 [P.O.:1920] Out: 425 [Urine:425] No intake/output data recorded.    Physical exam: General appearance: alert, cooperative, and appears stated age Abdomen: soft, non-tender; bowel sounds normal; no masses, no organomegaly GU: No gross VB Lungs: clear to auscultation bilaterally Heart: regular rate and rhythm and no MRGs Extremities: no redness or tenderness in the calves or thighs, edema trace Skin: no lesions Psych: appropriate  Recent Labs  Lab 04/03/22 1348  NA 135  K 4.2  CL 104  CO2 23  BUN 11  CREATININE 0.84  GLUCOSE 96   Recent Labs  Lab 04/03/22  1348 04/03/22 1743 04/04/22 0445  WBC 11.2* 13.0* 11.8*  HGB 12.6 10.8* 9.7*  HCT 39.5 33.7* 30.2*  PLT 393 333 288    Recent Labs  Lab 04/03/22 1348  CALCIUM 8.4*   No results for input(s): "INR", "APTT" in the last 168 hours.    Recent Labs  Lab 04/03/22 1348  ALKPHOS 68  BILITOT 1.1  PROT 7.3  ALT 17  AST 25    Recent Labs  Lab 04/03/22 1348 04/03/22 1743 04/04/22 0445  WBC 11.2* 13.0* 11.8*  HGB 12.6 10.8* 9.7*  HCT 39.5 33.7* 30.2*  PLT 393 333 288   Recent Labs  Lab 04/03/22 1348  NA 135  K 4.2  CL 104  CO2 23  BUN 11  CREATININE 0.84  CALCIUM 8.4*  PROT 7.3  BILITOT 1.1  ALKPHOS 68  ALT 17  AST 25  GLUCOSE 96    Radiology Repeat US ordered    Assessment & Plan:   Problem List Items Addressed This Visit   None Visit Diagnoses     Spontaneous abortion    -  Primary   Vaginal bleeding       Symptomatic anemia          Repeat CBC at 1100 Repeat US ordered, consider D and C if POC present. Plan for discharge later today  FU at [facility] in 1 week   Roberto Scales, CNM  Mosetta Pigeon, [place]  '@TODAY'$ @  8:59 AM

## 2022-04-04 NOTE — Discharge Summary (Signed)
Physician Discharge Summary  Patient ID: SUMNER BOESCH MRN: [id number] DOB/AGE: 1990-09-26 31 y.o.  Admit date: 04/03/2022 Discharge date: 04/04/2022  Admission Diagnoses:  Discharge Diagnoses:  Principal Problem:   Incomplete spontaneous abortion Anemia from Blood Loss   Discharged Condition: stable  Hospital Course: Pt was evaluated for heavy vaginal bleeding in the ED, US showed "No evidence of fetal cardiac activity on M-mode or grey scale cine, consistent with intrauterine fetal demise" She became symptomatic from the blood loss and  was given 1 unit PRB and IV fluids, she was then given  879mg Cytotec. She was given Percocet for pain over night. Since then, her bleeding and vital signs stabilized. A repeat UKoreashows gestational sac in the cervical canal. UKoreareviewed with Dr CMarcelline Mates Pt stable and desires to go home. Reviewed UKoreawith pt, anticipatory  guidance given.   Consults: None  Significant Diagnostic Studies: radiology: Ultrasound:    IMPRESSION: There is a gestational sac containing fetal pole in the cervical canal. There is no fetal cardiac activity consistent with fetal demise. There is no demonstrable subchorionic bleed.  Treatments: IV hydration, 1 unit PRBC  Discharge Exam: Blood pressure 118/86, pulse 85, temperature 98.7 F (37.1 C), temperature source Oral, resp. rate 17, height '5\' 3"'$  (1.6 m), weight 111.1 kg, last menstrual period 09/24/2021, SpO2 100 %, currently breastfeeding. General appearance: alert, cooperative, and no distress Resp: clear to auscultation bilaterally Extremities: edema trace BLE  Bleeding small to moderate   CBC    Component Value Date/Time   WBC 10.7 (H) 04/04/2022 0944   RBC 3.62 (L) 04/04/2022 0944   HGB 9.6 (L) 04/04/2022 0944   HGB 11.4 03/23/2021 1141   HCT 29.6 (L) 04/04/2022 0944   HCT 36.2 03/23/2021 1141   PLT 290 04/04/2022 0944   PLT 315 03/23/2021 1141   MCV 81.8 04/04/2022 0944   MCV 83 03/23/2021 1141   MCV  89 04/30/2014 1508   MCH 26.5 04/04/2022 0944   MCHC 32.4 04/04/2022 0944   RDW 14.8 04/04/2022 0944   RDW 14.1 03/23/2021 1141   RDW 13.6 04/30/2014 1508   LYMPHSABS 3.1 04/04/2022 0445   LYMPHSABS 2.0 04/30/2014 1508   MONOABS 0.5 04/04/2022 0445   MONOABS 0.8 04/30/2014 1508   EOSABS 0.2 04/04/2022 0445   EOSABS 0.1 04/30/2014 1508   BASOSABS 0.0 04/04/2022 0445   BASOSABS 0.0 04/30/2014 1508     Disposition: Discharge disposition: 01-Home or Self Care      Reviewed this morning's CBC, pt will pick up her own Iron supplements at the pharmacy.   Allergies as of 04/04/2022       Reactions   Augmentin [amoxicillin-pot Clavulanate] Nausea And Vomiting   Has patient had a PCN reaction causing immediate rash, facial/tongue/throat swelling, SOB or lightheadedness with hypotension: no Has patient had a PCN reaction causing severe rash involving mucus membranes or skin necrosis: unknown Has patient had a PCN reaction that required hospitalization no Has patient had a PCN reaction occurring within the last 10 years: yes If all of the above answers are "NO", then may proceed with Cephalosporin use.        Medication List     TAKE these medications    meloxicam 15 MG tablet Commonly known as: MOBIC Take 15 mg by mouth daily. For 60 days   Saxenda 18 MG/3ML Sopn Generic drug: Liraglutide -Weight Management Inject Saxenda 0.6 mg daily x 7 days, then 1.2 mg daily x 7 days, then1.8 mg daily  x 7 days, then 2.4 mg daily x 7 days, then 3 mg daily x 14 days and thereafter subcutaneously        [facility]. Schedule an appointment as soon as possible for a visit in 1 week(s).   Specialty: Obstetrics and Gynecology Contact information: [address] [place] [address] [phone]                Signed: Jillene Bucks [facility] 04/04/2022, 3:53 PM

## 2022-04-05 LAB — SURGICAL PATHOLOGY

## 2022-04-05 LAB — PREPARE RBC (CROSSMATCH)

## 2022-04-06 ENCOUNTER — Telehealth (HOSPITAL_COMMUNITY): Payer: Self-pay | Admitting: *Deleted

## 2022-04-06 NOTE — Telephone Encounter (Signed)
Attempted to call patient regarding upcoming cardiac CT appointment. °Left message on voicemail with name and callback number ° °Jadian Karman RN Navigator Cardiac Imaging °[facility] °[phone] Office °[phone] Cell ° °

## 2022-04-07 ENCOUNTER — Ambulatory Visit (HOSPITAL_COMMUNITY): Admission: RE | Admit: 2022-04-07 | Payer: Federal, State, Local not specified - PPO | Source: Ambulatory Visit

## 2022-04-07 NOTE — Addendum Note (Signed)
Addended by: Rodman Key on: 04/07/2022 11:27 AM   Modules accepted: Orders

## 2022-04-07 NOTE — Progress Notes (Signed)
Sleep study order was previously modified/cancelled by another user.  This is replacement for original sleep study order.

## 2022-04-11 ENCOUNTER — Telehealth: Payer: Self-pay | Admitting: Internal Medicine

## 2022-04-11 DIAGNOSIS — F411 Generalized anxiety disorder: Secondary | ICD-10-CM | POA: Diagnosis not present

## 2022-04-11 NOTE — Telephone Encounter (Signed)
Pt c/o medication issue:  1. Name of Medication: Liraglutide -Weight Management (SAXENDA) 18 MG/3ML SOPN  2. How are you currently taking this medication (dosage and times per day)?   Inject Saxenda 0.6 mg daily x 7 days, then 1.2 mg daily x 7 days, then1.8 mg daily x 7 days, then 2.4 mg daily x 7 days, then 3 mg daily x 14 days   3. Are you having a reaction (difficulty breathing--STAT)? No  4. What is your medication issue? Pt states that she has not been able to get medication due to pharmacies being out of it. Pt would like a callback from Pharmacist to see if something else can be prescribed. Please advise

## 2022-04-12 ENCOUNTER — Ambulatory Visit (INDEPENDENT_AMBULATORY_CARE_PROVIDER_SITE_OTHER): Payer: Federal, State, Local not specified - PPO | Admitting: Licensed Practical Nurse

## 2022-04-12 VITALS — BP 126/84 | Ht 63.0 in | Wt 242.0 lb

## 2022-04-12 DIAGNOSIS — O039 Complete or unspecified spontaneous abortion without complication: Secondary | ICD-10-CM

## 2022-04-12 NOTE — Telephone Encounter (Signed)
Called and let patient know that both Saxenda and Mancel Parsons are on backorder and not expected to be available until Jan. Encouraged patient to reach back out in Jan to see if medication is available.

## 2022-04-13 ENCOUNTER — Encounter: Payer: Self-pay | Admitting: Licensed Practical Nurse

## 2022-04-13 LAB — CBC
Hematocrit: 30.8 % — ABNORMAL LOW (ref 34.0–46.6)
Hemoglobin: 10.4 g/dL — ABNORMAL LOW (ref 11.1–15.9)
MCH: 27.4 pg (ref 26.6–33.0)
MCHC: 33.8 g/dL (ref 31.5–35.7)
MCV: 81 fL (ref 79–97)
Platelets: 454 10*3/uL — ABNORMAL HIGH (ref 150–450)
RBC: 3.8 x10E6/uL (ref 3.77–5.28)
RDW: 14.1 % (ref 11.7–15.4)
WBC: 6.4 10*3/uL (ref 3.4–10.8)

## 2022-04-13 LAB — BETA HCG QUANT (REF LAB): hCG Quant: 5 m[IU]/mL

## 2022-04-15 NOTE — Progress Notes (Signed)
SUBJECTIVE: Here for SAB fu, on 9/17 she was admitted to the hospital for treatment of a MAB, she was given Cytotec and 1 unit of PRBC. A Repeat US on 9/18 showed  a gestational sac in the cervical canal, pt was discharged with anticipatory guidance.  Pt reports once she was home she had some cramping, her bleeding picked up and she passed the sac and then her bleeding became light.   -May desire another pregnancy in a year, undecided about birth control -Currently trying to get treatment for weight loss, has a script but unable to fill it as her pharmacy does not have the medication. -Unsure of her last annual exam-has an OB/GYN in [place], she now lives in [place] -has hx PCOS, cycles have always been irregular, has never been on Metformin   OBJECTIVE: BP 126/84   Ht '5\' 3"'$  (1.6 m)   Wt 242 lb (109.8 kg)   LMP 09/24/2021 (Approximate)   BMI 42.87 kg/m  GEN; NAD PE not necessary today   ASSESSMENT: SAB fu  PLAN: -Beta today, will need to follow until less than 5 -Pregnancy planning, reviewed 10 percent weight loss could be valuable (pt currently working on this), start PNV and folic acid -Pt may schedule annual exam at that office when due   Roberto Scales, [place]  04/15/22  11:12 AM

## 2022-04-27 ENCOUNTER — Telehealth (HOSPITAL_COMMUNITY): Payer: Self-pay | Admitting: Emergency Medicine

## 2022-04-27 DIAGNOSIS — R079 Chest pain, unspecified: Secondary | ICD-10-CM

## 2022-04-27 MED ORDER — METOPROLOL TARTRATE 100 MG PO TABS: 100.0000 mg | ORAL_TABLET | Freq: Once | ORAL | 0 refills | Status: DC

## 2022-04-27 NOTE — Telephone Encounter (Signed)
Reaching out to patient to offer assistance regarding upcoming cardiac imaging study; pt verbalizes understanding of appt date/time, parking situation and where to check in, pre-test NPO status and medications ordered, and verified current allergies; name and call back number provided for further questions should they arise Susan Bond RN Navigator Cardiac Imaging [facility] [phone] office [phone] cell  Arrival 730 , w/c entrance Denies iv issues '100mg'$  metoprolol tartrate

## 2022-04-29 ENCOUNTER — Ambulatory Visit (HOSPITAL_COMMUNITY): Admission: RE | Admit: 2022-04-29 | Payer: Federal, State, Local not specified - PPO | Source: Ambulatory Visit

## 2022-05-11 ENCOUNTER — Telehealth (HOSPITAL_COMMUNITY): Payer: Self-pay | Admitting: Emergency Medicine

## 2022-05-11 NOTE — Telephone Encounter (Signed)
Attempted to call patient regarding upcoming cardiac CT appointment. °Left message on voicemail with name and callback number °Biagio Snelson RN Navigator Cardiac Imaging °[facility] °[phone] Office °[phone] Cell ° °

## 2022-05-12 ENCOUNTER — Ambulatory Visit: Admission: RE | Admit: 2022-05-12 | Payer: Federal, State, Local not specified - PPO | Source: Ambulatory Visit

## 2022-05-15 ENCOUNTER — Encounter (HOSPITAL_BASED_OUTPATIENT_CLINIC_OR_DEPARTMENT_OTHER): Payer: Federal, State, Local not specified - PPO | Admitting: Cardiovascular Disease

## 2022-05-26 ENCOUNTER — Ambulatory Visit: Payer: Federal, State, Local not specified - PPO | Admitting: Student

## 2022-05-26 NOTE — Progress Notes (Incomplete)
   HPI: Susan Hubbard is a 31 y.o. female patient referred to pharmacy clinic by Dr. Marland Kitchen to initiate weight loss therapy with GLP1-RA.  Most recent BMI ***.  Stop medication x 7 days for surgical procedures requiring anesthesia  Significant medical history:                  *** If diabetic and on insulin/sulfonylurea, can consider reducing dose to reduce risk of hypoglycemia  *** Follow-up visit  Assess % weight loss Assess adverse effects Missed doses  Current weight management medications:   Previously tried meds:   Current meds that may affect weight:   Baseline weight/BMI:   Insurance payor:   Diet:  -Breakfast: -Lunch: -Dinner: -Snacks: -Drinks:  {diet history for weight loss:28257}  Exercise:  {types:28256}  Family History:   Confirmed patient not ***pregnant and no personal or family history of medullary thyroid carcinoma (MTC) or Multiple Endocrine Neoplasia syndrome type 2 (MEN 2).   Social History:   Labs: Lab Results  Component Value Date   HGBA1C 5.3 08/05/2020    Wt Readings from Last 1 Encounters:  04/12/22 242 lb (109.8 kg)    BP Readings from Last 1 Encounters:  04/12/22 126/84   Pulse Readings from Last 1 Encounters:  04/04/22 85       Component Value Date/Time   CHOL 162 11/14/2019 0913   TRIG 85 11/14/2019 0913   HDL 35 (L) 11/14/2019 0913   CHOLHDL 4.6 (H) 11/14/2019 0913   LDLCALC 111 (H) 11/14/2019 0913    Past Medical History:  Diagnosis Date   Anemia    Bacterial vaginosis 11/11/2015   History of   Chlamydia    Chronic kidney disease    h/o kidney stones   Family history of adverse reaction to anesthesia    mom unsure of what   Gestational diabetes    Hyperlipidemia    Irregular menses    Oligomenorrhea    PCOS (polycystic ovarian syndrome)    PCOS (polycystic ovarian syndrome)    Vaccine for human papilloma virus (HPV) types 6, 11, 16, and 18 administered    Vitamin D deficiency    Vitamin  D deficiency     Current Outpatient Medications on File Prior to Visit  Medication Sig Dispense Refill   metoprolol tartrate (LOPRESSOR) 100 MG tablet Take 1 tablet (100 mg total) by mouth once for 1 dose. Please take one time dose '100mg'$  metoprolol tartrate 2 hr prior to cardiac CT for HR control IF HR >55bpm. 1 tablet 0   No current facility-administered medications on file prior to visit.    Allergies  Allergen Reactions   Augmentin [Amoxicillin-Pot Clavulanate] Nausea And Vomiting    Has patient had a PCN reaction causing immediate rash, facial/tongue/throat swelling, SOB or lightheadedness with hypotension: no Has patient had a PCN reaction causing severe rash involving mucus membranes or skin necrosis: unknown Has patient had a PCN reaction that required hospitalization no Has patient had a PCN reaction occurring within the last 10 years: yes If all of the above answers are "NO", then may proceed with Cephalosporin use.     Assessment/Plan   No problem-specific Assessment & Plan notes found for this encounter.  '@MTPCOMPLETEDLIST'$ @  {f/u with PharmD:28259}  Tommy Medal PharmD CPP [facility] [address] [place], [place] [address]

## 2022-06-08 ENCOUNTER — Ambulatory Visit (HOSPITAL_BASED_OUTPATIENT_CLINIC_OR_DEPARTMENT_OTHER): Payer: Federal, State, Local not specified - PPO | Attending: Internal Medicine | Admitting: Cardiology

## 2022-06-30 DIAGNOSIS — M9902 Segmental and somatic dysfunction of thoracic region: Secondary | ICD-10-CM | POA: Diagnosis not present

## 2022-06-30 DIAGNOSIS — M9906 Segmental and somatic dysfunction of lower extremity: Secondary | ICD-10-CM | POA: Diagnosis not present

## 2022-06-30 DIAGNOSIS — M9901 Segmental and somatic dysfunction of cervical region: Secondary | ICD-10-CM | POA: Diagnosis not present

## 2022-06-30 DIAGNOSIS — M9903 Segmental and somatic dysfunction of lumbar region: Secondary | ICD-10-CM | POA: Diagnosis not present

## 2022-07-01 DIAGNOSIS — M9903 Segmental and somatic dysfunction of lumbar region: Secondary | ICD-10-CM | POA: Diagnosis not present

## 2022-07-01 DIAGNOSIS — M9901 Segmental and somatic dysfunction of cervical region: Secondary | ICD-10-CM | POA: Diagnosis not present

## 2022-07-01 DIAGNOSIS — M9902 Segmental and somatic dysfunction of thoracic region: Secondary | ICD-10-CM | POA: Diagnosis not present

## 2022-07-01 DIAGNOSIS — M9906 Segmental and somatic dysfunction of lower extremity: Secondary | ICD-10-CM | POA: Diagnosis not present

## 2022-07-05 ENCOUNTER — Telehealth: Payer: Self-pay | Admitting: Internal Medicine

## 2022-07-05 NOTE — Telephone Encounter (Signed)
Patients states she is calling in regards to the meds that our office was trying to prescribe for her. Please advise

## 2022-07-06 NOTE — Telephone Encounter (Signed)
The only medication I see that was prescribed from our office was a single dose of metoprolol back in October.

## 2022-08-17 ENCOUNTER — Ambulatory Visit: Payer: Federal, State, Local not specified - PPO | Admitting: Family Medicine

## 2022-08-18 DIAGNOSIS — M545 Low back pain, unspecified: Secondary | ICD-10-CM | POA: Diagnosis not present

## 2022-08-18 DIAGNOSIS — Z6841 Body Mass Index (BMI) 40.0 and over, adult: Secondary | ICD-10-CM | POA: Diagnosis not present

## 2022-09-07 DIAGNOSIS — K080 Exfoliation of teeth due to systemic causes: Secondary | ICD-10-CM | POA: Diagnosis not present

## 2022-09-14 DIAGNOSIS — R200 Anesthesia of skin: Secondary | ICD-10-CM | POA: Diagnosis not present

## 2022-10-25 DIAGNOSIS — G5603 Carpal tunnel syndrome, bilateral upper limbs: Secondary | ICD-10-CM | POA: Insufficient documentation

## 2022-10-26 DIAGNOSIS — G5603 Carpal tunnel syndrome, bilateral upper limbs: Secondary | ICD-10-CM | POA: Diagnosis not present

## 2022-11-10 DIAGNOSIS — R196 Halitosis: Secondary | ICD-10-CM | POA: Diagnosis not present

## 2022-11-10 DIAGNOSIS — R1314 Dysphagia, pharyngoesophageal phase: Secondary | ICD-10-CM | POA: Diagnosis not present

## 2022-11-10 DIAGNOSIS — K219 Gastro-esophageal reflux disease without esophagitis: Secondary | ICD-10-CM | POA: Diagnosis not present

## 2022-11-10 DIAGNOSIS — G4733 Obstructive sleep apnea (adult) (pediatric): Secondary | ICD-10-CM | POA: Diagnosis not present

## 2022-11-15 ENCOUNTER — Other Ambulatory Visit (HOSPITAL_BASED_OUTPATIENT_CLINIC_OR_DEPARTMENT_OTHER): Payer: Self-pay

## 2022-11-15 DIAGNOSIS — G4710 Hypersomnia, unspecified: Secondary | ICD-10-CM

## 2022-12-19 ENCOUNTER — Encounter (HOSPITAL_BASED_OUTPATIENT_CLINIC_OR_DEPARTMENT_OTHER): Payer: Self-pay

## 2022-12-19 ENCOUNTER — Ambulatory Visit (HOSPITAL_BASED_OUTPATIENT_CLINIC_OR_DEPARTMENT_OTHER): Payer: Federal, State, Local not specified - PPO | Admitting: Internal Medicine

## 2022-12-19 DIAGNOSIS — G4710 Hypersomnia, unspecified: Secondary | ICD-10-CM

## 2023-02-06 DIAGNOSIS — S39012A Strain of muscle, fascia and tendon of lower back, initial encounter: Secondary | ICD-10-CM | POA: Diagnosis not present

## 2023-02-06 DIAGNOSIS — M25561 Pain in right knee: Secondary | ICD-10-CM | POA: Diagnosis not present

## 2023-04-19 ENCOUNTER — Ambulatory Visit: Payer: Federal, State, Local not specified - PPO | Admitting: Nurse Practitioner

## 2023-05-03 ENCOUNTER — Ambulatory Visit (INDEPENDENT_AMBULATORY_CARE_PROVIDER_SITE_OTHER): Payer: Self-pay | Admitting: Physician Assistant

## 2023-05-03 DIAGNOSIS — Z91199 Patient's noncompliance with other medical treatment and regimen due to unspecified reason: Secondary | ICD-10-CM

## 2023-05-03 NOTE — Progress Notes (Unsigned)
Patient was not seen for appt d/t no call, no show, or late arrival >10 mins past appt time.    Debera Lat PA [facility] [address] [place], [place] [address] [phone] (phone) [phone] (fax) [facility]

## 2023-08-02 ENCOUNTER — Ambulatory Visit: Payer: Federal, State, Local not specified - PPO | Admitting: Podiatry

## 2023-08-16 ENCOUNTER — Ambulatory Visit: Payer: Federal, State, Local not specified - PPO | Admitting: Podiatry

## 2023-08-16 ENCOUNTER — Ambulatory Visit
Admission: EM | Admit: 2023-08-16 | Discharge: 2023-08-16 | Disposition: A | Discharge status: Home / Self Care | Payer: BC Managed Care – PPO

## 2023-08-16 DIAGNOSIS — J689 Unspecified respiratory condition due to chemicals, gases, fumes and vapors: Secondary | ICD-10-CM | POA: Diagnosis not present

## 2023-08-16 DIAGNOSIS — R0602 Shortness of breath: Secondary | ICD-10-CM

## 2023-08-16 NOTE — ED Triage Notes (Signed)
Patient to Urgent Care with complaints of cough/ shortness of breath with inhalation. Some tightness with exertion.   Symptoms started today after using some cleaning products. Has used the same products before without any issue.   Denies any hx of asthma.

## 2023-08-16 NOTE — Discharge Instructions (Signed)
Go to the emergency department for evaluation of your shortness of breath after inhaled exposure to chemical.

## 2023-08-16 NOTE — ED Provider Notes (Signed)
Susan Hubbard    CSN: [id number] Arrival date & time: 08/16/23  1628      History   Chief Complaint Chief Complaint  Patient presents with   Cough    HPI Susan Hubbard is a 33 y.o. female.  Patient presents with cough and shortness of breath since noon today when she was cleaning with Clorox.  She states her chest feels tight with exertion.  No history of respiratory disease.  Former smoker.  The history is provided by the patient and medical records.    Past Medical History:  Diagnosis Date   Anemia    Bacterial vaginosis 11/11/2015   History of   Chlamydia    Chronic kidney disease    h/o kidney stones   Family history of adverse reaction to anesthesia    mom unsure of what   Gestational diabetes    Hyperlipidemia    Irregular menses    Oligomenorrhea    PCOS (polycystic ovarian syndrome)    PCOS (polycystic ovarian syndrome)    Vaccine for human papilloma virus (HPV) types 6, 11, 16, and 18 administered    Vitamin D deficiency    Vitamin D deficiency     Patient Active Problem List   Diagnosis Date Noted   Incomplete spontaneous abortion 04/03/2022   OB Hemorrhage 02/08/2021   History of cesarean section 02/07/2021   History of gestational diabetes 12/02/2020   Obesity, unspecified 02/05/2015   Pure hypercholesterolemia 02/05/2015   Kidney stones 02/05/2015   Sleep disturbance 02/05/2015   Acne 02/05/2015   Eczema 02/05/2015   History of PCOS 02/05/2015    Past Surgical History:  Procedure Laterality Date   CESAREAN SECTION     CESAREAN SECTION N/A 02/06/2021   Procedure: CESAREAN SECTION;  Surgeon: Hermina Staggers, MD;  Location: [facility] ORS;  Service: Obstetrics;  Laterality: N/A;   HYSTEROSCOPY WITH D & C N/A 10/20/2015   Procedure: DILATATION AND CURETTAGE /HYSTEROSCOPY;  Surgeon: Vena Austria, MD;  Location: [facility] ORS;  Service: Gynecology;  Laterality: N/A;    OB History     Gravida  5   Para  1   Term  1   Preterm       AB  3   Living  1      SAB  1   IAB  2   Ectopic      Multiple      Live Births  1            Home Medications    Prior to Admission medications   Medication Sig Start Date End Date Taking? Authorizing Provider  metoprolol tartrate (LOPRESSOR) 100 MG tablet Take 1 tablet (100 mg total) by mouth once for 1 dose. Please take one time dose 100mg  metoprolol tartrate 2 hr prior to cardiac CT for HR control IF HR >55bpm. Patient not taking: Reported on 08/16/2023 04/27/22 04/27/22  Orbie Pyo, MD    Family History Family History  Problem Relation Age of Onset   Diabetes Mother        type II   Hyperlipidemia Mother    Hypertension Mother    GER disease Mother    Sleep apnea Mother    Hypertension Father    Hyperlipidemia Maternal Grandmother    Diabetes Maternal Grandmother        Type II   Cancer Maternal Grandfather        Lung cancer   Hypertension Paternal Grandmother  Brain cancer Paternal Grandmother    Lung cancer Paternal Grandmother    Cancer Paternal Grandfather    Diabetes Paternal Grandfather    Hyperlipidemia Paternal Grandfather    Cataracts Paternal Grandfather     Social History Social History   Tobacco Use   Smoking status: Former    Current packs/day: 0.25    Average packs/day: 0.3 packs/day for 8.0 years (2.0 ttl pk-yrs)    Types: Cigarettes   Smokeless tobacco: Never   Tobacco comments:    quit 2019  Vaping Use   Vaping status: Never Used  Substance Use Topics   Alcohol use: No   Drug use: No     Allergies   Augmentin [amoxicillin-pot clavulanate]   Review of Systems Review of Systems  Constitutional:  Negative for chills and fever.  Respiratory:  Positive for cough, chest tightness and shortness of breath.   Cardiovascular:  Negative for chest pain and palpitations.  Neurological:  Negative for dizziness and syncope.     Physical Exam Triage Vital Signs ED Triage Vitals  Encounter Vitals Group     BP  08/16/23 1708 133/84     Systolic BP Percentile --      Diastolic BP Percentile --      Pulse Rate 08/16/23 1655 100     Resp 08/16/23 1655 18     Temp 08/16/23 1655 98.7 F (37.1 C)     Temp src --      SpO2 08/16/23 1655 98 %     Weight --      Height --      Head Circumference --      Peak Flow --      Pain Score 08/16/23 1658 8     Pain Loc --      Pain Education --      Exclude from Growth Chart --    No data found.  Updated Vital Signs BP 133/84   Pulse 100   Temp 98.7 F (37.1 C)   Resp 18   LMP 07/19/2023   SpO2 98%   Breastfeeding No   Visual Acuity Right Eye Distance:   Left Eye Distance:   Bilateral Distance:    Right Eye Near:   Left Eye Near:    Bilateral Near:     Physical Exam Constitutional:      General: She is not in acute distress. HENT:     Mouth/Throat:     Mouth: Mucous membranes are moist.  Cardiovascular:     Rate and Rhythm: Normal rate and regular rhythm.     Heart sounds: Normal heart sounds.  Pulmonary:     Effort: Pulmonary effort is normal. No respiratory distress.     Breath sounds: Normal breath sounds.  Neurological:     Mental Status: She is alert.      UC Treatments / Results  Labs (all labs ordered are listed, but only abnormal results are displayed) Labs Reviewed - No data to display  EKG   Radiology No results found.  Procedures Procedures (including critical care time)  Medications Ordered in UC Medications - No data to display  Initial Impression / Assessment and Plan / UC Course  I have reviewed the triage vital signs and the nursing notes.  Pertinent labs & imaging results that were available during my care of the patient were reviewed by me and considered in my medical decision making (see chart for details).    Shortness of breath, respiratory condition after cleaning  with Clorox.  Patient's exposure to Clorox occurred at noon today.  Her lungs are clear and O2 sat is 98% on room air.  Because  she feels short of breath after inhaling a chemical, sending her to the ED for evaluation.  She declines EMS and feels stable to drive herself to [facility] ED.  Final Clinical Impressions(s) / UC Diagnoses   Final diagnoses:  Shortness of breath  Respiratory condition due to chemical fumes and vapors [facility])     Discharge Instructions      Go to the emergency department for evaluation of your shortness of breath after inhaled exposure to chemical.       ED Prescriptions   None    PDMP not reviewed this encounter.   Mickie Bail, NP 08/16/23 1730

## 2023-09-01 ENCOUNTER — Ambulatory Visit: Payer: Federal, State, Local not specified - PPO | Admitting: Nurse Practitioner

## 2023-09-01 VITALS — BP 128/82 | HR 78 | Temp 98.0°F | Ht 63.0 in | Wt 252.8 lb

## 2023-09-01 DIAGNOSIS — E559 Vitamin D deficiency, unspecified: Secondary | ICD-10-CM | POA: Diagnosis not present

## 2023-09-01 DIAGNOSIS — R5383 Other fatigue: Secondary | ICD-10-CM

## 2023-09-01 DIAGNOSIS — E785 Hyperlipidemia, unspecified: Secondary | ICD-10-CM

## 2023-09-01 DIAGNOSIS — G479 Sleep disorder, unspecified: Secondary | ICD-10-CM

## 2023-09-01 DIAGNOSIS — Z8632 Personal history of gestational diabetes: Secondary | ICD-10-CM

## 2023-09-01 NOTE — Progress Notes (Signed)
 New Patient Office Visit  Subjective   Patient ID: Susan Hubbard, female    DOB: Apr 25, 1991  Age: 33 y.o. MRN: [id number]  CC:  Chief Complaint  Patient presents with   Establish Care   Discussed the use of a AI scribe software for clinical note transcription with the patient, who gave verbal consent to proceed.  HPI Susan Hubbard presents to establish care.  Patient states that he has not been to the PCP in awhile.  She has history of gestational diabetes, hyperlipidemia, PCOS and morbid obesity and sleep disturbances characterized by significant snoring at night.  Chronic  Patient states that she was referred for home sleep study by [facility] for evaluation of sleep apnea but she has never received the result back.  She is concerned about her weight and has been attempting to lose weight independently. She finds it challenging to maintain dietary changes. She is interested in weight loss medications but is cautious about potential side effects.  She is followed by OB/GYN   Health Maintenance  Topic Date Due   DTaP/Tdap/Td (3 - Td or Tdap) 05/18/2021   Cervical Cancer Screening (HPV/Pap Cotest)  05/25/2021   COVID-19 Vaccine (1 - 2024-25 season) 09/16/2023 (Originally 03/19/2023)   INFLUENZA VACCINE  10/16/2023 (Originally 02/16/2023)   Hepatitis C Screening  Completed   HIV Screening  Completed   HPV VACCINES  Aged Out    There are no preventive care reminders to display for this patient.  Outpatient Encounter Medications as of 09/01/2023  Medication Sig   [DISCONTINUED] metoprolol tartrate (LOPRESSOR) 100 MG tablet Take 1 tablet (100 mg total) by mouth once for 1 dose. Please take one time dose 100mg  metoprolol tartrate 2 hr prior to cardiac CT for HR control IF HR >55bpm. (Patient not taking: Reported on 08/16/2023)   No facility-administered encounter medications on file as of 09/01/2023.    Past Medical History:  Diagnosis Date   Anemia    Bacterial vaginosis  11/11/2015   History of   Blood transfusion without reported diagnosis 9/23   Chlamydia    Chronic kidney disease    h/o kidney stones   Family history of adverse reaction to anesthesia    mom unsure of what   GERD (gastroesophageal reflux disease)    Gestational diabetes    Hyperlipidemia    Irregular menses    Oligomenorrhea    PCOS (polycystic ovarian syndrome)    PCOS (polycystic ovarian syndrome)    Vaccine for human papilloma virus (HPV) types 6, 11, 16, and 18 administered    Vitamin D deficiency    Vitamin D deficiency     Past Surgical History:  Procedure Laterality Date   CESAREAN SECTION     CESAREAN SECTION N/A 02/06/2021   Procedure: CESAREAN SECTION;  Surgeon: Hermina Staggers, MD;  Location: [facility] ORS;  Service: Obstetrics;  Laterality: N/A;   HYSTEROSCOPY WITH D & C N/A 10/20/2015   Procedure: DILATATION AND CURETTAGE /HYSTEROSCOPY;  Surgeon: Vena Austria, MD;  Location: [facility] ORS;  Service: Gynecology;  Laterality: N/A;    Family History  Problem Relation Age of Onset   Diabetes Mother        type II   Hyperlipidemia Mother    Hypertension Mother    GER disease Mother    Sleep apnea Mother    Asthma Mother    COPD Mother    Obesity Mother    Hypertension Father    Stroke Maternal Uncle  Arthritis Maternal Grandmother    Hyperlipidemia Maternal Grandmother    Diabetes Maternal Grandmother        Type II   Asthma Maternal Grandmother    Obesity Maternal Grandmother    Cancer Maternal Grandfather        Lung cancer   Hypertension Paternal Grandmother    Brain cancer Paternal Grandmother    Lung cancer Paternal Grandmother    Cancer Paternal Grandmother    Cancer Paternal Grandfather    Diabetes Paternal Grandfather    Hyperlipidemia Paternal Grandfather    Cataracts Paternal Grandfather     Social History   Socioeconomic History   Marital status: Significant Other    Spouse name: Not on file   Number of children: Not on file   Years of  education: Not on file   Highest education level: Associate degree: academic program  Occupational History   Not on file  Tobacco Use   Smoking status: Former    Current packs/day: 0.00    Average packs/day: 0.3 packs/day for 10.0 years (2.5 ttl pk-yrs)    Types: Cigarettes    Quit date: 11/22/2019    Years since quitting: 3.8   Smokeless tobacco: Never   Tobacco comments:    quit 2021, smoked for 15 year about a pack a day  Vaping Use   Vaping status: Never Used  Substance and Sexual Activity   Alcohol use: No   Drug use: No   Sexual activity: Yes    Partners: Male    Birth control/protection: None  Other Topics Concern   Not on file  Social History Narrative   Lives with mother.   Have two kids girls: 75 and 36  year old.    Social Drivers of Corporate investment banker Strain: Low Risk  (09/01/2023)   Overall Financial Resource Strain (CARDIA)    Difficulty of Paying Living Expenses: Not hard at all  Food Insecurity: No Food Insecurity (09/01/2023)   Hunger Vital Sign    Worried About Running Out of Food in the Last Year: Never true    Ran Out of Food in the Last Year: Never true  Transportation Needs: No Transportation Needs (09/01/2023)   PRAPARE - Administrator, Civil Service (Medical): No    Lack of Transportation (Non-Medical): No  Physical Activity: Sufficiently Active (09/01/2023)   Exercise Vital Sign    Days of Exercise per Week: 6 days    Minutes of Exercise per Session: 40 min  Stress: No Stress Concern Present (09/01/2023)   [employer] of Occupational Health - Occupational Stress Questionnaire    Feeling of Stress : Only a little  Social Connections: Socially Isolated (09/01/2023)   Social Connection and Isolation Panel [NHANES]    Frequency of Communication with Friends and Family: More than three times a week    Frequency of Social Gatherings with Friends and Family: Never    Attends Religious Services: Never    Database administrator or  Organizations: No    Attends Engineer, structural: Not on file    Marital Status: Never married  Intimate Partner Violence: Not At Risk (04/04/2022)   Humiliation, Afraid, Rape, and Kick questionnaire    Fear of Current or Ex-Partner: No    Emotionally Abused: No    Physically Abused: No    Sexually Abused: No    ROS Negative unless indicated in HPI.      Objective    BP 128/82  Pulse 78   Temp 98 F (36.7 C)   Ht 5\' 3"  (1.6 m)   Wt 252 lb 12.8 oz (114.7 kg)   LMP 07/19/2023   SpO2 98%   BMI 44.78 kg/m   Physical Exam Constitutional:      Appearance: Normal appearance.  HENT:     Right Ear: Tympanic membrane normal.     Left Ear: Tympanic membrane normal.     Mouth/Throat:     Mouth: Mucous membranes are moist.  Eyes:     Conjunctiva/sclera: Conjunctivae normal.     Pupils: Pupils are equal, round, and reactive to light.  Cardiovascular:     Rate and Rhythm: Normal rate and regular rhythm.     Pulses: Normal pulses.     Heart sounds: Normal heart sounds.  Pulmonary:     Effort: Pulmonary effort is normal.     Breath sounds: Normal breath sounds.  Abdominal:     General: Bowel sounds are normal.     Palpations: Abdomen is soft.  Musculoskeletal:     Cervical back: Normal range of motion. No tenderness.  Skin:    General: Skin is warm.     Findings: No bruising.  Neurological:     General: No focal deficit present.     Mental Status: She is alert and oriented to person, place, and time. Mental status is at baseline.  Psychiatric:        Mood and Affect: Mood normal.        Behavior: Behavior normal.        Thought Content: Thought content normal.        Judgment: Judgment normal.         Assessment & Plan:  Vitamin D deficiency Assessment & Plan: Patient reports previous diagnosis of Vitamin D deficiency. -Order Vitamin D level.  Orders: -     VITAMIN D 25 Hydroxy (Vit-D Deficiency, Fractures)  Other fatigue -     TSH; Future -      B12 and Folate Panel; Future  Hyperlipidemia, unspecified hyperlipidemia type Assessment & Plan: Patient reports high cholesterol in the past. -Order fasting lipid panel.  Orders: -     CBC with Differential/Platelet; Future -     Comprehensive metabolic panel; Future -     Lipid panel; Future  Sleep disturbance Assessment & Plan: Patient reports snoring and has had a previous sleep study. No results available from previous study. -Will obtain previous sleep study results from [facility].   Morbid obesity (HCC) Assessment & Plan: Patient expressed concern about weight and interest in weight loss medication. Discussed the importance of diet and exercise. Family history of diabetes noted. -Refer to weight management clinic. -Provide information on Mediterranean diet.  Orders: -     Hemoglobin A1c; Future -     Amb Ref to Medical Weight Management  History of gestational diabetes Assessment & Plan: Patient reports history of PCOS but regular menstrual cycles since last childbirth. -Followed by OB/GYN  Orders: -     Hemoglobin A1c; Future    No follow-ups on file.   Kara Dies, NP

## 2023-09-01 NOTE — Patient Instructions (Signed)
Mediterranean Diet A Mediterranean diet is based on the traditions of countries on the [employer]. It focuses on eating more: Fruits and vegetables. Whole grains, beans, nuts, and seeds. Heart-healthy fats. These are fats that are good for your heart. It involves eating less: Dairy. Meat and eggs. Processed foods with added sugar, salt, and fat. This type of diet can help prevent certain conditions. It can also improve outcomes if you have a long-term (chronic) disease, such as kidney or heart disease. What are tips for following this plan? Reading food labels Check packaged foods for: The serving size. For foods such as rice and pasta, the serving size is the amount of cooked product, not dry. The total fat. Avoid foods with saturated fat or trans fat. Added sugars, such as corn syrup. Shopping  Try to have a balanced diet. Buy a variety of foods, such as: Fresh fruits and vegetables. You may be able to get these from local farmers markets. You can also buy them frozen. Grains, beans, nuts, and seeds. Some of these can be bought in bulk. Fresh seafood. Poultry and eggs. Low-fat dairy products. Buy whole ingredients instead of foods that have already been packaged. If you can't get fresh seafood, buy precooked frozen shrimp or canned fish, such as tuna, salmon, or sardines. Stock your pantry so you always have certain foods on hand, such as olive oil, canned tuna, canned tomatoes, rice, pasta, and beans. Cooking Cook foods with extra-virgin olive oil instead of using butter or other vegetable oils. Have meat as a side dish. Have vegetables or grains as your main dish. This means having meat in small portions or adding small amounts of meat to foods like pasta or stew. Use beans or vegetables instead of meat in common dishes like chili or lasagna. Try out different cooking methods. Try roasting, broiling, steaming, and sauting vegetables. Add frozen vegetables to soups, stews,  pasta, or rice. Add nuts or seeds for added healthy fats and plant protein at each meal. You can add these to yogurt, salads, or vegetable dishes. Marinate fish or vegetables using olive oil, lemon juice, garlic, and fresh herbs. Meal planning Plan to eat a vegetarian meal one day each week. Try to work up to two vegetarian meals, if possible. Eat seafood two or more times a week. Have healthy snacks on hand. These may include: Vegetable sticks with hummus. Greek yogurt. Fruit and nut trail mix. Eat balanced meals. These should include: Fruit: 2-3 servings a day. Vegetables: 4-5 servings a day. Low-fat dairy: 2 servings a day. Fish, poultry, or lean meat: 1 serving a day. Beans and legumes: 2 or more servings a week. Nuts and seeds: 1-2 servings a day. Whole grains: 6-8 servings a day. Extra-virgin olive oil: 3-4 servings a day. Limit red meat and sweets to just a few servings a month. Lifestyle  Try to cook and eat meals with your family. Drink enough fluid to keep your pee (urine) pale yellow. Be active every day. This includes: Aerobic exercise, which is exercise that causes your heart to beat faster. Examples include running and swimming. Leisure activities like gardening, walking, or housework. Get 7-8 hours of sleep each night. Drink red wine if your provider says you can. A glass of wine is 5 oz (150 mL). You may be allowed to have: Up to 1 glass a day if you're female and not pregnant. Up to 2 glasses a day if you're female. What foods should I eat? Fruits Apples. Apricots. Avocado.  Berries. Bananas. Cherries. Dates. Figs. Grapes. Lemons. Melon. Oranges. Peaches. Plums. Pomegranate. Vegetables Artichokes. Beets. Broccoli. Cabbage. Carrots. Eggplant. Green beans. Chard. Kale. Spinach. Onions. Leeks. Peas. Squash. Tomatoes. Peppers. Radishes. Grains Whole-grain pasta. Brown rice. Bulgur wheat. Polenta. Couscous. Whole-wheat bread. Orpah Cobb. Meats and other  proteins Beans. Almonds. Sunflower seeds. Pine nuts. Peanuts. Cod. Salmon. Scallops. Shrimp. Tuna. Tilapia. Clams. Oysters. Eggs. Chicken or [place] without skin. Dairy Low-fat milk. Cheese. Greek yogurt. Fats and oils Extra-virgin olive oil. Avocado oil. Grapeseed oil. Beverages Water. Red wine. Herbal tea. Sweets and desserts Greek yogurt with honey. Baked apples. Poached pears. Trail mix. Seasonings and condiments Basil. Cilantro. Coriander. Cumin. Mint. Parsley. Sage. Rosemary. Tarragon. Garlic. Oregano. Thyme. Pepper. Balsamic vinegar. Tahini. Hummus. Tomato sauce. Olives. Mushrooms. The items listed above may not be all the foods and drinks you can have. Talk to a dietitian to learn more. What foods should I limit? This is a list of foods that should be eaten rarely. Fruits Fruit canned in syrup. Vegetables Deep-fried potatoes, like [place] fries. Grains Packaged pasta or rice dishes. Cereal with added sugar. Snacks with added sugar. Meats and other proteins Beef. Pork. Lamb. Chicken or [place] with skin. Hot dogs. Tomasa Blase. Dairy Ice cream. Sour cream. Whole milk. Fats and oils Butter. Canola oil. Vegetable oil. Beef fat (tallow). Lard. Beverages Juice. Sugar-sweetened soft drinks. Beer. Liquor and spirits. Sweets and desserts Cookies. Cakes. Pies. Candy. Seasonings and condiments Mayonnaise. Pre-made sauces and marinades. The items listed above may not be all the foods and drinks you should limit. Talk to a dietitian to learn more. Where to find more information American Heart Association (AHA): heart.org This information is not intended to replace advice given to you by your health care provider. Make sure you discuss any questions you have with your health care provider. Document Revised: 10/16/2022 Document Reviewed: 10/16/2022 Elsevier Patient Education  2024 [employer].

## 2023-09-04 ENCOUNTER — Encounter: Payer: Self-pay | Admitting: Obstetrics & Gynecology

## 2023-09-04 ENCOUNTER — Ambulatory Visit (INDEPENDENT_AMBULATORY_CARE_PROVIDER_SITE_OTHER): Payer: BC Managed Care – PPO | Admitting: Obstetrics & Gynecology

## 2023-09-04 ENCOUNTER — Other Ambulatory Visit (HOSPITAL_COMMUNITY)
Admission: RE | Admit: 2023-09-04 | Discharge: 2023-09-04 | Disposition: A | Discharge status: Home / Self Care | Payer: BC Managed Care – PPO | Source: Ambulatory Visit | Attending: Obstetrics & Gynecology | Admitting: Obstetrics & Gynecology

## 2023-09-04 ENCOUNTER — Other Ambulatory Visit: Payer: BC Managed Care – PPO

## 2023-09-04 VITALS — BP 164/96 | HR 79 | Ht 63.0 in | Wt 255.3 lb

## 2023-09-04 DIAGNOSIS — Z113 Encounter for screening for infections with a predominantly sexual mode of transmission: Secondary | ICD-10-CM | POA: Diagnosis not present

## 2023-09-04 DIAGNOSIS — Z01419 Encounter for gynecological examination (general) (routine) without abnormal findings: Secondary | ICD-10-CM | POA: Diagnosis not present

## 2023-09-04 DIAGNOSIS — N898 Other specified noninflammatory disorders of vagina: Secondary | ICD-10-CM

## 2023-09-04 DIAGNOSIS — N760 Acute vaginitis: Secondary | ICD-10-CM | POA: Diagnosis not present

## 2023-09-04 DIAGNOSIS — Z124 Encounter for screening for malignant neoplasm of cervix: Secondary | ICD-10-CM

## 2023-09-04 DIAGNOSIS — B9689 Other specified bacterial agents as the cause of diseases classified elsewhere: Secondary | ICD-10-CM | POA: Insufficient documentation

## 2023-09-04 NOTE — Progress Notes (Signed)
GYNECOLOGY ANNUAL PHYSICAL EXAM PROGRESS NOTE  Subjective:    Susan Hubbard is a 33 y.o. single [id number] female who presents for an annual exam.  The patient is sexually active. She has a non-monogamous "friend". The patient participates in regular exercise: no. Has the patient ever been transfused or tattooed?: yes. The patient reports that there is not domestic violence in her life.   The patient has the following complaints today: She needs a pap smear and would like STI testing  Menstrual History: Patient's last menstrual period was 08/27/2023.     Gynecologic History:  Contraception: condoms History of STI's:  Last Pap: 2019. Results were: normal.  smears.    FH- no breast, gyn, colon cancers    OB History  Gravida Para Term Preterm AB Living  5 1 1  0 3 1  SAB IAB Ectopic Multiple Live Births  1 2 0 0 1    # Outcome Date GA Lbr Len/2nd Weight Sex Type Anes PTL Lv  5 Gravida           4 SAB 2021          3 IAB 2017          2 Term 05/01/14 [redacted]w[redacted]d   F CS-Unspec     1 IAB 2009            Past Medical History:  Diagnosis Date   Anemia    Bacterial vaginosis 11/11/2015   History of   Blood transfusion without reported diagnosis 9/23   Chlamydia    Chronic kidney disease    h/o kidney stones   Family history of adverse reaction to anesthesia    mom unsure of what   GERD (gastroesophageal reflux disease)    Gestational diabetes    Hyperlipidemia    Irregular menses    Oligomenorrhea    PCOS (polycystic ovarian syndrome)    PCOS (polycystic ovarian syndrome)    Vaccine for human papilloma virus (HPV) types 6, 11, 16, and 18 administered    Vitamin D deficiency    Vitamin D deficiency     Past Surgical History:  Procedure Laterality Date   CESAREAN SECTION     CESAREAN SECTION N/A 02/06/2021   Procedure: CESAREAN SECTION;  Surgeon: Hermina Staggers, MD;  Location: [facility];  Service: Obstetrics;  Laterality: N/A;   HYSTEROSCOPY WITH D & C N/A  10/20/2015   Procedure: DILATATION AND CURETTAGE /HYSTEROSCOPY;  Surgeon: Vena Austria, MD;  Location: [facility] ORS;  Service: Gynecology;  Laterality: N/A;    Family History  Problem Relation Age of Onset   Diabetes Mother        type II   Hyperlipidemia Mother    Hypertension Mother    GER disease Mother    Sleep apnea Mother    Asthma Mother    COPD Mother    Obesity Mother    Hypertension Father    Stroke Maternal Uncle    Arthritis Maternal Grandmother    Hyperlipidemia Maternal Grandmother    Diabetes Maternal Grandmother        Type II   Asthma Maternal Grandmother    Obesity Maternal Grandmother    Cancer Maternal Grandfather        Lung cancer   Hypertension Paternal Grandmother    Brain cancer Paternal Grandmother    Lung cancer Paternal Grandmother    Cancer Paternal Grandmother    Cancer Paternal Grandfather    Diabetes Paternal Grandfather  Hyperlipidemia Paternal Grandfather    Cataracts Paternal Grandfather     Social History   Socioeconomic History   Marital status: Significant Other    Spouse name: Not on file   Number of children: Not on file   Years of education: Not on file   Highest education level: Associate degree: academic program  Occupational History   Not on file  Tobacco Use   Smoking status: Former    Current packs/day: 0.00    Average packs/day: 0.3 packs/day for 10.0 years (2.5 ttl pk-yrs)    Types: Cigarettes    Quit date: 11/22/2019    Years since quitting: 3.7   Smokeless tobacco: Never   Tobacco comments:    quit 2021, smoked for 15 year about a pack a day  Vaping Use   Vaping status: Never Used  Substance and Sexual Activity   Alcohol use: No   Drug use: No   Sexual activity: Yes    Partners: Male    Birth control/protection: None  Other Topics Concern   Not on file  Social History Narrative   Lives with mother.   Have two kids girls: 40 and 57  year old.    Social Drivers of Corporate investment banker Strain: Low  Risk  (09/01/2023)   Overall Financial Resource Strain (CARDIA)    Difficulty of Paying Living Expenses: Not hard at all  Food Insecurity: No Food Insecurity (09/01/2023)   Hunger Vital Sign    Worried About Running Out of Food in the Last Year: Never true    Ran Out of Food in the Last Year: Never true  Transportation Needs: No Transportation Needs (09/01/2023)   PRAPARE - Administrator, Civil Service (Medical): No    Lack of Transportation (Non-Medical): No  Physical Activity: Sufficiently Active (09/01/2023)   Exercise Vital Sign    Days of Exercise per Week: 6 days    Minutes of Exercise per Session: 40 min  Stress: No Stress Concern Present (09/01/2023)   [employer] of Occupational Health - Occupational Stress Questionnaire    Feeling of Stress : Only a little  Social Connections: Socially Isolated (09/01/2023)   Social Connection and Isolation Panel [NHANES]    Frequency of Communication with Friends and Family: More than three times a week    Frequency of Social Gatherings with Friends and Family: Never    Attends Religious Services: Never    Database administrator or Organizations: No    Attends Engineer, structural: Not on file    Marital Status: Never married  Intimate Partner Violence: Not At Risk (04/04/2022)   Humiliation, Afraid, Rape, and Kick questionnaire    Fear of Current or Ex-Partner: No    Emotionally Abused: No    Physically Abused: No    Sexually Abused: No    No current outpatient medications on file prior to visit.   No current facility-administered medications on file prior to visit.    Allergies  Allergen Reactions   Augmentin [Amoxicillin-Pot Clavulanate] Nausea And Vomiting    Has patient had a PCN reaction causing immediate rash, facial/tongue/throat swelling, SOB or lightheadedness with hypotension: no Has patient had a PCN reaction causing severe rash involving mucus membranes or skin necrosis: unknown Has patient had a  PCN reaction that required hospitalization no Has patient had a PCN reaction occurring within the last 10 years: yes If all of the above answers are "NO", then may proceed with Cephalosporin use.  Review of Systems Constitutional: negative for chills, fatigue, fevers and sweats Eyes: negative for irritation, redness and visual disturbance Ears, nose, mouth, throat, and face: negative for hearing loss, nasal congestion, snoring and tinnitus Respiratory: negative for asthma, cough, sputum Cardiovascular: negative for chest pain, dyspnea, exertional chest pressure/discomfort, irregular heart beat, palpitations and syncope Gastrointestinal: negative for abdominal pain, change in bowel habits, nausea and vomiting Genitourinary: negative for abnormal menstrual periods, genital lesions, sexual problems and vaginal discharge, dysuria and urinary incontinence Integument/breast: negative for breast lump, breast tenderness and nipple discharge Hematologic/lymphatic: negative for bleeding and easy bruising Musculoskeletal:negative for back pain and muscle weakness Neurological: negative for dizziness, headaches, vertigo and weakness Endocrine: negative for diabetic symptoms including polydipsia, polyuria and skin dryness Allergic/Immunologic: negative for hay fever and urticaria      Objective:  Blood pressure (!) 142/97, pulse 93, height 5\' 3"  (1.6 m), weight 255 lb 4.8 oz (115.8 kg), last menstrual period 08/27/2023. Body mass index is 45.22 kg/m.    General Appearance:    Alert, cooperative, no distress, appears stated age  Head:    Normocephalic, without obvious abnormality, atraumatic  Eyes:    PERRL, conjunctiva/corneas clear, EOM's intact, both eyes  Ears:    Normal external ear canals, both ears  Nose:   Nares normal, septum midline, mucosa normal, no drainage or sinus tenderness  Throat:   Lips, mucosa, and tongue normal; teeth and gums normal  Neck:   Supple, symmetrical, trachea  midline, no adenopathy; thyroid: no enlargement/tenderness/nodules; no carotid bruit or JVD  Back:     Symmetric, no curvature, ROM normal, no CVA tenderness  Lungs:     Clear to auscultation bilaterally, respirations unlabored  Chest Wall:    No tenderness or deformity   Heart:    Regular rate and rhythm, S1 and S2 normal, no murmur, rub or gallop  Breast Exam:    No tenderness, masses, or nipple abnormality  Abdomen:     Soft, non-tender, bowel sounds active all four quadrants, no masses, no organomegaly.    Genitalia:    Pelvic:external genitalia normal, vagina without lesions or tenderness, rectovaginal septum  normal.  White curdy vaginal dischargeCervix normal in appearance, no cervical motion tenderness, no adnexal masses or tenderness.  Uterus normal size, shape, mobile, regular contours, nontender.  Acanthosis nigricans noted on inner thighs  Rash under her panus c/w chronic yeast   Rectal:    Normal external sphincter.  No hemorrhoids appreciated. Internal exam not done.   Extremities:   Extremities normal, atraumatic, no cyanosis or edema  Pulses:   2+ and symmetric all extremities  Skin:   Skin color, texture, turgor normal, no rashes or lesions  Lymph nodes:   Cervical, supraclavicular, and axillary nodes normal  Neurologic:   CNII-XII intact, normal strength, sensation and reflexes throughout   .  Labs:  Lab Results  Component Value Date   WBC 6.4 04/12/2022   HGB 10.4 (L) 04/12/2022   HCT 30.8 (L) 04/12/2022   MCV 81 04/12/2022   PLT 454 (H) 04/12/2022    Lab Results  Component Value Date   CREATININE 0.84 04/03/2022   BUN 11 04/03/2022   NA 135 04/03/2022   K 4.2 04/03/2022   CL 104 04/03/2022   CO2 23 04/03/2022    Lab Results  Component Value Date   ALT 17 04/03/2022   AST 25 04/03/2022   ALKPHOS 68 04/03/2022   BILITOT 1.1 04/03/2022     Assessment:   1. Well  woman exam with routine gynecological exam   2. Routine screening for STI (sexually  transmitted infection)   3. Screening for cervical cancer      Plan:   Breast self exam technique reviewed and patient encouraged to perform self-exam monthly. Contraception: condoms. Discussed healthy lifestyle modifications. She will be getting her fasting labs tomorrow for her primary care provider. Pap smear ordered. Rec OTC yeast medicine for under her panus Aptima testing done STI testing done Follow up in 1 year for annual exam   Allie Bossier, MD [facility]

## 2023-09-05 LAB — HEPATITIS B SURFACE ANTIGEN: Hepatitis B Surface Ag: NEGATIVE

## 2023-09-05 LAB — CERVICOVAGINAL ANCILLARY ONLY
Bacterial Vaginitis (gardnerella): POSITIVE — AB
Candida Glabrata: NEGATIVE
Candida Vaginitis: NEGATIVE
Chlamydia: NEGATIVE
Comment: NEGATIVE
Comment: NEGATIVE
Comment: NEGATIVE
Comment: NEGATIVE
Comment: NEGATIVE
Comment: NORMAL
Neisseria Gonorrhea: NEGATIVE
Trichomonas: NEGATIVE

## 2023-09-05 LAB — HIV ANTIBODY (ROUTINE TESTING W REFLEX): HIV Screen 4th Generation wRfx: NONREACTIVE

## 2023-09-05 LAB — RPR: RPR Ser Ql: NONREACTIVE

## 2023-09-05 LAB — HEPATITIS C ANTIBODY: Hep C Virus Ab: NONREACTIVE

## 2023-09-06 ENCOUNTER — Ambulatory Visit: Payer: Federal, State, Local not specified - PPO | Admitting: Podiatry

## 2023-09-09 DIAGNOSIS — R5383 Other fatigue: Secondary | ICD-10-CM | POA: Insufficient documentation

## 2023-09-09 DIAGNOSIS — E785 Hyperlipidemia, unspecified: Secondary | ICD-10-CM | POA: Insufficient documentation

## 2023-09-09 NOTE — Assessment & Plan Note (Signed)
 Patient reports previous diagnosis of Vitamin D deficiency. -Order Vitamin D level.

## 2023-09-09 NOTE — Assessment & Plan Note (Signed)
 Patient reports history of PCOS but regular menstrual cycles since last childbirth. -Followed by OB/GYN

## 2023-09-09 NOTE — Assessment & Plan Note (Signed)
 Patient reports high cholesterol in the past. -Order fasting lipid panel.

## 2023-09-09 NOTE — Assessment & Plan Note (Signed)
 Patient reports snoring and has had a previous sleep study. No results available from previous study. -Will obtain previous sleep study results from [facility].

## 2023-09-09 NOTE — Assessment & Plan Note (Signed)
 Patient expressed concern about weight and interest in weight loss medication. Discussed the importance of diet and exercise. Family history of diabetes noted. -Refer to weight management clinic. -Provide information on Mediterranean diet.

## 2023-09-11 ENCOUNTER — Encounter: Payer: Self-pay | Admitting: Obstetrics & Gynecology

## 2023-09-11 ENCOUNTER — Other Ambulatory Visit: Payer: Self-pay | Admitting: Obstetrics & Gynecology

## 2023-09-11 DIAGNOSIS — B9689 Other specified bacterial agents as the cause of diseases classified elsewhere: Secondary | ICD-10-CM

## 2023-09-11 MED ORDER — METRONIDAZOLE 500 MG PO TABS: 500.0000 mg | ORAL_TABLET | Freq: Two times a day (BID) | ORAL | 0 refills | Status: AC

## 2023-09-11 NOTE — Progress Notes (Signed)
Flagyl prescribed for bv 

## 2023-09-13 LAB — CYTOLOGY - PAP
Comment: NEGATIVE
Comment: NEGATIVE
Diagnosis: NEGATIVE
HSV1: NEGATIVE
HSV2: NEGATIVE
High risk HPV: NEGATIVE

## 2023-09-19 ENCOUNTER — Other Ambulatory Visit

## 2023-10-09 DIAGNOSIS — L732 Hidradenitis suppurativa: Secondary | ICD-10-CM | POA: Diagnosis not present

## 2023-10-19 ENCOUNTER — Ambulatory Visit: Payer: BC Managed Care – PPO | Admitting: Dietician

## 2023-11-07 DIAGNOSIS — H35033 Hypertensive retinopathy, bilateral: Secondary | ICD-10-CM | POA: Diagnosis not present

## 2023-12-26 ENCOUNTER — Ambulatory Visit: Admitting: Podiatry

## 2024-01-17 ENCOUNTER — Ambulatory Visit: Payer: Self-pay

## 2024-01-17 NOTE — Telephone Encounter (Signed)
 FYI Only or Action Required?: FYI only for provider.  Patient was last seen in primary care on 09/01/2023 by Vincente Saber, NP. Called Nurse Triage reporting Chest Pain. Symptoms began about a month ago. Interventions attempted: OTC medications: Tums. Symptoms are: unchanged.  Triage Disposition: Call PCP Within 24 Hours  Patient/caregiver understands and will follow disposition?: Yes  Appt. Scheduled for 7/14**               Copied from CRM #[id number]. Topic: Clinical - Red Word Triage >> Jan 17, 2024  3:57 PM Rosina D wrote: Reason for RMF:almwpwh in her chest for awhile but now it is constant. Patient states it happens anytime of the day Reason for Disposition  [1] Patient says chest pain feels exactly the same as previously diagnosed heartburn AND [2] describes burning in chest AND [3] accompanying sour taste in mouth  Answer Assessment - Initial Assessment Questions 1. LOCATION: Where does it hurt?       Middle of chest burn   2. RADIATION: Does the pain go anywhere else? (e.g., into neck, jaw, arms, back)     No   3. ONSET: When did the chest pain begin? (Minutes, hours or days)      X 1 month   4. PATTERN: Does the pain come and go, or has it been constant since it started?  Does it get worse with exertion?       Intermittent, ow becoming more frequent.   5. DURATION: How long does it last (e.g., seconds, minutes, hours)     1 hour  6. SEVERITY: How bad is the pain?  (e.g., Scale 1-10; mild, moderate, or severe)    - MILD (1-3): doesn't interfere with normal activities     - MODERATE (4-7): interferes with normal activities or awakens from sleep    - SEVERE (8-10): excruciating pain, unable to do any normal activities        Mild    7. CARDIAC RISK FACTORS: Do you have any history of heart problems or risk factors for heart disease? (e.g., angina, prior heart attack; diabetes, high blood pressure, high cholesterol, smoker, or strong family  history of heart disease)     High cholesterol   8. PULMONARY RISK FACTORS: Do you have any history of lung disease?  (e.g., blood clots in lung, asthma, emphysema, birth control pills)     No   9. CAUSE: What do you think is causing the chest pain?     Heartburn  10. OTHER SYMPTOMS: Do you have any other symptoms? (e.g., dizziness, nausea, vomiting, sweating, fever, difficulty breathing, cough)       No   11. PREGNANCY: Is there any chance you are pregnant? When was your last menstrual period?       No  Protocols used: Chest Pain-A-AH

## 2024-01-18 ENCOUNTER — Encounter: Payer: Self-pay | Admitting: Nurse Practitioner

## 2024-01-18 ENCOUNTER — Ambulatory Visit: Admitting: Nurse Practitioner

## 2024-01-18 VITALS — BP 130/84 | HR 105 | Temp 98.4°F | Ht 63.0 in | Wt 263.0 lb

## 2024-01-18 DIAGNOSIS — E559 Vitamin D deficiency, unspecified: Secondary | ICD-10-CM

## 2024-01-18 DIAGNOSIS — Z8632 Personal history of gestational diabetes: Secondary | ICD-10-CM | POA: Diagnosis not present

## 2024-01-18 DIAGNOSIS — R1013 Epigastric pain: Secondary | ICD-10-CM | POA: Diagnosis not present

## 2024-01-18 DIAGNOSIS — R5383 Other fatigue: Secondary | ICD-10-CM | POA: Diagnosis not present

## 2024-01-18 DIAGNOSIS — E785 Hyperlipidemia, unspecified: Secondary | ICD-10-CM | POA: Diagnosis not present

## 2024-01-18 MED ORDER — OMEPRAZOLE 20 MG PO CPDR
DELAYED_RELEASE_CAPSULE | ORAL | 0 refills | Status: DC
Start: 2024-01-18 — End: 2024-02-21

## 2024-01-18 NOTE — Telephone Encounter (Signed)
 FYI

## 2024-01-18 NOTE — Patient Instructions (Signed)
 You came in today because of a burning chest pain that you've been experiencing for about a month. We discussed potential causes, including GERD and cardiac issues, and have made a plan to address these concerns.  YOUR PLAN:  CHEST PAIN: You have been experiencing intermittent chest pain with shortness of breath and sweating, which is worsened by certain foods. An EKG showed nonspecific changes, and we need to rule out both GERD and cardiac issues. -You are prescribed omeprazole to help with potential GERD. -You refused to go to emergency department at present. -You will be referred to a cardiologist for further evaluation. -If your symptoms worsen or occur suddenly, please go to the emergency department immediately.

## 2024-01-18 NOTE — Telephone Encounter (Signed)
 Patient is coming in at 2 today.Currently experiencing no pain in chest, But patient advised sometimes when she is at work or at night in bed comes all of a sudden lasting 30 minutes to an hour, starts to  sweat and feels clammy to touch.Denies any nausea and never had  regurgitation with these events. Patient says she has tried avoiding spicy foods incase it is heartburn but no improvement even trying Tums and baking soda with water , and tried drinking milk does not help.Started a month ago but now is more frequent and lasting longer.

## 2024-01-18 NOTE — Progress Notes (Signed)
 Established Patient Office Visit  Subjective:  Patient ID: Susan Hubbard, female    DOB: [birth year]  Age: 33 y.o. MRN: [id number]  CC:  Chief Complaint  Patient presents with   Acute Visit    Chest pain x 1 month   Discussed the use of a AI scribe software for clinical note transcription with the patient, who gave verbal consent to proceed.  HPI Susan Hubbard is a 33 year old female who presents with burning epigastric pain.  She experiences a burning sensation in her chest for about a month, occurring two to three times a week, lasting from five minutes to an hour. The pain is burning, non-radiating, and can occur at any time, including at night, waking her with severe burning. Certain foods, such as those with red sauce, pepperonis, and citrus drinks like orange juice, trigger episodes. Despite avoiding these foods, symptoms persist. Tums, baking soda and water, milk, and water have not provided relief.  She sometimes feels cold and clammy, particularly at work, due to the intensity of the burning. The pain interferes with her ability to work, requiring her to stop and rest. She experiences shortness of breath during severe episodes. She denies heart palpitations, constipation, and leg swelling. Bowel movements are regular.  She recalls similar heartburn symptoms during pregnancy, though less severe. No GERD or reflux diagnosis outside of pregnancy.  She does not consume coffee but occasionally drinks soda. Orange juice is a known trigger.  She had nonspecific precordial chest pain 2023 and was evaluated by cardiology.  Echocardiogram was normal.  Coronary CTA was scheduled but not completed.  She denies any epigastric pain or chest pain at present.  The last episode occurred this morning when she was at work and lasted for 3-5 minutes.  She noted improvement after drinking water.  HPI   Past Medical History:  Diagnosis Date   Anemia    Bacterial vaginosis 11/11/2015    History of   Blood transfusion without reported diagnosis 9/23   Chlamydia    Chronic kidney disease    h/o kidney stones   Family history of adverse reaction to anesthesia    mom unsure of what   GERD (gastroesophageal reflux disease)    Gestational diabetes    Hyperlipidemia    Irregular menses    Oligomenorrhea    PCOS (polycystic ovarian syndrome)    PCOS (polycystic ovarian syndrome)    Vaccine for human papilloma virus (HPV) types 6, 11, 16, and 18 administered    Vitamin D deficiency    Vitamin D deficiency     Past Surgical History:  Procedure Laterality Date   CESAREAN SECTION     CESAREAN SECTION N/A 02/06/2021   Procedure: CESAREAN SECTION;  Surgeon: Lorence Ozell CROME, MD;  Location: [facility];  Service: Obstetrics;  Laterality: N/A;   HYSTEROSCOPY WITH D & C N/A 10/20/2015   Procedure: DILATATION AND CURETTAGE /HYSTEROSCOPY;  Surgeon: Glory High, MD;  Location: [facility] ORS;  Service: Gynecology;  Laterality: N/A;    Family History  Problem Relation Age of Onset   Diabetes Mother        type II   Hyperlipidemia Mother    Hypertension Mother    GER disease Mother    Sleep apnea Mother    Asthma Mother    COPD Mother    Obesity Mother    Hypertension Father    Stroke Maternal Uncle    Arthritis Maternal Grandmother    Hyperlipidemia  Maternal Grandmother    Diabetes Maternal Grandmother        Type II   Asthma Maternal Grandmother    Obesity Maternal Grandmother    Cancer Maternal Grandfather        Lung cancer   Hypertension Paternal Grandmother    Brain cancer Paternal Grandmother    Lung cancer Paternal Grandmother    Cancer Paternal Grandmother    Cancer Paternal Grandfather    Diabetes Paternal Grandfather    Hyperlipidemia Paternal Grandfather    Cataracts Paternal Grandfather     Social History   Socioeconomic History   Marital status: Significant Other    Spouse name: Not on file   Number of children: Not on file   Years of education:  Not on file   Highest education level: Associate degree: academic program  Occupational History   Not on file  Tobacco Use   Smoking status: Former    Current packs/day: 0.00    Average packs/day: 0.3 packs/day for 10.0 years (2.5 ttl pk-yrs)    Types: Cigarettes    Quit date: 11/22/2019    Years since quitting: 4.1   Smokeless tobacco: Never   Tobacco comments:    quit 2021, smoked for 15 year about a pack a day  Vaping Use   Vaping status: Never Used  Substance and Sexual Activity   Alcohol use: No   Drug use: No   Sexual activity: Yes    Partners: Male    Birth control/protection: None  Other Topics Concern   Not on file  Social History Narrative   Lives with mother.   Have two kids girls: 4 and 14  year old.    Social Drivers of Corporate investment banker Strain: Low Risk  (09/01/2023)   Overall Financial Resource Strain (CARDIA)    Difficulty of Paying Living Expenses: Not hard at all  Food Insecurity: No Food Insecurity (09/01/2023)   Hunger Vital Sign    Worried About Running Out of Food in the Last Year: Never true    Ran Out of Food in the Last Year: Never true  Transportation Needs: No Transportation Needs (09/01/2023)   PRAPARE - Administrator, Civil Service (Medical): No    Lack of Transportation (Non-Medical): No  Physical Activity: Sufficiently Active (09/01/2023)   Exercise Vital Sign    Days of Exercise per Week: 6 days    Minutes of Exercise per Session: 40 min  Stress: No Stress Concern Present (09/01/2023)   [employer] of Occupational Health - Occupational Stress Questionnaire    Feeling of Stress : Only a little  Social Connections: Socially Isolated (09/01/2023)   Social Connection and Isolation Panel    Frequency of Communication with Friends and Family: More than three times a week    Frequency of Social Gatherings with Friends and Family: Never    Attends Religious Services: Never    Database administrator or Organizations: No     Attends Engineer, structural: Not on file    Marital Status: Never married  Intimate Partner Violence: Not At Risk (04/04/2022)   Humiliation, Afraid, Rape, and Kick questionnaire    Fear of Current or Ex-Partner: No    Emotionally Abused: No    Physically Abused: No    Sexually Abused: No     Outpatient Medications Prior to Visit  Medication Sig Dispense Refill   metroNIDAZOLE  (FLAGYL ) 500 MG tablet Take 1 tablet (500 mg total) by mouth 2 (  two) times daily. (Patient not taking: Reported on 01/18/2024) 14 tablet 0   No facility-administered medications prior to visit.    Allergies  Allergen Reactions   Augmentin [Amoxicillin-Pot Clavulanate] Nausea And Vomiting    Has patient had a PCN reaction causing immediate rash, facial/tongue/throat swelling, SOB or lightheadedness with hypotension: no Has patient had a PCN reaction causing severe rash involving mucus membranes or skin necrosis: unknown Has patient had a PCN reaction that required hospitalization no Has patient had a PCN reaction occurring within the last 10 years: yes If all of the above answers are NO, then may proceed with Cephalosporin use.     ROS Review of Systems Negative unless indicated in HPI.    Objective:    Physical Exam Constitutional:      Appearance: Normal appearance.  Cardiovascular:     Rate and Rhythm: Normal rate and regular rhythm.     Pulses: Normal pulses.     Heart sounds: Normal heart sounds.  Abdominal:     General: Bowel sounds are normal.     Palpations: Abdomen is soft.     Tenderness: There is no guarding.     Hernia: No hernia is present.  Musculoskeletal:     Cervical back: Normal range of motion.  Neurological:     General: No focal deficit present.     Mental Status: She is alert and oriented to person, place, and time.  Psychiatric:        Mood and Affect: Mood normal.        Behavior: Behavior normal.     BP 130/84   Pulse (!) 105   Temp 98.4 F (36.9 C)    Ht 5' 3 (1.6 m)   Wt 263 lb (119.3 kg)   SpO2 97%   BMI 46.59 kg/m  Wt Readings from Last 3 Encounters:  01/18/24 263 lb (119.3 kg)  09/04/23 255 lb 4.8 oz (115.8 kg)  09/01/23 252 lb 12.8 oz (114.7 kg)     Health Maintenance  Topic Date Due   Hepatitis B Vaccines (1 of 3 - 19+ 3-dose series) Never done   HPV VACCINES (1 - 3-dose SCDM series) Never done   DTaP/Tdap/Td (3 - Td or Tdap) 05/18/2021   COVID-19 Vaccine (1 - 2024-25 season) 02/03/2024 (Originally 03/19/2023)   INFLUENZA VACCINE  02/16/2024   Cervical Cancer Screening (HPV/Pap Cotest)  09/03/2028   Hepatitis C Screening  Completed   HIV Screening  Completed   Meningococcal B Vaccine  Aged Out       Topic Date Due   Hepatitis B Vaccines (1 of 3 - 19+ 3-dose series) Never done   HPV VACCINES (1 - 3-dose SCDM series) Never done    Lab Results  Component Value Date   TSH 1.170 01/18/2024   Lab Results  Component Value Date   WBC 8.5 01/18/2024   HGB 13.2 01/18/2024   HCT 41.4 01/18/2024   MCV 89 01/18/2024   PLT 363 01/18/2024   Lab Results  Component Value Date   NA 137 01/18/2024   K 4.3 01/18/2024   CO2 20 01/18/2024   GLUCOSE 162 (H) 01/18/2024   BUN 12 01/18/2024   CREATININE 0.68 01/18/2024   BILITOT <0.2 01/18/2024   ALKPHOS 97 01/18/2024   AST 15 01/18/2024   ALT 17 01/18/2024   PROT 6.6 01/18/2024   ALBUMIN 4.0 01/18/2024   CALCIUM 9.4 01/18/2024   ANIONGAP 8 04/03/2022   EGFR 118 01/18/2024   Lab Results  Component Value Date   CHOL 178 01/18/2024   Lab Results  Component Value Date   HDL 32 (L) 01/18/2024   Lab Results  Component Value Date   LDLCALC 93 01/18/2024   Lab Results  Component Value Date   TRIG 315 (H) 01/18/2024   Lab Results  Component Value Date   CHOLHDL 5.6 (H) 01/18/2024   Lab Results  Component Value Date   HGBA1C 6.3 (H) 01/18/2024      Assessment & Plan:  Epigastric pain Assessment & Plan: Intermittent chest pain with dyspnea and  diaphoresis, exacerbated by certain foods. EKG shows nonspecific changes compared to previous EKG. Differential includes GERD and cardiac issues.  -Immediate ED evaluation recommended, but patient declined. -Refer to cardiology for further evaluation and management.   -Started on omeprazole .  Advised patient to avoid trigger foods. -Red flag discussed.  Patient was provided clear instructions to go to ER or urgent care if symptoms does not improve, red flag or new problem develops.  Patient verbalized understanding.   Orders: -     EKG 12-Lead -     Ambulatory referral to Cardiology -     Lipase  History of gestational diabetes -     Hemoglobin A1c  Hyperlipidemia, unspecified hyperlipidemia type -     Comprehensive metabolic panel with GFR -     CBC with Differential/Platelet  Other fatigue -     B12 and Folate Panel -     TSH -     Lipid panel -     Comprehensive metabolic panel with GFR -     CBC with Differential/Platelet  Vitamin D deficiency -     B12 and Folate Panel -     Comprehensive metabolic panel with GFR -     CBC with Differential/Platelet  Other orders -     Omeprazole ; Take 1 tablet 30 minutes before the breakfast.  Dispense: 30 capsule; Refill: 0 -     Specimen status report    Follow-up: No follow-ups on file.   Birdia Jaycox, NP

## 2024-01-18 NOTE — Telephone Encounter (Signed)
 Noted! Thank you

## 2024-01-19 LAB — COMPREHENSIVE METABOLIC PANEL WITH GFR
ALT: 17 IU/L (ref 0–32)
AST: 15 IU/L (ref 0–40)
Albumin: 4 g/dL (ref 3.9–4.9)
Alkaline Phosphatase: 97 IU/L (ref 44–121)
BUN/Creatinine Ratio: 18 (ref 9–23)
BUN: 12 mg/dL (ref 6–20)
Bilirubin Total: <0.2 mg/dL (ref 0.0–1.2)
CO2: 20 mmol/L (ref 20–29)
Calcium: 9.4 mg/dL (ref 8.7–10.2)
Chloride: 101 mmol/L (ref 96–106)
Creatinine, Ser: 0.68 mg/dL (ref 0.57–1.00)
Globulin, Total: 2.6 g/dL (ref 1.5–4.5)
Glucose: 162 mg/dL — ABNORMAL HIGH (ref 70–99)
Potassium: 4.3 mmol/L (ref 3.5–5.2)
Sodium: 137 mmol/L (ref 134–144)
Total Protein: 6.6 g/dL (ref 6.0–8.5)
eGFR: 118 mL/min/1.73 (ref >59)

## 2024-01-19 LAB — CBC WITH DIFFERENTIAL/PLATELET
Basophils Absolute: 0 x10E3/uL (ref 0.0–0.2)
Basos: 0 %
EOS (ABSOLUTE): 0.1 x10E3/uL (ref 0.0–0.4)
Eos: 2 %
Hematocrit: 41.4 % (ref 34.0–46.6)
Hemoglobin: 13.2 g/dL (ref 11.1–15.9)
Immature Grans (Abs): 0 x10E3/uL (ref 0.0–0.1)
Immature Granulocytes: 0 %
Lymphocytes Absolute: 2.5 x10E3/uL (ref 0.7–3.1)
Lymphs: 29 %
MCH: 28.3 pg (ref 26.6–33.0)
MCHC: 31.9 g/dL (ref 31.5–35.7)
MCV: 89 fL (ref 79–97)
Monocytes Absolute: 0.5 x10E3/uL (ref 0.1–0.9)
Monocytes: 5 %
Neutrophils Absolute: 5.4 x10E3/uL (ref 1.4–7.0)
Neutrophils: 64 %
Platelets: 363 x10E3/uL (ref 150–450)
RBC: 4.66 x10E6/uL (ref 3.77–5.28)
RDW: 13.6 % (ref 11.7–15.4)
WBC: 8.5 x10E3/uL (ref 3.4–10.8)

## 2024-01-19 LAB — HEMOGLOBIN A1C
Est. average glucose Bld gHb Est-mCnc: 134 mg/dL
Hgb A1c MFr Bld: 6.3 % — ABNORMAL HIGH (ref 4.8–5.6)

## 2024-01-19 LAB — LIPID PANEL
Chol/HDL Ratio: 5.6 ratio — ABNORMAL HIGH (ref 0.0–4.4)
Cholesterol, Total: 178 mg/dL (ref 100–199)
HDL: 32 mg/dL — ABNORMAL LOW (ref >39)
LDL Chol Calc (NIH): 93 mg/dL (ref 0–99)
Triglycerides: 315 mg/dL — ABNORMAL HIGH (ref 0–149)
VLDL Cholesterol Cal: 53 mg/dL — ABNORMAL HIGH (ref 5–40)

## 2024-01-19 LAB — B12 AND FOLATE PANEL
Folate: 9.8 ng/mL (ref >3.0)
Vitamin B-12: 356 pg/mL (ref 232–1245)

## 2024-01-19 LAB — TSH: TSH: 1.17 u[IU]/mL (ref 0.450–4.500)

## 2024-01-21 LAB — LIPASE: Lipase: 28 U/L (ref 14–72)

## 2024-01-21 LAB — SPECIMEN STATUS REPORT

## 2024-01-22 DIAGNOSIS — L700 Acne vulgaris: Secondary | ICD-10-CM | POA: Diagnosis not present

## 2024-01-22 DIAGNOSIS — Z79899 Other long term (current) drug therapy: Secondary | ICD-10-CM | POA: Diagnosis not present

## 2024-01-22 DIAGNOSIS — L732 Hidradenitis suppurativa: Secondary | ICD-10-CM | POA: Diagnosis not present

## 2024-01-22 DIAGNOSIS — Z111 Encounter for screening for respiratory tuberculosis: Secondary | ICD-10-CM | POA: Diagnosis not present

## 2024-01-23 DIAGNOSIS — R1013 Epigastric pain: Secondary | ICD-10-CM | POA: Insufficient documentation

## 2024-01-23 NOTE — Assessment & Plan Note (Signed)
 Intermittent chest pain with dyspnea and diaphoresis, exacerbated by certain foods. EKG shows nonspecific changes compared to previous EKG. Differential includes GERD and cardiac issues.  -Immediate ED evaluation recommended, but patient declined. -Refer to cardiology for further evaluation and management.   -Started on omeprazole .  Advised patient to avoid trigger foods. -Red flag discussed.  Patient was provided clear instructions to go to ER or urgent care if symptoms does not improve, red flag or new problem develops.  Patient verbalized understanding.

## 2024-01-29 ENCOUNTER — Ambulatory Visit: Admitting: Internal Medicine

## 2024-02-13 ENCOUNTER — Other Ambulatory Visit: Payer: Self-pay | Admitting: Nurse Practitioner

## 2024-02-15 ENCOUNTER — Other Ambulatory Visit (INDEPENDENT_AMBULATORY_CARE_PROVIDER_SITE_OTHER)

## 2024-02-15 ENCOUNTER — Encounter: Payer: Self-pay | Admitting: Nurse Practitioner

## 2024-02-15 DIAGNOSIS — Z8632 Personal history of gestational diabetes: Secondary | ICD-10-CM | POA: Diagnosis not present

## 2024-02-15 DIAGNOSIS — E785 Hyperlipidemia, unspecified: Secondary | ICD-10-CM

## 2024-02-15 DIAGNOSIS — R5383 Other fatigue: Secondary | ICD-10-CM

## 2024-02-15 LAB — LIPID PANEL
Cholesterol: 173 mg/dL (ref 0–200)
HDL: 30 mg/dL — ABNORMAL LOW (ref >39.00)
LDL Cholesterol: 118 mg/dL — ABNORMAL HIGH (ref 0–99)
NonHDL: 143.18
Total CHOL/HDL Ratio: 6
Triglycerides: 127 mg/dL (ref 0.0–149.0)
VLDL: 25.4 mg/dL (ref 0.0–40.0)

## 2024-02-15 LAB — CBC WITH DIFFERENTIAL/PLATELET
Basophils Absolute: 0 K/uL (ref 0.0–0.1)
Basophils Relative: 0.2 % (ref 0.0–3.0)
Eosinophils Absolute: 0.2 K/uL (ref 0.0–0.7)
Eosinophils Relative: 2.8 % (ref 0.0–5.0)
HCT: 37.9 % (ref 36.0–46.0)
Hemoglobin: 12.6 g/dL (ref 12.0–15.0)
Lymphocytes Relative: 27.9 % (ref 12.0–46.0)
Lymphs Abs: 2.1 K/uL (ref 0.7–4.0)
MCHC: 33.3 g/dL (ref 30.0–36.0)
MCV: 83.9 fl (ref 78.0–100.0)
Monocytes Absolute: 0.5 K/uL (ref 0.1–1.0)
Monocytes Relative: 6.5 % (ref 3.0–12.0)
Neutro Abs: 4.7 K/uL (ref 1.4–7.7)
Neutrophils Relative %: 62.6 % (ref 43.0–77.0)
Platelets: 329 K/uL (ref 150.0–400.0)
RBC: 4.52 Mil/uL (ref 3.87–5.11)
RDW: 14 % (ref 11.5–15.5)
WBC: 7.5 K/uL (ref 4.0–10.5)

## 2024-02-15 LAB — COMPREHENSIVE METABOLIC PANEL WITH GFR
ALT: 18 U/L (ref 0–35)
AST: 12 U/L (ref 0–37)
Albumin: 4 g/dL (ref 3.5–5.2)
Alkaline Phosphatase: 80 U/L (ref 39–117)
BUN: 14 mg/dL (ref 6–23)
CO2: 28 meq/L (ref 19–32)
Calcium: 9.1 mg/dL (ref 8.4–10.5)
Chloride: 105 meq/L (ref 96–112)
Creatinine, Ser: 0.79 mg/dL (ref 0.40–1.20)
GFR: 97.9 mL/min (ref >60.00)
Glucose, Bld: 132 mg/dL — ABNORMAL HIGH (ref 70–99)
Potassium: 4.1 meq/L (ref 3.5–5.1)
Sodium: 140 meq/L (ref 135–145)
Total Bilirubin: 0.3 mg/dL (ref 0.2–1.2)
Total Protein: 7.1 g/dL (ref 6.0–8.3)

## 2024-02-15 LAB — TSH: TSH: 0.73 u[IU]/mL (ref 0.35–5.50)

## 2024-02-15 LAB — B12 AND FOLATE PANEL
Folate: 14.1 ng/mL (ref >5.9)
Vitamin B-12: 254 pg/mL (ref 211–911)

## 2024-02-15 LAB — HEMOGLOBIN A1C: Hgb A1c MFr Bld: 6.8 % — ABNORMAL HIGH (ref 4.6–6.5)

## 2024-02-20 NOTE — Procedures (Signed)
 SABRA

## 2024-02-22 ENCOUNTER — Ambulatory Visit: Payer: Self-pay | Admitting: Nurse Practitioner

## 2024-02-22 ENCOUNTER — Telehealth: Payer: Self-pay

## 2024-02-22 ENCOUNTER — Other Ambulatory Visit: Payer: Self-pay | Admitting: Nurse Practitioner

## 2024-02-22 DIAGNOSIS — E119 Type 2 diabetes mellitus without complications: Secondary | ICD-10-CM

## 2024-02-22 MED ORDER — TIRZEPATIDE 2.5 MG/0.5ML ~~LOC~~ SOAJ: 2.5000 mg | SUBCUTANEOUS | 1 refills | Status: AC

## 2024-02-22 NOTE — Telephone Encounter (Signed)
 Copied from CRM [id number]. Topic: Clinical - Lab/Test Results >> Feb 22, 2024 10:20 AM Laymon HERO wrote: Reason for CRM: Patient has not heard anything back from lab results, wanting to know if a nurse can contact her as soon as possible

## 2024-02-23 ENCOUNTER — Telehealth: Payer: Self-pay

## 2024-02-23 ENCOUNTER — Other Ambulatory Visit: Payer: Self-pay

## 2024-02-23 ENCOUNTER — Other Ambulatory Visit (HOSPITAL_COMMUNITY): Payer: Self-pay

## 2024-02-23 DIAGNOSIS — E119 Type 2 diabetes mellitus without complications: Secondary | ICD-10-CM

## 2024-02-23 MED ORDER — MOUNJARO 2.5 MG/0.5ML ~~LOC~~ SOAJ
2.5000 mg | SUBCUTANEOUS | 1 refills | Status: AC
  Filled 2024-02-23: qty 2, 28d supply, fill #0
  Filled 2024-06-17: qty 2, 28d supply, fill #1

## 2024-02-23 NOTE — Telephone Encounter (Signed)
 Copied from CRM [id number]. Topic: Clinical - Medication Question >> Feb 23, 2024 11:03 AM Burnard DEL wrote: Reason for CRM: Patient would like to know if  prescription for mounjaro  could be resent to [facility] where PA has already been submitted for?

## 2024-02-23 NOTE — Telephone Encounter (Signed)
 Mounjaro  was cancelled at [facility] and sent to [facility]  per Patient request.

## 2024-02-23 NOTE — Telephone Encounter (Signed)
 I see gestational diabetes and A1C is 6.8. I would need an ICD10 code to continue. Please advise Prior Authorization form/request asks a question that requires your assistance. Please see the question below and advise accordingly. The PA will not be submitted until the necessary information is received.

## 2024-02-23 NOTE — Telephone Encounter (Signed)
 Submitted PA over phone.    Received notification from [facility] that Prior Authorization for MOUNJARO  has been APPROVED from 02/23/24 to 02/22/25. Ran test claim, Copay is $200. This test claim was processed through [facility]- copay amounts may vary at other pharmacies due to pharmacy/plan contracts, or as the patient moves through the different stages of their insurance plan.

## 2024-02-23 NOTE — Telephone Encounter (Signed)
 Patient is aware of the approval for the Mounjaro .

## 2024-02-27 ENCOUNTER — Other Ambulatory Visit (HOSPITAL_COMMUNITY): Payer: Self-pay

## 2024-03-05 ENCOUNTER — Telehealth: Payer: Self-pay

## 2024-03-05 NOTE — Telephone Encounter (Signed)
 Good Afternoon, Susan Hubbard and the pharmacist states it is no good because it has been opened and exposed to germs. Per Susan Hubbard you need to throw this one in the trash. Have a good day! Susan Hubbard, CMA

## 2024-03-05 NOTE — Telephone Encounter (Signed)
 Called Patient to schedule a mounjaro  teaching and she has to work during our availability for the next week. Patient states she will try to give it to her self one more time and if she can not do it then she will schedule an appointment.

## 2024-03-05 NOTE — Telephone Encounter (Signed)
 That would be fine

## 2024-03-05 NOTE — Telephone Encounter (Unsigned)
 Copied from CRM [id number]. Topic: Clinical - Medication Question >> Mar 05, 2024  1:21 PM Taleah C wrote: Reason for CRM: pt called and stated that she would like one of the nurses to help her inject her Mounjaro . Please advise.

## 2024-03-05 NOTE — Telephone Encounter (Signed)
 Is it okay to schedule nurse visit for Mounjaro  teaching?

## 2024-03-13 ENCOUNTER — Ambulatory Visit (INDEPENDENT_AMBULATORY_CARE_PROVIDER_SITE_OTHER): Admitting: Podiatry

## 2024-03-13 DIAGNOSIS — M722 Plantar fascial fibromatosis: Secondary | ICD-10-CM

## 2024-03-13 DIAGNOSIS — B353 Tinea pedis: Secondary | ICD-10-CM | POA: Diagnosis not present

## 2024-03-13 MED ORDER — TERBINAFINE HCL 250 MG PO TABS: 250.0000 mg | ORAL_TABLET | Freq: Every day | ORAL | 0 refills | Status: AC

## 2024-03-13 NOTE — Progress Notes (Signed)
 Subjective:  Patient ID: Susan Hubbard, female    DOB: 02-01-1991,  MRN: [id number] HPI Chief Complaint  Patient presents with   Tinea Pedis    Since Sunday, very raw between 2nd and 3rd toes left    33 y.o. female presents with the above complaint.   ROS: Denies fever chills (muscle aches pains calf pain back pain chest pain shortness of breath.  Not pregnant not breast-feeding.  Past Medical History:  Diagnosis Date   Anemia    Bacterial vaginosis 11/11/2015   History of   Blood transfusion without reported diagnosis 9/23   Chlamydia    Chronic kidney disease    h/o kidney stones   Family history of adverse reaction to anesthesia    mom unsure of what   GERD (gastroesophageal reflux disease)    Gestational diabetes    Hyperlipidemia    Irregular menses    Oligomenorrhea    PCOS (polycystic ovarian syndrome)    PCOS (polycystic ovarian syndrome)    Vaccine for human papilloma virus (HPV) types 6, 11, 16, and 18 administered    Vitamin D deficiency    Vitamin D deficiency    Past Surgical History:  Procedure Laterality Date   CESAREAN SECTION     CESAREAN SECTION N/A 02/06/2021   Procedure: CESAREAN SECTION;  Surgeon: Lorence Ozell CROME, MD;  Location: [facility];  Service: Obstetrics;  Laterality: N/A;   HYSTEROSCOPY WITH D & C N/A 10/20/2015   Procedure: DILATATION AND CURETTAGE /HYSTEROSCOPY;  Surgeon: Glory High, MD;  Location: [facility] ORS;  Service: Gynecology;  Laterality: N/A;    Current Outpatient Medications:    ketoconazole (NIZORAL) 2 % cream, Apply topically 2 (two) times daily., Disp: , Rfl:    spironolactone (ALDACTONE) 100 MG tablet, Take 100 mg by mouth daily., Disp: , Rfl:    terbinafine  (LAMISIL ) 250 MG tablet, Take 1 tablet (250 mg total) by mouth daily., Disp: 30 tablet, Rfl: 0   triamcinolone ointment (KENALOG) 0.1 %, Apply 1 Application topically 2 (two) times daily., Disp: , Rfl:    metroNIDAZOLE  (FLAGYL ) 500 MG tablet, Take 1 tablet (500 mg  total) by mouth 2 (two) times daily. (Patient not taking: Reported on 01/18/2024), Disp: 14 tablet, Rfl: 0   omeprazole  (PRILOSEC) 20 MG capsule, TAKE 1 TABLET 30 MINUTES BEFORE THE BREAKFAST., Disp: 90 capsule, Rfl: 1   tirzepatide  (MOUNJARO ) 2.5 MG/0.5ML Pen, Inject 2.5 mg into the skin once a week., Disp: 2 mL, Rfl: 1   tirzepatide  (MOUNJARO ) 2.5 MG/0.5ML Pen, Inject 2.5 mg into the skin once a week., Disp: 2 mL, Rfl: 1  Allergies  Allergen Reactions   Augmentin [Amoxicillin-Pot Clavulanate] Nausea And Vomiting    Has patient had a PCN reaction causing immediate rash, facial/tongue/throat swelling, SOB or lightheadedness with hypotension: no Has patient had a PCN reaction causing severe rash involving mucus membranes or skin necrosis: unknown Has patient had a PCN reaction that required hospitalization no Has patient had a PCN reaction occurring within the last 10 years: yes If all of the above answers are NO, then may proceed with Cephalosporin use.    Review of Systems Objective:  There were no vitals filed for this visit.  General: Well developed, nourished, in no acute distress, alert and oriented x3   Dermatological: Skin is warm, dry and supple bilateral. Nails x 10 are well maintained; remaining integument appears unremarkable at this time.  There are no open sores however she does have interdigital tinea pedis with  maceration between toes left foot greater than the right she does have petechial lesions on the toes and the forefoot bilateral. Vascular: Dorsalis Pedis artery and Posterior Tibial artery pedal pulses are 2/4 bilateral with immedate capillary fill time. Pedal hair growth present. No varicosities and no lower extremity edema present bilateral.   Neruologic: Grossly intact via light touch bilateral. Vibratory intact via tuning fork bilateral. Protective threshold with Semmes Wienstein monofilament intact to all pedal sites bilateral. Patellar and Achilles deep tendon  reflexes 2+ bilateral. No Babinski or clonus noted bilateral.   Musculoskeletal: No gross boney pedal deformities bilateral. No pain, crepitus, or limitation noted with foot and ankle range of motion bilateral. Muscular strength 5/5 in all groups tested bilateral.  Gait: Unassisted, Nonantalgic.    Radiographs:  None taken  Assessment & Plan:   Assessment: InterDigital tinea pedis bilaterally  Plan: Discussed appropriate drying habits and use of alcohol between her toes.  Also started her on Lamisil  250 mg tablets.  She will take 1 tablet daily for the next 30 days I will follow-up with her in 1 month.  Discussed the possible side effects associated with this she understands this minimal to follow-up with me at that time.     Anysa Tacey T. [place], [place]

## 2024-03-14 DIAGNOSIS — L700 Acne vulgaris: Secondary | ICD-10-CM | POA: Diagnosis not present

## 2024-03-14 DIAGNOSIS — L218 Other seborrheic dermatitis: Secondary | ICD-10-CM | POA: Diagnosis not present

## 2024-03-21 ENCOUNTER — Ambulatory Visit: Admitting: Cardiology

## 2024-04-04 ENCOUNTER — Encounter (HOSPITAL_BASED_OUTPATIENT_CLINIC_OR_DEPARTMENT_OTHER): Payer: Self-pay | Admitting: Internal Medicine

## 2024-04-04 DIAGNOSIS — G4710 Hypersomnia, unspecified: Secondary | ICD-10-CM

## 2024-04-10 ENCOUNTER — Ambulatory Visit: Admitting: Podiatry

## 2024-04-29 ENCOUNTER — Ambulatory Visit: Admitting: Cardiology

## 2024-05-08 DIAGNOSIS — E119 Type 2 diabetes mellitus without complications: Secondary | ICD-10-CM | POA: Diagnosis not present

## 2024-05-08 DIAGNOSIS — Z7689 Persons encountering health services in other specified circumstances: Secondary | ICD-10-CM | POA: Diagnosis not present

## 2024-05-08 DIAGNOSIS — Z0000 Encounter for general adult medical examination without abnormal findings: Secondary | ICD-10-CM | POA: Diagnosis not present

## 2024-05-08 DIAGNOSIS — E7800 Pure hypercholesterolemia, unspecified: Secondary | ICD-10-CM | POA: Diagnosis not present

## 2024-05-20 ENCOUNTER — Encounter: Payer: Self-pay | Admitting: Cardiology

## 2024-05-20 ENCOUNTER — Ambulatory Visit: Attending: Cardiology | Admitting: Cardiology

## 2024-05-20 VITALS — BP 110/68 | HR 75 | Ht 63.0 in | Wt 256.0 lb

## 2024-05-20 DIAGNOSIS — R072 Precordial pain: Secondary | ICD-10-CM

## 2024-05-20 MED ORDER — METOPROLOL TARTRATE 100 MG PO TABS: ORAL_TABLET | ORAL | 0 refills | Status: AC

## 2024-05-20 NOTE — Progress Notes (Signed)
 Cardiology Office Note:    Date:  05/20/2024   ID:  Susan Hubbard, DOB 1991-01-14, MRN [id number]  PCP:  Vincente Saber, NP   [facility] Providers Cardiologist:  None     Referring MD: Vincente Saber, NP   Chief Complaint  Patient presents with   New Patient (Initial Visit)    follow up pt has been doing well with no complaints of chest pain, or SOB, The patient report some chest pressure x 2 days. medciation reviewed verbally with patient   Susan Hubbard is a 33 y.o. female who is being seen today for the evaluation of chest pain at the request of Vincente Saber, NP.  History of Present Illness:    Susan Hubbard is a 33 y.o. female with a hx of GERD, obesity, former smoker x 6 years PCOS presenting with chest pain.  Endorses chest pain ongoing over the past year or so.  Symptoms are not associated with exertion, usually occurring at rest.  Symptoms are not related with food intake.  Endorse having occasional heartburns, followed up with primary care physician for prescribed omeprazole , patient states not taking omeprazole  daily as prescribed.  Denies any immediate family history of heart disease.  Smoked in the past for about 6 to 10 years.  Past Medical History:  Diagnosis Date   Anemia    Bacterial vaginosis 11/11/2015   History of   Blood transfusion without reported diagnosis 9/23   Chlamydia    Chronic kidney disease    h/o kidney stones   Family history of adverse reaction to anesthesia    mom unsure of what   GERD (gastroesophageal reflux disease)    Gestational diabetes    Hyperlipidemia    Irregular menses    Oligomenorrhea    PCOS (polycystic ovarian syndrome)    PCOS (polycystic ovarian syndrome)    Vaccine for human papilloma virus (HPV) types 6, 11, 16, and 18 administered    Vitamin D deficiency    Vitamin D deficiency     Past Surgical History:  Procedure Laterality Date   CESAREAN SECTION     CESAREAN SECTION N/A  02/06/2021   Procedure: CESAREAN SECTION;  Surgeon: Lorence Ozell CROME, MD;  Location: [facility] ORS;  Service: Obstetrics;  Laterality: N/A;   HYSTEROSCOPY WITH D & C N/A 10/20/2015   Procedure: DILATATION AND CURETTAGE /HYSTEROSCOPY;  Surgeon: Glory High, MD;  Location: [facility] ORS;  Service: Gynecology;  Laterality: N/A;    Current Medications: Current Meds  Medication Sig   ketoconazole (NIZORAL) 2 % cream Apply topically 2 (two) times daily.   metoprolol  tartrate (LOPRESSOR ) 100 MG tablet TAKE 1 TABLET 2 HR PRIOR TO CARDIAC PROCEDURE   omeprazole  (PRILOSEC) 20 MG capsule TAKE 1 TABLET 30 MINUTES BEFORE THE BREAKFAST.   spironolactone (ALDACTONE) 100 MG tablet Take 100 mg by mouth daily.   tirzepatide  (MOUNJARO ) 2.5 MG/0.5ML Pen Inject 2.5 mg into the skin once a week.   tirzepatide  (MOUNJARO ) 2.5 MG/0.5ML Pen Inject 2.5 mg into the skin once a week.   triamcinolone ointment (KENALOG) 0.1 % Apply 1 Application topically 2 (two) times daily.     Allergies:   Augmentin [amoxicillin-pot clavulanate]   Social History   Socioeconomic History   Marital status: Significant Other    Spouse name: Not on file   Number of children: Not on file   Years of education: Not on file   Highest education level: Associate degree: academic program  Occupational History  Not on file  Tobacco Use   Smoking status: Former    Current packs/day: 0.00    Average packs/day: 0.3 packs/day for 10.0 years (2.5 ttl pk-yrs)    Types: Cigarettes    Quit date: 11/22/2019    Years since quitting: 4.4   Smokeless tobacco: Never   Tobacco comments:    quit 2021, smoked for 15 year about a pack a day  Vaping Use   Vaping status: Never Used  Substance and Sexual Activity   Alcohol use: No   Drug use: No   Sexual activity: Yes    Partners: Male    Birth control/protection: None  Other Topics Concern   Not on file  Social History Narrative   Lives with mother.   Have two kids girls: 25 and 22  year old.     Social Drivers of Corporate Investment Banker Strain: Low Risk  (05/07/2024)   Received from [facility]   Overall Financial Resource Strain (CARDIA)    Difficulty of Paying Living Expenses: Not hard at all  Food Insecurity: No Food Insecurity (05/07/2024)   Received from [facility]   Hunger Vital Sign    Within the past 12 months, you worried that your food would run out before you got the money to buy more.: Never true    Within the past 12 months, the food you bought just didn't last and you didn't have money to get more.: Never true  Transportation Needs: No Transportation Needs (05/07/2024)   Received from [facility] - Transportation    In the past 12 months, has lack of transportation kept you from medical appointments or from getting medications?: No    Lack of Transportation (Non-Medical): No  Physical Activity: Sufficiently Active (09/01/2023)   Exercise Vital Sign    Days of Exercise per Week: 6 days    Minutes of Exercise per Session: 40 min  Stress: No Stress Concern Present (09/01/2023)   [employer] of Occupational Health - Occupational Stress Questionnaire    Feeling of Stress : Only a little  Social Connections: Socially Isolated (09/01/2023)   Social Connection and Isolation Panel    Frequency of Communication with Friends and Family: More than three times a week    Frequency of Social Gatherings with Friends and Family: Never    Attends Religious Services: Never    Diplomatic Services Operational Officer: No    Attends Engineer, Structural: Not on file    Marital Status: Never married     Family History: The patient's family history includes Arthritis in her maternal grandmother; Asthma in her maternal grandmother and mother; Brain cancer in her paternal grandmother; COPD in her mother; Cancer in her maternal grandfather, paternal grandfather, and paternal grandmother; Cataracts in  her paternal grandfather; Diabetes in her maternal grandmother, mother, and paternal grandfather; GER disease in her mother; Hyperlipidemia in her maternal grandmother, mother, and paternal grandfather; Hypertension in her father, mother, and paternal grandmother; Lung cancer in her paternal grandmother; Obesity in her maternal grandmother and mother; Sleep apnea in her mother; Stroke in her maternal uncle.  ROS:   Please see the history of present illness.     All other systems reviewed and are negative.  EKGs/Labs/Other Studies Reviewed:    The following studies were reviewed today:  EKG Interpretation Date/Time:  Monday May 20 2024 08:38:43 EST Ventricular Rate:  75 PR Interval:  184 QRS Duration:  76 QT Interval:  374 QTC Calculation: 417 R Axis:   13  Text Interpretation: Normal sinus rhythm Normal ECG Confirmed by Darliss Rogue ([id number]) on 05/20/2024 8:47:05 AM    Recent Labs: 02/15/2024: ALT 18; BUN 14; Creatinine, Ser 0.79; Hemoglobin 12.6; Platelets 329.0; Potassium 4.1; Sodium 140; TSH 0.73  Recent Lipid Panel    Component Value Date/Time   CHOL 173 02/15/2024 0734   CHOL 178 01/18/2024 1520   TRIG 127.0 02/15/2024 0734   HDL 30.00 (L) 02/15/2024 0734   HDL 32 (L) 01/18/2024 1520   CHOLHDL 6 02/15/2024 0734   VLDL 25.4 02/15/2024 0734   LDLCALC 118 (H) 02/15/2024 0734   LDLCALC 93 01/18/2024 1520     Risk Assessment/Calculations:             Physical Exam:    VS:  BP 110/68 (BP Location: Right Arm, Patient Position: Sitting, Cuff Size: Large)   Pulse 75   Ht 5' 3 (1.6 m)   Wt 256 lb (116.1 kg)   SpO2 98%   BMI 45.35 kg/m     Wt Readings from Last 3 Encounters:  05/20/24 256 lb (116.1 kg)  01/18/24 263 lb (119.3 kg)  09/04/23 255 lb 4.8 oz (115.8 kg)     GEN:  Well nourished, well developed in no acute distress HEENT: Normal NECK: No JVD; No carotid bruits CARDIAC: RRR, no murmurs, rubs, gallops RESPIRATORY:  Clear to auscultation  without rales, wheezing or rhonchi  ABDOMEN: Soft, non-tender, non-distended MUSCULOSKELETAL:  No edema; No deformity  SKIN: Warm and dry NEUROLOGIC:  Alert and oriented x 3 PSYCHIATRIC:  Normal affect   ASSESSMENT:    1. Precordial pain   2. Morbid obesity (HCC)    PLAN:    In order of problems listed above:  Chest pain, atypical.  Risk factors prior smoking, obesity.  Obtain echo, coronary CTA to evaluate CAD.  Reflux/heartburn also in differential.  Advised to take omeprazole  20 mg daily as prescribed. Morbid obesity, agree with Mounjaro .  Low-calorie diet, weight loss advised.  Follow-up after cardiac testing.      Medication Adjustments/Labs and Tests Ordered: Current medicines are reviewed at length with the patient today.  Concerns regarding medicines are outlined above.  Orders Placed This Encounter  Procedures   CT CORONARY MORPH W/CTA COR W/SCORE W/CA W/CM &/OR WO/CM   Basic metabolic panel with GFR   EKG 87-Ozji   ECHOCARDIOGRAM COMPLETE   Meds ordered this encounter  Medications   metoprolol  tartrate (LOPRESSOR ) 100 MG tablet    Sig: TAKE 1 TABLET 2 HR PRIOR TO CARDIAC PROCEDURE    Dispense:  1 tablet    Refill:  0    Patient Instructions  Medication Instructions:  - take 100 mg of metoprolol  two hours prior to cardiac CT  *If you need a refill on your cardiac medications before your next appointment, please call your pharmacy*  Lab Work: Your provider would like for you to have following labs drawn today BMP.   If you have labs (blood work) drawn today and your tests are completely normal, you will receive your results only by: MyChart Message (if you have MyChart) OR A paper copy in the mail If you have any lab test that is abnormal or we need to change your treatment, we will call you to review the results.  Testing/Procedures: Your physician has requested that you have an echocardiogram. Echocardiography is a painless test that uses sound waves to  create images of  your heart. It provides your doctor with information about the size and shape of your heart and how well your heart's chambers and valves are working.   You may receive an ultrasound enhancing agent through an IV if needed to better visualize your heart during the echo. This procedure takes approximately one hour.  There are no restrictions for this procedure.  This will take place at [address] [facility] [address], [place] [address]  Please note: We ask at that you not bring children with you during ultrasound (echo/ vascular) testing. Due to room size and safety concerns, children are not allowed in the ultrasound rooms during exams. Our front office staff cannot provide observation of children in our lobby area while testing is being conducted. An adult accompanying a patient to their appointment will only be allowed in the ultrasound room at the discretion of the ultrasound technician under special circumstances. We apologize for any inconvenience.   Your cardiac CT will be scheduled at:  [facility] [address] [place], [place] [address] [phone]  Please arrive 15 mins early for check-in and test prep.  There is spacious parking and easy access to the radiology department from the [facility] and Vascular entrance. Please enter here and check-in with the desk attendant.    Please follow these instructions carefully (unless otherwise directed):  An IV will be required for this test and Nitroglycerin will be given.  Hold all erectile dysfunction medications at least 3 days (72 hrs) prior to test. (Ie viagra, cialis, sildenafil, tadalafil, etc)     On the Night Before the Test: Be sure to Drink plenty of water. Do not consume any caffeinated/decaffeinated beverages or chocolate 12 hours prior to your test. Do not take any antihistamines 12 hours prior to your test.  On the Day of the Test: Drink plenty of water  until 1 hour prior to the test. Do not eat any food 1 hour prior to test. You may take your regular medications prior to the test.  Take metoprolol  (Lopressor ) two hours prior to test. If you take Furosemide/Hydrochlorothiazide/Spironolactone/Chlorthalidone, please HOLD on the morning of the test. Patients who wear a continuous glucose monitor MUST remove the device prior to scanning. FEMALES- please wear underwire-free bra if available, avoid dresses & tight clothing       After the Test: Drink plenty of water. After receiving IV contrast, you may experience a mild flushed feeling. This is normal. On occasion, you may experience a mild rash up to 24 hours after the test. This is not dangerous. If this occurs, you can take Benadryl  25 mg, Zyrtec, Claritin, or Allegra and increase your fluid intake. (Patients taking Tikosyn should avoid Benadryl , and may take Zyrtec, Claritin, or Allegra) If you experience trouble breathing, this can be serious. If it is severe call 911 IMMEDIATELY. If it is mild, please call our office.  We will call to schedule your test 2-4 weeks out understanding that some insurance companies will need an authorization prior to the service being performed.   For more information and frequently asked questions, please visit our website : [email or web address]  For non-scheduling related questions, please contact the cardiac imaging nurse navigator should you have any questions/concerns: Cardiac Imaging Nurse Navigators Direct Office Dial: [phone]   For scheduling needs, including cancellations and rescheduling, please call [place], [phone].    Follow-Up: At [facility], you and your health needs are our priority.  As part of our continuing  mission to provide you with exceptional heart care, our providers are all part of one team.  This team includes your primary Cardiologist (physician) and Advanced Practice Providers or APPs (Physician  Assistants and Nurse Practitioners) who all work together to provide you with the care you need, when you need it.  Your next appointment:   3 month(s)  Provider:   You may see Dr Darliss or one of the following Advanced Practice Providers on your designated Care Team:   Lonni Meager, NP Lesley Maffucci, PA-C Bernardino Bring, PA-C Cadence [place], PA-C Tylene Lunch, NP Barnie Hila, NP    We recommend signing up for the patient portal called MyChart.  Sign up information is provided on this After Visit Summary.  MyChart is used to connect with patients for Virtual Visits (Telemedicine).  Patients are able to view lab/test results, encounter notes, upcoming appointments, etc.  Non-urgent messages can be sent to your provider as well.   To learn more about what you can do with MyChart, go to [email or web address].              Signed, Redell Darliss, MD  05/20/2024 10:00 AM    [facility]

## 2024-05-20 NOTE — Patient Instructions (Signed)
 Medication Instructions:  - take 100 mg of metoprolol  two hours prior to cardiac CT *If you need a refill on your cardiac medications before your next appointment, please call your pharmacy*  Lab Work: Your provider would like for you to have following labs drawn today BMP.   If you have labs (blood work) drawn today and your tests are completely normal, you will receive your results only by: MyChart Message (if you have MyChart) OR A paper copy in the mail If you have any lab test that is abnormal or we need to change your treatment, we will call you to review the results.  Testing/Procedures: Your physician has requested that you have an echocardiogram. Echocardiography is a painless test that uses sound waves to create images of your heart. It provides your doctor with information about the size and shape of your heart and how well your heart's chambers and valves are working.   You may receive an ultrasound enhancing agent through an IV if needed to better visualize your heart during the echo. This procedure takes approximately one hour.  There are no restrictions for this procedure.  This will take place at [address] [facility] [address]) [address], [place] [address]  Please note: We ask at that you not bring children with you during ultrasound (echo/ vascular) testing. Due to room size and safety concerns, children are not allowed in the ultrasound rooms during exams. Our front office staff cannot provide observation of children in our lobby area while testing is being conducted. An adult accompanying a patient to their appointment will only be allowed in the ultrasound room at the discretion of the ultrasound technician under special circumstances. We apologize for any inconvenience.   Your cardiac CT will be scheduled at:  [facility] [address] [place], [place] [address] [phone]  Please arrive 15 mins early for check-in and test  prep.  There is spacious parking and easy access to the radiology department from the [facility] Heart and Vascular entrance. Please enter here and check-in with the desk attendant.    Please follow these instructions carefully (unless otherwise directed):  An IV will be required for this test and Nitroglycerin will be given.  Hold all erectile dysfunction medications at least 3 days (72 hrs) prior to test. (Ie viagra, cialis, sildenafil, tadalafil, etc)     On the Night Before the Test: Be sure to Drink plenty of water. Do not consume any caffeinated/decaffeinated beverages or chocolate 12 hours prior to your test. Do not take any antihistamines 12 hours prior to your test.  On the Day of the Test: Drink plenty of water until 1 hour prior to the test. Do not eat any food 1 hour prior to test. You may take your regular medications prior to the test.  Take metoprolol  (Lopressor ) two hours prior to test. If you take Furosemide/Hydrochlorothiazide/Spironolactone/Chlorthalidone, please HOLD on the morning of the test. Patients who wear a continuous glucose monitor MUST remove the device prior to scanning. FEMALES- please wear underwire-free bra if available, avoid dresses & tight clothing       After the Test: Drink plenty of water. After receiving IV contrast, you may experience a mild flushed feeling. This is normal. On occasion, you may experience a mild rash up to 24 hours after the test. This is not dangerous. If this occurs, you can take Benadryl  25 mg, Zyrtec, Claritin, or Allegra and increase your fluid intake. (Patients taking Tikosyn should avoid Benadryl ,  and may take Zyrtec, Claritin, or Allegra) If you experience trouble breathing, this can be serious. If it is severe call 911 IMMEDIATELY. If it is mild, please call our office.  We will call to schedule your test 2-4 weeks out understanding that some insurance companies will need an authorization prior to the service being  performed.   For more information and frequently asked questions, please visit our website : [email or web address]  For non-scheduling related questions, please contact the cardiac imaging nurse navigator should you have any questions/concerns: Cardiac Imaging Nurse Navigators Direct Office Dial: [phone]   For scheduling needs, including cancellations and rescheduling, please call [place], [phone].    Follow-Up: At [facility], you and your health needs are our priority.  As part of our continuing mission to provide you with exceptional heart care, our providers are all part of one team.  This team includes your primary Cardiologist (physician) and Advanced Practice Providers or APPs (Physician Assistants and Nurse Practitioners) who all work together to provide you with the care you need, when you need it.  Your next appointment:   3 month(s)  Provider:   You may see Dr. Darliss or one of the following Advanced Practice Providers on your designated Care Team:   Lonni Meager, NP Lesley Maffucci, PA-C Bernardino Bring, PA-C Cadence Hindman, PA-C Tylene Lunch, NP Barnie Hila, NP    We recommend signing up for the patient portal called MyChart.  Sign up information is provided on this After Visit Summary.  MyChart is used to connect with patients for Virtual Visits (Telemedicine).  Patients are able to view lab/test results, encounter notes, upcoming appointments, etc.  Non-urgent messages can be sent to your provider as well.   To learn more about what you can do with MyChart, go to [email or web address].

## 2024-05-21 LAB — BASIC METABOLIC PANEL WITH GFR
BUN/Creatinine Ratio: 13 (ref 9–23)
BUN: 11 mg/dL (ref 6–20)
CO2: 24 mmol/L (ref 20–29)
Calcium: 9.5 mg/dL (ref 8.7–10.2)
Chloride: 102 mmol/L (ref 96–106)
Creatinine, Ser: 0.88 mg/dL (ref 0.57–1.00)
Glucose: 78 mg/dL (ref 70–99)
Potassium: 4.5 mmol/L (ref 3.5–5.2)
Sodium: 139 mmol/L (ref 134–144)
eGFR: 88 mL/min/1.73 (ref >59)

## 2024-06-21 ENCOUNTER — Other Ambulatory Visit: Payer: Self-pay

## 2024-06-21 MED ORDER — MOUNJARO 2.5 MG/0.5ML ~~LOC~~ SOAJ
0.5000 mL | SUBCUTANEOUS | 0 refills | Status: AC
  Filled 2024-06-21 – 2024-07-25 (×3): qty 2, 28d supply, fill #0

## 2024-06-25 ENCOUNTER — Other Ambulatory Visit (HOSPITAL_COMMUNITY): Payer: Self-pay

## 2024-06-27 ENCOUNTER — Encounter: Admitting: Dietician

## 2024-07-04 ENCOUNTER — Other Ambulatory Visit: Payer: Self-pay

## 2024-07-08 ENCOUNTER — Ambulatory Visit: Attending: Cardiology

## 2024-07-19 ENCOUNTER — Other Ambulatory Visit (HOSPITAL_COMMUNITY): Payer: Self-pay

## 2024-07-19 ENCOUNTER — Other Ambulatory Visit: Payer: Self-pay

## 2024-07-22 ENCOUNTER — Encounter: Payer: Self-pay | Admitting: Pharmacist

## 2024-07-22 ENCOUNTER — Other Ambulatory Visit: Payer: Self-pay

## 2024-07-25 ENCOUNTER — Other Ambulatory Visit: Payer: Self-pay

## 2024-07-25 ENCOUNTER — Encounter (HOSPITAL_COMMUNITY): Payer: Self-pay

## 2024-07-25 ENCOUNTER — Other Ambulatory Visit (HOSPITAL_COMMUNITY): Payer: Self-pay

## 2024-07-29 ENCOUNTER — Other Ambulatory Visit (HOSPITAL_COMMUNITY): Payer: Self-pay

## 2024-07-29 ENCOUNTER — Encounter: Payer: Self-pay | Admitting: Pharmacist

## 2024-07-29 ENCOUNTER — Other Ambulatory Visit: Payer: Self-pay

## 2024-08-14 ENCOUNTER — Ambulatory Visit (HOSPITAL_BASED_OUTPATIENT_CLINIC_OR_DEPARTMENT_OTHER): Admitting: Pulmonary Disease

## 2024-08-20 ENCOUNTER — Ambulatory Visit: Admitting: Cardiology

## 2024-09-02 ENCOUNTER — Ambulatory Visit (HOSPITAL_BASED_OUTPATIENT_CLINIC_OR_DEPARTMENT_OTHER): Admitting: Pulmonary Disease

## 2024-09-23 ENCOUNTER — Ambulatory Visit
